# Patient Record
Sex: Female | Born: 1975
Health system: Southern US, Community
[De-identification: ages and names within clinical notes are randomized; demographics above are authoritative.]

## PROBLEM LIST (undated history)

## (undated) DIAGNOSIS — H269 Unspecified cataract: Secondary | ICD-10-CM

## (undated) DIAGNOSIS — K589 Irritable bowel syndrome without diarrhea: Secondary | ICD-10-CM

## (undated) DIAGNOSIS — L309 Dermatitis, unspecified: Secondary | ICD-10-CM

## (undated) DIAGNOSIS — Z923 Personal history of irradiation: Secondary | ICD-10-CM

## (undated) DIAGNOSIS — E739 Lactose intolerance, unspecified: Secondary | ICD-10-CM

## (undated) DIAGNOSIS — H5010 Unspecified exotropia: Secondary | ICD-10-CM

## (undated) HISTORY — PX: WISDOM TOOTH EXTRACTION: SHX21

## (undated) HISTORY — DX: Unspecified cataract: H26.9

## (undated) HISTORY — DX: Lactose intolerance, unspecified: E73.9

---
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
## 1999-02-09 ENCOUNTER — Encounter: Payer: Self-pay | Admitting: Emergency Medicine

## 1999-02-09 ENCOUNTER — Emergency Department (HOSPITAL_COMMUNITY): Admission: EM | Admit: 1999-02-09 | Discharge: 1999-02-09 | Payer: Self-pay

## 1999-09-21 ENCOUNTER — Other Ambulatory Visit: Admission: RE | Admit: 1999-09-21 | Discharge: 1999-09-21 | Payer: Self-pay | Admitting: Obstetrics and Gynecology

## 2000-05-10 ENCOUNTER — Encounter: Payer: Self-pay | Admitting: Emergency Medicine

## 2000-05-10 ENCOUNTER — Emergency Department (HOSPITAL_COMMUNITY): Admission: EM | Admit: 2000-05-10 | Discharge: 2000-05-10 | Payer: Self-pay | Admitting: Emergency Medicine

## 2000-10-12 ENCOUNTER — Emergency Department (HOSPITAL_COMMUNITY): Admission: EM | Admit: 2000-10-12 | Discharge: 2000-10-12 | Payer: Self-pay | Admitting: Emergency Medicine

## 2002-01-09 ENCOUNTER — Emergency Department (HOSPITAL_COMMUNITY): Admission: EM | Admit: 2002-01-09 | Discharge: 2002-01-09 | Payer: Self-pay | Admitting: Emergency Medicine

## 2002-01-09 ENCOUNTER — Encounter: Payer: Self-pay | Admitting: Emergency Medicine

## 2002-05-12 ENCOUNTER — Encounter: Payer: Self-pay | Admitting: Emergency Medicine

## 2002-05-12 ENCOUNTER — Emergency Department (HOSPITAL_COMMUNITY): Admission: EM | Admit: 2002-05-12 | Discharge: 2002-05-12 | Payer: Self-pay | Admitting: Emergency Medicine

## 2002-08-29 ENCOUNTER — Ambulatory Visit (HOSPITAL_COMMUNITY): Admission: RE | Admit: 2002-08-29 | Discharge: 2002-08-29 | Payer: Self-pay | Admitting: Obstetrics & Gynecology

## 2002-08-29 ENCOUNTER — Encounter: Payer: Self-pay | Admitting: Obstetrics & Gynecology

## 2002-10-03 ENCOUNTER — Inpatient Hospital Stay (HOSPITAL_COMMUNITY): Admission: AD | Admit: 2002-10-03 | Discharge: 2002-10-04 | Payer: Self-pay | Admitting: *Deleted

## 2002-10-03 ENCOUNTER — Encounter: Payer: Self-pay | Admitting: Obstetrics

## 2002-10-11 ENCOUNTER — Inpatient Hospital Stay (HOSPITAL_COMMUNITY): Admission: AD | Admit: 2002-10-11 | Discharge: 2002-10-11 | Payer: Self-pay | Admitting: Obstetrics

## 2002-10-12 ENCOUNTER — Inpatient Hospital Stay (HOSPITAL_COMMUNITY): Admission: AD | Admit: 2002-10-12 | Discharge: 2002-10-12 | Payer: Self-pay | Admitting: Obstetrics

## 2002-11-29 ENCOUNTER — Encounter: Payer: Self-pay | Admitting: Obstetrics & Gynecology

## 2002-11-29 ENCOUNTER — Inpatient Hospital Stay (HOSPITAL_COMMUNITY): Admission: RE | Admit: 2002-11-29 | Discharge: 2002-12-03 | Payer: Self-pay | Admitting: Obstetrics & Gynecology

## 2002-12-01 ENCOUNTER — Encounter (INDEPENDENT_AMBULATORY_CARE_PROVIDER_SITE_OTHER): Payer: Self-pay | Admitting: Specialist

## 2002-12-19 ENCOUNTER — Emergency Department (HOSPITAL_COMMUNITY): Admission: EM | Admit: 2002-12-19 | Discharge: 2002-12-20 | Payer: Self-pay | Admitting: *Deleted

## 2005-01-17 ENCOUNTER — Inpatient Hospital Stay (HOSPITAL_COMMUNITY): Admission: AD | Admit: 2005-01-17 | Discharge: 2005-01-19 | Payer: Self-pay | Admitting: Obstetrics & Gynecology

## 2005-01-17 ENCOUNTER — Encounter (INDEPENDENT_AMBULATORY_CARE_PROVIDER_SITE_OTHER): Payer: Self-pay | Admitting: Specialist

## 2005-01-17 ENCOUNTER — Ambulatory Visit: Payer: Self-pay | Admitting: Obstetrics & Gynecology

## 2006-05-20 ENCOUNTER — Encounter (INDEPENDENT_AMBULATORY_CARE_PROVIDER_SITE_OTHER): Payer: Self-pay | Admitting: *Deleted

## 2006-05-20 ENCOUNTER — Ambulatory Visit (HOSPITAL_COMMUNITY): Admission: RE | Admit: 2006-05-20 | Discharge: 2006-05-20 | Payer: Self-pay | Admitting: Obstetrics

## 2006-05-20 HISTORY — PX: LEEP: SHX91

## 2009-01-22 ENCOUNTER — Encounter: Payer: Self-pay | Admitting: Gastroenterology

## 2009-01-29 ENCOUNTER — Encounter: Payer: Self-pay | Admitting: Gastroenterology

## 2009-01-29 ENCOUNTER — Ambulatory Visit (HOSPITAL_COMMUNITY): Admission: RE | Admit: 2009-01-29 | Discharge: 2009-01-29 | Payer: Self-pay | Admitting: Obstetrics

## 2009-02-10 DIAGNOSIS — N83209 Unspecified ovarian cyst, unspecified side: Secondary | ICD-10-CM | POA: Insufficient documentation

## 2009-02-10 DIAGNOSIS — K5900 Constipation, unspecified: Secondary | ICD-10-CM | POA: Insufficient documentation

## 2009-02-11 ENCOUNTER — Ambulatory Visit: Payer: Self-pay | Admitting: Gastroenterology

## 2009-02-11 DIAGNOSIS — E739 Lactose intolerance, unspecified: Secondary | ICD-10-CM | POA: Insufficient documentation

## 2009-02-11 DIAGNOSIS — R1084 Generalized abdominal pain: Secondary | ICD-10-CM | POA: Insufficient documentation

## 2009-02-11 DIAGNOSIS — K589 Irritable bowel syndrome without diarrhea: Secondary | ICD-10-CM | POA: Insufficient documentation

## 2009-02-11 LAB — CONVERTED CEMR LAB
ALT: 23 units/L (ref 0–35)
AST: 20 units/L (ref 0–37)
Basophils Absolute: 0.1 10*3/uL (ref 0.0–0.1)
Basophils Relative: 0.9 % (ref 0.0–3.0)
Bilirubin, Direct: 0 mg/dL (ref 0.0–0.3)
CO2: 28 meq/L (ref 19–32)
Chloride: 109 meq/L (ref 96–112)
Eosinophils Absolute: 0.5 10*3/uL (ref 0.0–0.7)
Hemoglobin: 13.1 g/dL (ref 12.0–15.0)
Iron: 59 ug/dL (ref 42–145)
Lymphocytes Relative: 20.6 % (ref 12.0–46.0)
MCHC: 34.3 g/dL (ref 30.0–36.0)
Monocytes Relative: 7.3 % (ref 3.0–12.0)
Neutro Abs: 4.8 10*3/uL (ref 1.4–7.7)
Neutrophils Relative %: 65 % (ref 43.0–77.0)
Potassium: 4 meq/L (ref 3.5–5.1)
RBC: 4.06 M/uL (ref 3.87–5.11)
Saturation Ratios: 17.7 % — ABNORMAL LOW (ref 20.0–50.0)
Sed Rate: 12 mm/hr (ref 0–22)
Total Bilirubin: 0.8 mg/dL (ref 0.3–1.2)
Total Protein: 7.4 g/dL (ref 6.0–8.3)
Transferrin: 237.7 mg/dL (ref 212.0–360.0)

## 2009-02-19 ENCOUNTER — Ambulatory Visit: Payer: Self-pay | Admitting: Gastroenterology

## 2010-07-02 ENCOUNTER — Inpatient Hospital Stay (HOSPITAL_COMMUNITY)
Admission: AD | Admit: 2010-07-02 | Discharge: 2010-07-02 | Payer: Self-pay | Source: Home / Self Care | Attending: Obstetrics | Admitting: Obstetrics

## 2010-07-06 ENCOUNTER — Encounter: Payer: Self-pay | Admitting: Obstetrics

## 2010-07-06 LAB — CBC
HCT: 36.7 % (ref 36.0–46.0)
MCH: 30.3 pg (ref 26.0–34.0)
MCHC: 34.1 g/dL (ref 30.0–36.0)
MCV: 88.9 fL (ref 78.0–100.0)
Platelets: 258 10*3/uL (ref 150–400)
RDW: 13.2 % (ref 11.5–15.5)

## 2010-07-06 LAB — URINE MICROSCOPIC-ADD ON

## 2010-07-06 LAB — RAPID URINE DRUG SCREEN, HOSP PERFORMED
Amphetamines: NOT DETECTED
Opiates: NOT DETECTED
Tetrahydrocannabinol: POSITIVE — AB

## 2010-07-06 LAB — URINALYSIS, ROUTINE W REFLEX MICROSCOPIC
Ketones, ur: NEGATIVE mg/dL
Leukocytes, UA: NEGATIVE
Nitrite: NEGATIVE
Specific Gravity, Urine: 1.02 (ref 1.005–1.030)
Urine Glucose, Fasting: NEGATIVE mg/dL
pH: 7 (ref 5.0–8.0)

## 2010-07-06 LAB — HCG, QUANTITATIVE, PREGNANCY: hCG, Beta Chain, Quant, S: 48384 m[IU]/mL — ABNORMAL HIGH (ref <5)

## 2010-10-30 NOTE — Op Note (Signed)
NAMESHAYLEY, MEDLIN               ACCOUNT NO.:  [id number]   MEDICAL RECORD NO.:  [id number]          PATIENT TYPE:  AMB   LOCATION:  SDC                           FACILITY:  [facility]   PHYSICIAN:  Charles A. Clearance Coots, M.D.DATE OF BIRTH:  [birth year]   DATE OF PROCEDURE:  05/20/2006  DATE OF DISCHARGE:                               OPERATIVE REPORT   PREOPERATIVE DIAGNOSIS:  High-grade squamous intraepithelial lesion  (HGSIL).   POSTOPERATIVE DIAGNOSIS:  High-grade squamous intraepithelial lesion  (HGSIL).   PROCEDURE:  LEEP.   SURGEON:  Coral Ceo.   ANESTHESIA:  General.   ESTIMATED BLOOD LOSS:  Minimal.   COMPLICATIONS:  None.   SPECIMEN:  Cervical cone.   OPERATION:  The patient was brought to the operating room, and after  satisfactory general endotracheal anesthesia, the vagina was prepped and  draped in the usual sterile fashion.  The urinary bladder was emptied of  approximately 25 mL of clear urine.  A sterile LEEP-type coated speculum  was inserted in the vaginal vault, and cervix was isolated.  The  sidewall retractors were also used to further isolate the cervix.  The  cervix was then injected with a mixture of 2% Xylocaine and 100,000  epinephrine, approximately 20 mL throughout the stroma of the cervix.  An appropriate loop was then chosen, and a LEEP conization was performed  with two specimens, upper and lower, that was submitted to pathology for  evaluation.  The base of the conization was then cauterized with ball-  tip electrode, and the edges of the conization were feathered with the  ball-tip electrode to prevent stenosis. There was no active bleeding at  the conclusion of the procedure.  All instruments were retired.  The  patient tolerated the procedure well and was transported to the recovery  room in satisfactory condition.      Charles A. Clearance Coots, M.D.  Electronically Signed     CAH/MEDQ  D:  05/20/2006  T:  05/20/2006  Job:  [phone] [id number]

## 2011-01-24 ENCOUNTER — Inpatient Hospital Stay (INDEPENDENT_AMBULATORY_CARE_PROVIDER_SITE_OTHER)
Admission: RE | Admit: 2011-01-24 | Discharge: 2011-01-24 | Disposition: A | Discharge status: Home / Self Care | Payer: Self-pay | Source: Ambulatory Visit | Attending: Family Medicine | Admitting: Family Medicine

## 2011-01-24 DIAGNOSIS — N390 Urinary tract infection, site not specified: Secondary | ICD-10-CM

## 2011-01-24 LAB — POCT URINALYSIS DIP (DEVICE)
Bilirubin Urine: NEGATIVE
Glucose, UA: NEGATIVE mg/dL
Nitrite: POSITIVE — AB
Urobilinogen, UA: 0.2 mg/dL (ref 0.0–1.0)

## 2011-08-30 ENCOUNTER — Encounter (HOSPITAL_COMMUNITY): Payer: Self-pay | Admitting: *Deleted

## 2011-08-30 ENCOUNTER — Emergency Department (INDEPENDENT_AMBULATORY_CARE_PROVIDER_SITE_OTHER)
Admission: EM | Admit: 2011-08-30 | Discharge: 2011-08-30 | Disposition: A | Discharge status: Home / Self Care | Payer: Medicaid Other | Source: Home / Self Care | Attending: Emergency Medicine | Admitting: Emergency Medicine

## 2011-08-30 DIAGNOSIS — J039 Acute tonsillitis, unspecified: Secondary | ICD-10-CM

## 2011-08-30 LAB — POCT RAPID STREP A: Streptococcus, Group A Screen (Direct): NEGATIVE

## 2011-08-30 MED ORDER — IBUPROFEN 800 MG PO TABS
ORAL_TABLET | ORAL | Status: AC
  Filled 2011-08-30: qty 1

## 2011-08-30 MED ORDER — PREDNISONE 5 MG PO KIT: 1.0000 | PACK | Freq: Every day | ORAL | Status: DC

## 2011-08-30 MED ORDER — TRAMADOL HCL 50 MG PO TABS: 100.0000 mg | ORAL_TABLET | Freq: Three times a day (TID) | ORAL | Status: AC | PRN

## 2011-08-30 MED ORDER — AZITHROMYCIN 500 MG PO TABS: 500.0000 mg | ORAL_TABLET | Freq: Every day | ORAL | Status: AC

## 2011-08-30 MED ORDER — IBUPROFEN 800 MG PO TABS
800.0000 mg | ORAL_TABLET | Freq: Once | ORAL | Status: AC
  Administered 2011-08-30: 800 mg via ORAL

## 2011-08-30 NOTE — Discharge Instructions (Signed)
Tonsillitis Tonsils are lumps of lymphoid tissues at the back of the throat. Each tonsil has 20 crevices (crypts). Tonsils help fight nose and throat infections and keep infection from spreading to other parts of the body for the first 18 months of life. Tonsillitis is an infection of the throat that causes the tonsils to become red, tender, and swollen. CAUSES Sudden and, if treated, temporary (acute) tonsillitis is usually caused by infection with streptococcal bacteria. Long lasting (chronic) tonsillitis occurs when the crypts of the tonsils become filled with pieces of food and bacteria, which makes it easy for the tonsils to become constantly infected. SYMPTOMS  Symptoms of tonsillitis include:  A sore throat.   White patches on the tonsils.   Fever.   Tiredness.  DIAGNOSIS Tonsillitis can be diagnosed through a physical exam. Diagnosis can be confirmed with the results of lab tests, including a throat culture. TREATMENT  The goals of tonsillitis treatment include the reduction of the severity and duration of symptoms, prevention of associated conditions, and prevention of disease transmission. Tonsillitis caused by bacteria can be treated with antibiotics. Usually, treatment with antibiotics is started before the cause of the tonsillitis is known. However, if it is determined that the cause is not bacterial, antibiotics will not treat the tonsillitis. If attacks of tonsillitis are severe and frequent, your caregiver may recommend surgery to remove the tonsils (tonsillectomy). HOME CARE INSTRUCTIONS   Rest as much as possible and get plenty of sleep.   Drink plenty of fluids. While the throat is very sore, eat soft foods or liquids, such as sherbet, soups, or instant breakfast drinks.   Eat frozen ice pops.   Older children and adults may gargle with a warm or cold liquid to help soothe the throat. Mix 1 teaspoon of salt in 1 cup of water.   Other family members who also develop a  sore throat or fever should have a medical exam or throat culture.   Only take over-the-counter or prescription medicines for pain, discomfort, or fever as directed by your caregiver.   If you are given antibiotics, take them as directed. Finish them even if you start to feel better.  SEEK MEDICAL CARE IF:   Your baby is older than 3 months with a rectal temperature of 100.5 F (38.1 C) or higher for more than 1 day.   Large, tender lumps develop in your neck.   A rash develops.   Green, yellow-brown, or bloody substance is coughed up.   You are unable to swallow liquids or food for 24 hours.   Your child is unable to swallow food or liquids for 12 hours.  SEEK IMMEDIATE MEDICAL CARE IF:   You develop any new symptoms such as vomiting, severe headache, stiff neck, chest pain, or trouble breathing or swallowing.   You have severe throat pain along with drooling or voice changes.   You have severe pain, unrelieved with recommended medications.   You are unable to fully open the mouth.   You develop redness, swelling, or severe pain anywhere in the neck.   You have a fever.   Your baby is older than 3 months with a rectal temperature of 102 F (38.9 C) or higher.   Your baby is 12 months old or younger with a rectal temperature of 100.4 F (38 C) or higher.  MAKE SURE YOU:   Understand these instructions.   Will watch your condition.   Will get help right away if you are not  watch your condition.   Will get help right away if you are not doing well or get worse.  Document Released: 03/10/2005 Document Revised: 05/20/2011 Document Reviewed: 08/06/2010  [employer] Patient Information 2012 [employer].

## 2011-08-30 NOTE — ED Notes (Signed)
Pt  Has  Symptoms  Of a  sorethroat  With  Cough/  Congested  As  Well as  Body  Aches     Symptoms  X  2  Days   Attempted  To  Go to work  Today  But  Unable  She  States

## 2011-08-30 NOTE — ED Provider Notes (Signed)
Chief Complaint  Patient presents with  . Sore Throat    History of Present Illness:   Abigail Clark is a 36 year old female who has had a three-day history of severe sore throat with radiation the pain in her right ear, chills, myalgias, aching in her joints, headache, nasal congestion, cough productive of some yellow sputum, and some loose stools. She denies any specific exposure to strep or to mono. She has had no prior history of strep or tonsillitis in the past.  Review of Systems:  Other than noted above, the patient denies any of the following symptoms. Systemic:  No fever, chills, sweats, fatigue, myalgias, headache, or anorexia. Eye:  No redness, pain or drainage. ENT:  No earache, nasal congestion, rhinorrhea, sinus pressure, or sore throat. Lungs:  No cough, sputum production, wheezing, shortness of breath. Or chest pain. GI:  No nausea, vomiting, abdominal pain or diarrhea. Skin:  No rash or itching.  PMFSH:  Past medical history, family history, social history, meds, and allergies were reviewed.  Physical Exam:   Vital signs:  BP 115/83  Pulse 78  Temp(Src) 99.9 F (37.7 C) (Oral)  Resp 16  SpO2 100%  LMP 08/12/2011 General:  Alert, in significant distress due to sore throat. Eye:  No conjunctival injection or drainage. ENT:  TMs and canals were normal, without erythema or inflammation.  Nasal mucosa was clear and uncongested, without drainage.  Mucous membranes were moist.  Tonsils were enlarged and red with some spots of whitish exudate.  There were no oral ulcerations or lesions. Neck:  Supple, no adenopathy, tenderness or mass. Lungs:  No respiratory distress.  Lungs were clear to auscultation, without wheezes, rales or rhonchi.  Breath sounds were clear and equal bilaterally. Heart:  Regular rhythm, without gallops, murmers or rubs. Skin:  Clear, warm, and dry, without rash or lesions.  Labs:   Results for orders placed during the hospital encounter of 08/30/11  POCT RAPID  STREP A ([facility] ONLY)      Component Value Range   Streptococcus, Group A Screen (Direct) NEGATIVE  NEGATIVE     Course in Urgent Care Center:   She was given ibuprofen 800 mg by mouth since she was going to be driving home. She tolerated this well without any immediate side effects.  Assessment:   Diagnoses that have been ruled out:  None  Diagnoses that are still under consideration:  None  Final diagnoses:  Tonsillitis      Plan:   1.  The following meds were prescribed:   New Prescriptions   AZITHROMYCIN (ZITHROMAX) 500 MG TABLET    Take 1 tablet (500 mg total) by mouth daily.   PREDNISONE 5 MG KIT    Take 1 kit (5 mg total) by mouth daily after breakfast. Prednisone 5 mg 6 day dosepack.  Take as directed.   TRAMADOL (ULTRAM) 50 MG TABLET    Take 2 tablets (100 mg total) by mouth every 8 (eight) hours as needed for pain.   2.  The patient was instructed in symptomatic care and handouts were given. 3.  The patient was told to return if becoming worse in any way, if no better in 3 or 4 days, and given some red flag symptoms that would indicate earlier return.   Reuben Likes, MD 08/30/11 [phone]

## 2011-09-03 ENCOUNTER — Encounter (HOSPITAL_COMMUNITY): Payer: Self-pay

## 2011-09-03 ENCOUNTER — Emergency Department (HOSPITAL_COMMUNITY)
Admission: EM | Admit: 2011-09-03 | Discharge: 2011-09-03 | Disposition: A | Discharge status: Home / Self Care | Payer: Medicaid Other | Source: Home / Self Care | Attending: Family Medicine | Admitting: Family Medicine

## 2011-09-03 DIAGNOSIS — J069 Acute upper respiratory infection, unspecified: Secondary | ICD-10-CM

## 2011-09-03 MED ORDER — FLUTICASONE PROPIONATE 50 MCG/ACT NA SUSP: 2.0000 | Freq: Every day | NASAL | Status: AC

## 2011-09-03 NOTE — Discharge Instructions (Signed)
Finish prednisone, use mucinex and nose spray as prescribed, drink plenty of fluids,return as needed.

## 2011-09-03 NOTE — ED Notes (Signed)
C/o HA, mostly frontal, and some left posterior; recheck of syx from 3-18 [facility] visit

## 2011-09-03 NOTE — ED Provider Notes (Signed)
History     CSN: [id number]  Arrival date & time 09/03/11  1033   First MD Initiated Contact with Patient 09/03/11 1104      Chief Complaint  Patient presents with  . Fever    (Consider location/radiation/quality/duration/timing/severity/associated sxs/prior treatment) Patient is a 36 y.o. female presenting with fever. The history is provided by the patient.  Fever Primary symptoms of the febrile illness include fever, headaches and cough. Primary symptoms do not include nausea, vomiting or diarrhea. The current episode started 3 to 5 days ago. This is a new problem. The problem has been gradually improving. Primary symptoms comment: seen 3/18 and given meds, pt anxious about questionable improvement.    History reviewed. No pertinent past medical history.  Past Surgical History  Procedure Date  . Eye surgery     History reviewed. No pertinent family history.  History  Substance Use Topics  . Smoking status: Current Everyday Smoker  . Smokeless tobacco: Not on file  . Alcohol Use: No    OB History    Grav Para Term Preterm Abortions TAB SAB Ect Mult Living                  Review of Systems  Constitutional: Positive for fever.  HENT: Positive for congestion and rhinorrhea. Negative for sore throat.   Respiratory: Positive for cough.   Gastrointestinal: Negative for nausea, vomiting and diarrhea.  Musculoskeletal: Negative.   Skin: Negative.   Neurological: Positive for headaches.    Allergies  Penicillins  Home Medications   Current Outpatient Rx  Name Route Sig Dispense Refill  . AZITHROMYCIN 500 MG PO TABS Oral Take 1 tablet (500 mg total) by mouth daily. 5 tablet 0  . MUCINEX DM PO Oral Take by mouth.    Marland Kitchen FLUTICASONE PROPIONATE 50 MCG/ACT NA SUSP Nasal Place 2 sprays into the nose daily. 1 g 2  . PREDNISONE 5 MG PO KIT Oral Take 1 kit (5 mg total) by mouth daily after breakfast. Prednisone 5 mg 6 day dosepack.  Take as directed. 1 kit 0  . TRAMADOL  HCL 50 MG PO TABS Oral Take 2 tablets (100 mg total) by mouth every 8 (eight) hours as needed for pain. 30 tablet 0    BP 113/68  Pulse 76  Temp(Src) 98.3 F (36.8 C) (Oral)  Resp 14  SpO2 100%  LMP 08/12/2011  Physical Exam  Nursing note and vitals reviewed. Constitutional: She is oriented to person, place, and time. She appears well-developed and well-nourished.  HENT:  Head: Normocephalic.  Right Ear: External ear normal.  Left Ear: External ear normal.  Nose: Nose normal.  Mouth/Throat: Oropharynx is clear and moist.  Eyes: Pupils are equal, round, and reactive to light.  Neck: Normal range of motion. Neck supple.  Cardiovascular: Normal rate and normal heart sounds.   Pulmonary/Chest: Effort normal and breath sounds normal.  Lymphadenopathy:    She has no cervical adenopathy.  Neurological: She is alert and oriented to person, place, and time.  Skin: Skin is warm and dry. No rash noted.    ED Course  Procedures (including critical care time)  Labs Reviewed - No data to display No results found.   1. URI (upper respiratory infection)       MDM         Linna Hoff, MD 09/03/11 1217

## 2012-04-27 ENCOUNTER — Emergency Department (INDEPENDENT_AMBULATORY_CARE_PROVIDER_SITE_OTHER)
Admission: EM | Admit: 2012-04-27 | Discharge: 2012-04-27 | Disposition: A | Discharge status: Home / Self Care | Payer: Self-pay | Source: Home / Self Care

## 2012-04-27 ENCOUNTER — Emergency Department (INDEPENDENT_AMBULATORY_CARE_PROVIDER_SITE_OTHER): Payer: Worker's Compensation

## 2012-04-27 ENCOUNTER — Encounter (HOSPITAL_COMMUNITY): Payer: Self-pay | Admitting: Emergency Medicine

## 2012-04-27 DIAGNOSIS — S82143A Displaced bicondylar fracture of unspecified tibia, initial encounter for closed fracture: Secondary | ICD-10-CM

## 2012-04-27 DIAGNOSIS — S82109A Unspecified fracture of upper end of unspecified tibia, initial encounter for closed fracture: Secondary | ICD-10-CM

## 2012-04-27 MED ORDER — KETOROLAC TROMETHAMINE 60 MG/2ML IM SOLN
60.0000 mg | Freq: Once | INTRAMUSCULAR | Status: AC
  Administered 2012-04-27: 60 mg via INTRAMUSCULAR

## 2012-04-27 MED ORDER — HYDROMORPHONE HCL PF 1 MG/ML IJ SOLN
INTRAMUSCULAR | Status: AC
  Filled 2012-04-27: qty 1

## 2012-04-27 MED ORDER — KETOROLAC TROMETHAMINE 60 MG/2ML IM SOLN
INTRAMUSCULAR | Status: AC
  Filled 2012-04-27: qty 2

## 2012-04-27 MED ORDER — OXYCODONE-ACETAMINOPHEN 10-325 MG PO TABS: 1.0000 | ORAL_TABLET | ORAL | Status: DC | PRN

## 2012-04-27 MED ORDER — ONDANSETRON HCL 4 MG/2ML IJ SOLN
4.0000 mg | Freq: Once | INTRAMUSCULAR | Status: AC
  Administered 2012-04-27: 4 mg via INTRAMUSCULAR

## 2012-04-27 MED ORDER — HYDROMORPHONE HCL PF 1 MG/ML IJ SOLN
1.0000 mg | Freq: Once | INTRAMUSCULAR | Status: AC
  Administered 2012-04-27: 1 mg via INTRAMUSCULAR

## 2012-04-27 MED ORDER — ONDANSETRON HCL 4 MG/2ML IJ SOLN
INTRAMUSCULAR | Status: AC
  Filled 2012-04-27: qty 2

## 2012-04-27 NOTE — ED Provider Notes (Signed)
Medical screening examination/treatment/procedure(s) were performed by resident physician or non-physician practitioner and as supervising physician I was immediately available for consultation/collaboration.   Barkley Bruns MD.    Linna Hoff, MD 04/27/12 2124

## 2012-04-27 NOTE — ED Provider Notes (Signed)
History     CSN: [id number]  Arrival date & time 04/27/12  1557   None     Chief Complaint  Patient presents with  . Knee Injury    injured right knee playing football around 12 p.m today    (Consider location/radiation/quality/duration/timing/severity/associated sxs/prior treatment) HPI Comments: 36 year old female was playing touch football around noon today and she stopped suddenly injuring her knee and fell to the ground. She is complaining of severe pain in the right knee all areas anterior laterally and medially as well as pain in the tibia and anterior tibialis muscle. She is unable to extend or flex her knee due to pain. She is tender in all areas of the knee and most of the lower leg. She denies other injury.   History reviewed. No pertinent past medical history.  Past Surgical History  Procedure Date  . Eye surgery     History reviewed. No pertinent family history.  History  Substance Use Topics  . Smoking status: Current Every Day Smoker  . Smokeless tobacco: Not on file  . Alcohol Use: No    OB History    Grav Para Term Preterm Abortions TAB SAB Ect Mult Living                  Review of Systems  Constitutional: Negative for fever, chills and activity change.  HENT: Negative.   Respiratory: Negative.   Cardiovascular: Negative.   Musculoskeletal:       As per HPI  Skin: Negative for color change, pallor and rash.  Neurological: Negative.     Allergies  Penicillins  Home Medications   Current Outpatient Rx  Name  Route  Sig  Dispense  Refill  . MUCINEX DM PO   Oral   Take by mouth.         Marland Kitchen FLUTICASONE PROPIONATE 50 MCG/ACT NA SUSP   Nasal   Place 2 sprays into the nose daily.   1 g   2   . OXYCODONE-ACETAMINOPHEN 10-325 MG PO TABS   Oral   Take 1 tablet by mouth every 4 (four) hours as needed for pain.   25 tablet   0   . PREDNISONE 5 MG PO KIT   Oral   Take 1 kit (5 mg total) by mouth daily after breakfast. Prednisone 5 mg  6 day dosepack.  Take as directed.   1 kit   0     BP 118/80  Pulse 85  Temp 98.2 F (36.8 C) (Oral)  Resp 20  SpO2 98%  LMP 04/14/2012  Physical Exam  Constitutional: She is oriented to person, place, and time. She appears well-developed and well-nourished. No distress.  HENT:  Head: Normocephalic and atraumatic.  Eyes: EOM are normal. Pupils are equal, round, and reactive to light.  Neck: Normal range of motion. Neck supple.  Musculoskeletal:       Exam the right knee is quite limited due to pain. She withdrawals and moves her hand out of the way when I attempt to palpate. There is mild to moderate edema in the anterior, medial and lateral aspect of the knee although it is not tense. There is also tenderness in these areas however palpation again was limited due to patient's withdrawal and exhibiting a good exam. Distal neurovascular and sensory is intact.  Lymphadenopathy:    She has no cervical adenopathy.  Neurological: She is alert and oriented to person, place, and time. No cranial nerve deficit.  Skin: Skin  is warm and dry.  Psychiatric: She has a normal mood and affect.    ED Course  Procedures (including critical care time)  Labs Reviewed - No data to display Dg Knee Complete 4 Views Right  04/27/2012  *RADIOLOGY REPORT*  Clinical Data: Right knee pain secondary to an injury playing football today.  Patient cannot bear weight.  RIGHT KNEE - COMPLETE 4+ VIEW  Comparison: None.  Findings: There is a fracture of the posterior aspect of the lateral tibial plateau with minimal depression.  There is a prominent joint effusion.  The other bones are intact.  IMPRESSION: Slightly depressed posterior lateral tibial plateau fracture.   Original Report Authenticated By: Francene Boyers, M.D.      1. Fracture of tibial plateau, closed       MDM  Spoke with the physician assistant for Dr. Wyline Mood. The PA call Dr. Wyline Mood and per Dr. Wyline Mood instructions we will place the patient  in a long knee immobilizer; she is to have no weight-bearing and use crutches. Dilaudid 1 mg IM now  Zofran 4 mg IM Percocet 10/325 one every 4-6 hours when necessary pain Sedation precautions were given to the patient and the patient's husband. Call Dr. Jonetta Osgood office tomorrow for an appointment early next week.         Hayden Rasmussen, NP 04/27/12 Windell Moment

## 2012-04-27 NOTE — ED Notes (Signed)
Pt c/o right knee pain all over. Pt states that she was playing football today around 12 p.m on artificial turf and came to complete sudden stop while body still in motion when falling to the ground

## 2012-12-26 ENCOUNTER — Ambulatory Visit (INDEPENDENT_AMBULATORY_CARE_PROVIDER_SITE_OTHER): Payer: BC Managed Care – PPO | Admitting: Obstetrics

## 2012-12-26 ENCOUNTER — Other Ambulatory Visit: Payer: Self-pay

## 2012-12-26 ENCOUNTER — Encounter: Payer: Self-pay | Admitting: Obstetrics

## 2012-12-26 VITALS — BP 97/69 | HR 65 | Ht 66.5 in | Wt 195.0 lb

## 2012-12-26 DIAGNOSIS — Z01419 Encounter for gynecological examination (general) (routine) without abnormal findings: Secondary | ICD-10-CM

## 2012-12-26 DIAGNOSIS — B373 Candidiasis of vulva and vagina: Secondary | ICD-10-CM | POA: Insufficient documentation

## 2012-12-26 DIAGNOSIS — N760 Acute vaginitis: Secondary | ICD-10-CM | POA: Insufficient documentation

## 2012-12-26 DIAGNOSIS — A499 Bacterial infection, unspecified: Secondary | ICD-10-CM

## 2012-12-26 DIAGNOSIS — B9689 Other specified bacterial agents as the cause of diseases classified elsewhere: Secondary | ICD-10-CM

## 2012-12-26 DIAGNOSIS — B3731 Acute candidiasis of vulva and vagina: Secondary | ICD-10-CM

## 2012-12-26 DIAGNOSIS — N644 Mastodynia: Secondary | ICD-10-CM | POA: Insufficient documentation

## 2012-12-26 MED ORDER — FLUCONAZOLE 150 MG PO TABS: 150.0000 mg | ORAL_TABLET | Freq: Once | ORAL | Status: DC

## 2012-12-26 MED ORDER — METRONIDAZOLE 500 MG PO TABS: 500.0000 mg | ORAL_TABLET | Freq: Two times a day (BID) | ORAL | Status: DC

## 2012-12-26 NOTE — Progress Notes (Signed)
Subjective:     Abigail Clark is a 37 y.o. female here for a routine annual exam.  Current complaints: pain and tenderness in her right breast. Pt states the right breast seems to be larger than the left breast. Pt states she noticed this about 6 months ago. Pt states the pain comes and goes.  Personal health questionnaire reviewed: yes.   Gynecologic History Patient's last menstrual period was 12/04/2012. Contraception: condoms Last Pap: 2013. Results were: normal Last mammogram: n/a. Results were: n/a  Obstetric History OB History   Grav Para Term Preterm Abortions TAB SAB Ect Mult Living                   The following portions of the patient's history were reviewed and updated as appropriate: allergies, current medications, past family history, past medical history, past social history, past surgical history and problem list.  Review of Systems Pertinent items are noted in HPI.    Objective:    Breasts: Inspection negative, No nipple retraction or dimpling, No nipple discharge or bleeding, No axillary or supraclavicular adenopathy, Normal to palpation without dominant masses, positive findings: Tenderness at 9 o'clock, right breast.    Assessment:   Healthy female exam  Breast tenderness.   Plan:    Education reviewed: self breast exams. Mammogram ordered. Follow up in: prn as needed.

## 2012-12-26 NOTE — Addendum Note (Signed)
Addended by: George Hugh on: 12/26/2012 04:05 PM   Modules accepted: Orders

## 2012-12-27 LAB — WET PREP BY MOLECULAR PROBE
Candida species: NEGATIVE
Trichomonas vaginosis: NEGATIVE

## 2012-12-27 LAB — GC/CHLAMYDIA PROBE AMP
CT Probe RNA: NEGATIVE
GC Probe RNA: NEGATIVE

## 2013-01-05 ENCOUNTER — Ambulatory Visit
Admission: RE | Admit: 2013-01-05 | Discharge: 2013-01-05 | Disposition: A | Discharge status: Home / Self Care | Payer: Worker's Compensation | Source: Ambulatory Visit | Attending: Obstetrics | Admitting: Obstetrics

## 2013-01-05 DIAGNOSIS — N644 Mastodynia: Secondary | ICD-10-CM

## 2013-01-09 ENCOUNTER — Ambulatory Visit: Payer: Self-pay | Admitting: Obstetrics

## 2014-01-10 ENCOUNTER — Ambulatory Visit: Payer: BC Managed Care – PPO | Admitting: Obstetrics

## 2014-01-24 ENCOUNTER — Other Ambulatory Visit: Payer: Self-pay | Admitting: Obstetrics

## 2014-01-24 ENCOUNTER — Ambulatory Visit (INDEPENDENT_AMBULATORY_CARE_PROVIDER_SITE_OTHER): Payer: BC Managed Care – PPO | Admitting: Obstetrics

## 2014-01-24 ENCOUNTER — Encounter: Payer: Self-pay | Admitting: Obstetrics

## 2014-01-24 VITALS — BP 122/76 | HR 76 | Temp 97.6°F | Ht 66.5 in | Wt 191.0 lb

## 2014-01-24 DIAGNOSIS — N390 Urinary tract infection, site not specified: Secondary | ICD-10-CM

## 2014-01-24 DIAGNOSIS — N930 Postcoital and contact bleeding: Secondary | ICD-10-CM

## 2014-01-24 DIAGNOSIS — A499 Bacterial infection, unspecified: Secondary | ICD-10-CM

## 2014-01-24 DIAGNOSIS — B3731 Acute candidiasis of vulva and vagina: Secondary | ICD-10-CM

## 2014-01-24 DIAGNOSIS — N760 Acute vaginitis: Secondary | ICD-10-CM

## 2014-01-24 DIAGNOSIS — B9689 Other specified bacterial agents as the cause of diseases classified elsewhere: Secondary | ICD-10-CM

## 2014-01-24 DIAGNOSIS — B373 Candidiasis of vulva and vagina: Secondary | ICD-10-CM

## 2014-01-24 DIAGNOSIS — Z01419 Encounter for gynecological examination (general) (routine) without abnormal findings: Secondary | ICD-10-CM

## 2014-01-24 MED ORDER — METRONIDAZOLE 500 MG PO TABS: 500.0000 mg | ORAL_TABLET | Freq: Two times a day (BID) | ORAL | Status: DC

## 2014-01-24 NOTE — Progress Notes (Signed)
Subjective:     Abigail Clark is a 38 y.o. female here for a routine exam.  Current complaints: Spotting after intercourse.    Personal health questionnaire:  Is patient Ashkenazi Jewish, have a family history of breast and/or ovarian cancer: no Is there a family history of uterine cancer diagnosed at age < 37, gastrointestinal cancer, urinary tract cancer, family member who is a Field seismologist syndrome-associated carrier: no Is the patient overweight and hypertensive, family history of diabetes, personal history of gestational diabetes or PCOS: no Is patient over 9, have PCOS,  family history of premature CHD under age 57, diabetes, smoke, have hypertension or peripheral artery disease:  no At any time, has a partner hit, kicked or otherwise hurt or frightened you?: no Over the past 2 weeks, have you felt down, depressed or hopeless?: no Over the past 2 weeks, have you felt little interest or pleasure in doing things?:no   Gynecologic History Patient's last menstrual period was 01/17/2014. Contraception: condoms Last Pap: 2014. Results were: normal Last mammogram: n/a. Results were: n/a  Obstetric History OB History  Gravida Para Term Preterm AB SAB TAB Ectopic Multiple Living  4 3 1 2 1     3     # Outcome Date GA Lbr Len/2nd Weight Sex Delivery Anes PTL Lv  4 ABT           3 PRE     F    Y  2 PRE     M    Y  1 TRM     M    Y      History reviewed. No pertinent past medical history.  Past Surgical History  Procedure Laterality Date  . Eye surgery      Current outpatient prescriptions:Multiple Vitamin (MULTIVITAMIN) tablet, Take 1 tablet by mouth daily., Disp: , Rfl: ;  metroNIDAZOLE (FLAGYL) 500 MG tablet, Take 1 tablet (500 mg total) by mouth 2 (two) times daily., Disp: 14 tablet, Rfl: 2 Allergies  Allergen Reactions  . Penicillins     History  Substance Use Topics  . Smoking status: Current Every Day Smoker -- 0.50 packs/day for 10 years  . Smokeless tobacco: Not on file   . Alcohol Use: No    Family History  Problem Relation Age of Onset  . Diabetes Father   . Cancer Maternal Grandmother   . Heart attack Maternal Grandfather   . Cancer Paternal Grandfather       Review of Systems  Constitutional: negative for fatigue and weight loss Respiratory: negative for cough and wheezing Cardiovascular: negative for chest pain, fatigue and palpitations Gastrointestinal: negative for abdominal pain and change in bowel habits Musculoskeletal:negative for myalgias Neurological: negative for gait problems and tremors Behavioral/Psych: negative for abusive relationship, depression Endocrine: negative for temperature intolerance   Genitourinary:negative for abnormal menstrual periods, genital lesions, hot flashes, sexual problems and vaginal discharge Integument/breast: negative for breast lump, breast tenderness, nipple discharge and skin lesion(s)    Objective:       BP 122/76  Pulse 76  Temp(Src) 97.6 F (36.4 C)  Ht 5' 6.5" (1.689 m)  Wt 191 lb (86.637 kg)  BMI 30.37 kg/m2  LMP 01/17/2014 General:   alert  Skin:   no rash or abnormalities  Lungs:   clear to auscultation bilaterally  Heart:   regular rate and rhythm, S1, S2 normal, no murmur, click, rub or gallop  Breasts:   normal without suspicious masses, skin or nipple changes or axillary nodes  Abdomen:  normal findings: no organomegaly, soft, non-tender and no hernia  Pelvis:  External genitalia: normal general appearance Urinary system: urethral meatus normal and bladder without fullness, nontender Vaginal: normal without tenderness, induration or masses Cervix: normal appearance Adnexa: normal bimanual exam Uterus: anteverted and non-tender, normal size   Lab Review Urine pregnancy test Labs reviewed yes Radiologic studies reviewed no    Assessment:    Healthy female exam.   BV  Spotting after intercourse.      Plan:   Will monitor spotting.   Education reviewed: calcium  supplements, low fat, low cholesterol diet, safe sex/STD prevention, self breast exams, smoking cessation and weight bearing exercise. Contraception: condoms. Follow up in: 1 year. Flagyl Rx   Meds ordered this encounter  Medications  . Multiple Vitamin (MULTIVITAMIN) tablet    Sig: Take 1 tablet by mouth daily.  . metroNIDAZOLE (FLAGYL) 500 MG tablet    Sig: Take 1 tablet (500 mg total) by mouth 2 (two) times daily.    Dispense:  14 tablet    Refill:  2   Orders Placed This Encounter  Procedures  . POCT urinalysis dipstick

## 2014-01-25 LAB — WET PREP BY MOLECULAR PROBE
CANDIDA SPECIES: NEGATIVE
Gardnerella vaginalis: POSITIVE — AB
Trichomonas vaginosis: NEGATIVE

## 2014-01-25 LAB — PAP IG AND HPV HIGH-RISK: HPV DNA High Risk: NOT DETECTED

## 2014-01-25 LAB — GC/CHLAMYDIA PROBE AMP
CT PROBE, AMP APTIMA: NEGATIVE
GC Probe RNA: NEGATIVE

## 2014-01-26 ENCOUNTER — Other Ambulatory Visit: Payer: Self-pay | Admitting: Obstetrics

## 2014-01-26 DIAGNOSIS — A5901 Trichomonal vulvovaginitis: Secondary | ICD-10-CM

## 2014-01-26 MED ORDER — METRONIDAZOLE 500 MG PO TABS: 2000.0000 mg | ORAL_TABLET | Freq: Three times a day (TID) | ORAL | Status: DC

## 2014-01-27 LAB — URINE CULTURE

## 2014-01-28 LAB — POCT URINALYSIS DIPSTICK
Bilirubin, UA: NEGATIVE
Blood, UA: NEGATIVE
Glucose, UA: NEGATIVE
KETONES UA: NEGATIVE
LEUKOCYTES UA: NEGATIVE
Nitrite, UA: NEGATIVE
PH UA: 5
PROTEIN UA: NEGATIVE
Spec Grav, UA: 1.015
UROBILINOGEN UA: NEGATIVE

## 2014-01-28 MED ORDER — NITROFURANTOIN MONOHYD MACRO 100 MG PO CAPS: 100.0000 mg | ORAL_CAPSULE | Freq: Two times a day (BID) | ORAL | Status: DC

## 2014-01-28 NOTE — Addendum Note (Signed)
Addended by: Baltazar Najjar A on: 01/28/2014 10:19 AM   Modules accepted: Orders

## 2014-02-06 ENCOUNTER — Other Ambulatory Visit: Payer: Self-pay | Admitting: *Deleted

## 2014-02-06 DIAGNOSIS — R399 Unspecified symptoms and signs involving the genitourinary system: Secondary | ICD-10-CM

## 2014-02-06 MED ORDER — SULFAMETHOXAZOLE-TMP DS 800-160 MG PO TABS: 1.0000 | ORAL_TABLET | Freq: Two times a day (BID) | ORAL | Status: DC

## 2014-02-06 NOTE — Progress Notes (Signed)
Pt was contacted today for follow up on Metronidazole Rx and lab results.  Pt states that she has taken her Rx for BV and was unable to get Macrobid due to cost.  Pt states that she is currently with no insurance and is paying cash for Rx.  Pt made aware that another Rx could be sent in for her that will be more affordable.  Pt made aware that Bactrim DS per protocol will be sent to [facility] of her choice as it is on their $4 medication list.  Pt states understanding.  Pt advised to contact office with any further concerns.

## 2014-04-15 ENCOUNTER — Encounter: Payer: Self-pay | Admitting: Obstetrics

## 2014-06-12 ENCOUNTER — Telehealth: Payer: Self-pay | Admitting: *Deleted

## 2014-06-12 NOTE — Telephone Encounter (Signed)
Pt called to office regarding a referral and lab work. Return call to pt.  No answer, mailbox full.

## 2014-06-19 ENCOUNTER — Telehealth: Payer: Self-pay | Admitting: *Deleted

## 2014-06-19 DIAGNOSIS — Z862 Personal history of diseases of the blood and blood-forming organs and certain disorders involving the immune mechanism: Secondary | ICD-10-CM

## 2014-06-19 NOTE — Telephone Encounter (Signed)
Patient called- 06/19/2014

## 2014-06-19 NOTE — Telephone Encounter (Signed)
Patient states she is planning a transplant procedure and she wants to get a CBC to check her iron level before she see the surgeon ( [place], [place]).

## 2014-06-20 ENCOUNTER — Other Ambulatory Visit: Payer: Self-pay

## 2014-06-20 DIAGNOSIS — Z862 Personal history of diseases of the blood and blood-forming organs and certain disorders involving the immune mechanism: Secondary | ICD-10-CM

## 2014-06-21 LAB — CBC
HEMATOCRIT: 40.2 % (ref 36.0–46.0)
Hemoglobin: 13.7 g/dL (ref 12.0–15.0)
MCH: 31.1 pg (ref 26.0–34.0)
MCHC: 34.1 g/dL (ref 30.0–36.0)
MCV: 91.2 fL (ref 78.0–100.0)
MPV: 10.1 fL (ref 8.6–12.4)
PLATELETS: 297 10*3/uL (ref 150–400)
RBC: 4.41 MIL/uL (ref 3.87–5.11)
RDW: 13.4 % (ref 11.5–15.5)
WBC: 13 10*3/uL — AB (ref 4.0–10.5)

## 2014-08-15 ENCOUNTER — Telehealth: Payer: Self-pay | Admitting: *Deleted

## 2014-08-15 ENCOUNTER — Telehealth: Payer: Self-pay

## 2014-08-15 DIAGNOSIS — L309 Dermatitis, unspecified: Secondary | ICD-10-CM

## 2014-08-15 NOTE — Telephone Encounter (Signed)
Patient's [facility] insurance requires her to have a PCP - there is one listed in response history - they will have to to referral to [facility] per her insurance and their office

## 2014-08-15 NOTE — Telephone Encounter (Signed)
Patient state she is an established patient with Dr Allyson Sabal- but needs a new referral to keep seeing him. Can we re- refer her for her eczema treatment. Ok to refer per Dr Jodi Mourning

## 2014-09-09 ENCOUNTER — Ambulatory Visit
Admission: RE | Admit: 2014-09-09 | Discharge: 2014-09-09 | Disposition: A | Discharge status: Home / Self Care | Payer: 59 | Source: Ambulatory Visit | Attending: Family Medicine | Admitting: Family Medicine

## 2014-09-09 ENCOUNTER — Other Ambulatory Visit: Payer: Self-pay | Admitting: Family Medicine

## 2014-09-09 DIAGNOSIS — M545 Low back pain: Secondary | ICD-10-CM

## 2014-10-13 HISTORY — PX: LIPOSUCTION: SHX10

## 2014-11-01 ENCOUNTER — Encounter (HOSPITAL_BASED_OUTPATIENT_CLINIC_OR_DEPARTMENT_OTHER): Payer: Self-pay | Admitting: *Deleted

## 2014-11-13 DIAGNOSIS — H5010 Unspecified exotropia: Secondary | ICD-10-CM

## 2014-11-13 HISTORY — DX: Unspecified exotropia: H50.10

## 2014-11-21 ENCOUNTER — Ambulatory Visit: Payer: Self-pay | Admitting: Ophthalmology

## 2014-11-21 NOTE — H&P (Signed)
  Date of examination:  10-30-14  Indication for surgery: to straighten the eyes and allow some binocularity  Pertinent past medical history:  Past Medical History  Diagnosis Date  . Exotropia of right eye 10/2014  . Eczema   . Irritable bowel syndrome (IBS)     no current med.    Pertinent ocular history:  RXT since cataract surgery at age 39.  Occasional diplopia  Pertinent family history:  Family History  Problem Relation Age of Onset  . Diabetes Father   . Cancer Maternal Grandmother   . Heart attack Maternal Grandfather   . Cancer Paternal Grandfather     General:  Healthy appearing patient in no distress.    Eyes:    Acuity  cc OD 20/60  OS 20/20  External: Within normal limits     Anterior segment: Within normal limits   X aphakia OD  Motility:   RXT=30, RHoT=6.  RXT'=35.  2+ SO OA OU, + "A" pattern  Fundus: deferred     Refraction:  Manifest  OD +4  OS -2.50  Heart: Regular rate and rhythm without murmur     Lungs: Clear to auscultation     Abdomen: Soft, nontender, normal bowel sounds     Impression:RXT, RHoT, likely sensory  Plan: RLR recess with upshift, RMR resect with upshift.  No SO weakening:  Pt declines surgery on OS  Abigail Clark O

## 2014-11-26 ENCOUNTER — Encounter (HOSPITAL_BASED_OUTPATIENT_CLINIC_OR_DEPARTMENT_OTHER): Payer: Self-pay | Admitting: *Deleted

## 2014-11-29 ENCOUNTER — Encounter (HOSPITAL_BASED_OUTPATIENT_CLINIC_OR_DEPARTMENT_OTHER)
Admission: RE | Disposition: A | Discharge status: Home / Self Care | Payer: Self-pay | Source: Ambulatory Visit | Attending: Ophthalmology

## 2014-11-29 ENCOUNTER — Ambulatory Visit (HOSPITAL_BASED_OUTPATIENT_CLINIC_OR_DEPARTMENT_OTHER): Payer: 59 | Admitting: Anesthesiology

## 2014-11-29 ENCOUNTER — Ambulatory Visit (HOSPITAL_BASED_OUTPATIENT_CLINIC_OR_DEPARTMENT_OTHER)
Admission: RE | Admit: 2014-11-29 | Discharge: 2014-11-29 | Disposition: A | Discharge status: Home / Self Care | Payer: 59 | Source: Ambulatory Visit | Attending: Ophthalmology | Admitting: Ophthalmology

## 2014-11-29 ENCOUNTER — Encounter (HOSPITAL_BASED_OUTPATIENT_CLINIC_OR_DEPARTMENT_OTHER): Payer: Self-pay | Admitting: *Deleted

## 2014-11-29 DIAGNOSIS — H5021 Vertical strabismus, right eye: Secondary | ICD-10-CM | POA: Insufficient documentation

## 2014-11-29 DIAGNOSIS — K589 Irritable bowel syndrome without diarrhea: Secondary | ICD-10-CM | POA: Insufficient documentation

## 2014-11-29 DIAGNOSIS — F17200 Nicotine dependence, unspecified, uncomplicated: Secondary | ICD-10-CM | POA: Insufficient documentation

## 2014-11-29 DIAGNOSIS — H5010 Unspecified exotropia: Secondary | ICD-10-CM | POA: Insufficient documentation

## 2014-11-29 HISTORY — DX: Unspecified exotropia: H50.10

## 2014-11-29 HISTORY — DX: Irritable bowel syndrome without diarrhea: K58.9

## 2014-11-29 HISTORY — PX: STRABISMUS SURGERY: SHX218

## 2014-11-29 HISTORY — DX: Dermatitis, unspecified: L30.9

## 2014-11-29 LAB — POCT HEMOGLOBIN-HEMACUE: HEMOGLOBIN: 12.5 g/dL (ref 12.0–15.0)

## 2014-11-29 SURGERY — REPAIR STRABISMUS
Anesthesia: General | Site: Eye | Laterality: Right

## 2014-11-29 MED ORDER — MEPERIDINE HCL 25 MG/ML IJ SOLN: 6.2500 mg | INTRAMUSCULAR | Status: DC | PRN

## 2014-11-29 MED ORDER — TOBRAMYCIN-DEXAMETHASONE 0.3-0.1 % OP OINT
TOPICAL_OINTMENT | OPHTHALMIC | Status: AC
  Filled 2014-11-29: qty 3.5

## 2014-11-29 MED ORDER — MIDAZOLAM HCL 5 MG/5ML IJ SOLN
INTRAMUSCULAR | Status: DC | PRN
  Administered 2014-11-29: 2 mg via INTRAVENOUS

## 2014-11-29 MED ORDER — PROMETHAZINE HCL 25 MG/ML IJ SOLN
6.2500 mg | Freq: Once | INTRAMUSCULAR | Status: AC
  Administered 2014-11-29: 6.25 mg via INTRAVENOUS

## 2014-11-29 MED ORDER — BSS IO SOLN
INTRAOCULAR | Status: DC | PRN
  Administered 2014-11-29: 15 mL via INTRAOCULAR

## 2014-11-29 MED ORDER — BSS IO SOLN
INTRAOCULAR | Status: AC
  Filled 2014-11-29: qty 15

## 2014-11-29 MED ORDER — TOBRAMYCIN-DEXAMETHASONE 0.3-0.1 % OP SUSP
OPHTHALMIC | Status: AC
  Filled 2014-11-29: qty 2.5

## 2014-11-29 MED ORDER — FENTANYL CITRATE (PF) 100 MCG/2ML IJ SOLN
INTRAMUSCULAR | Status: AC
  Filled 2014-11-29: qty 4

## 2014-11-29 MED ORDER — OXYCODONE HCL 5 MG PO TABS: 5.0000 mg | ORAL_TABLET | Freq: Once | ORAL | Status: DC | PRN

## 2014-11-29 MED ORDER — DEXAMETHASONE SODIUM PHOSPHATE 4 MG/ML IJ SOLN
INTRAMUSCULAR | Status: DC | PRN
  Administered 2014-11-29: 10 mg via INTRAVENOUS

## 2014-11-29 MED ORDER — HYDROMORPHONE HCL 1 MG/ML IJ SOLN
INTRAMUSCULAR | Status: AC
  Filled 2014-11-29: qty 1

## 2014-11-29 MED ORDER — FENTANYL CITRATE (PF) 100 MCG/2ML IJ SOLN
INTRAMUSCULAR | Status: DC | PRN
  Administered 2014-11-29: 100 ug via INTRAVENOUS
  Administered 2014-11-29: 25 ug via INTRAVENOUS

## 2014-11-29 MED ORDER — ATROPINE SULFATE 0.4 MG/ML IJ SOLN
INTRAMUSCULAR | Status: DC | PRN
  Administered 2014-11-29: .4 mg via INTRAVENOUS

## 2014-11-29 MED ORDER — LACTATED RINGERS IV SOLN
INTRAVENOUS | Status: DC
  Administered 2014-11-29: 09:00:00 via INTRAVENOUS

## 2014-11-29 MED ORDER — KETOROLAC TROMETHAMINE 30 MG/ML IJ SOLN
INTRAMUSCULAR | Status: DC | PRN
  Administered 2014-11-29: 30 mg via INTRAVENOUS

## 2014-11-29 MED ORDER — PROMETHAZINE HCL 25 MG/ML IJ SOLN
INTRAMUSCULAR | Status: AC
  Filled 2014-11-29: qty 1

## 2014-11-29 MED ORDER — ONDANSETRON HCL 4 MG/2ML IJ SOLN
INTRAMUSCULAR | Status: DC | PRN
  Administered 2014-11-29: 4 mg via INTRAVENOUS

## 2014-11-29 MED ORDER — MIDAZOLAM HCL 2 MG/2ML IJ SOLN
INTRAMUSCULAR | Status: AC
  Filled 2014-11-29: qty 2

## 2014-11-29 MED ORDER — OXYCODONE HCL 5 MG/5ML PO SOLN: 5.0000 mg | Freq: Once | ORAL | Status: DC | PRN

## 2014-11-29 MED ORDER — TOBRAMYCIN-DEXAMETHASONE 0.3-0.1 % OP OINT
TOPICAL_OINTMENT | OPHTHALMIC | Status: DC | PRN
  Administered 2014-11-29: 1 via OPHTHALMIC

## 2014-11-29 MED ORDER — HYDROMORPHONE HCL 1 MG/ML IJ SOLN
0.2500 mg | INTRAMUSCULAR | Status: DC | PRN
  Administered 2014-11-29 (×2): 0.5 mg via INTRAVENOUS

## 2014-11-29 MED ORDER — TOBRAMYCIN-DEXAMETHASONE 0.3-0.1 % OP OINT: 1.0000 "application " | TOPICAL_OINTMENT | Freq: Two times a day (BID) | OPHTHALMIC | Status: DC

## 2014-11-29 MED ORDER — LIDOCAINE HCL (CARDIAC) 20 MG/ML IV SOLN
INTRAVENOUS | Status: DC | PRN
  Administered 2014-11-29: 60 mg via INTRAVENOUS

## 2014-11-29 MED ORDER — PROPOFOL 10 MG/ML IV BOLUS
INTRAVENOUS | Status: DC | PRN
  Administered 2014-11-29: 200 mg via INTRAVENOUS

## 2014-11-29 SURGICAL SUPPLY — 30 items
APL SRG 3 HI ABS STRL LF PLS (MISCELLANEOUS) ×1
APPLICATOR COTTON TIP 6IN STRL (MISCELLANEOUS) ×8 IMPLANT
APPLICATOR DR MATTHEWS STRL (MISCELLANEOUS) ×2 IMPLANT
BANDAGE EYE OVAL (MISCELLANEOUS) IMPLANT
CAUTERY EYE LOW TEMP 1300F FIN (OPHTHALMIC RELATED) IMPLANT
COVER BACK TABLE 60X90IN (DRAPES) ×2 IMPLANT
COVER MAYO STAND STRL (DRAPES) ×2 IMPLANT
DRAPE SURG 17X23 STRL (DRAPES) ×4 IMPLANT
DRAPE U-SHAPE 76X120 STRL (DRAPES) ×2 IMPLANT
GLOVE BIO SURGEON STRL SZ 6.5 (GLOVE) ×2 IMPLANT
GLOVE BIOGEL M STRL SZ7.5 (GLOVE) ×4 IMPLANT
GOWN STRL REUS W/ TWL LRG LVL3 (GOWN DISPOSABLE) ×1 IMPLANT
GOWN STRL REUS W/TWL LRG LVL3 (GOWN DISPOSABLE) ×2
GOWN STRL REUS W/TWL XL LVL3 (GOWN DISPOSABLE) ×2 IMPLANT
NS IRRIG 1000ML POUR BTL (IV SOLUTION) ×2 IMPLANT
PACK BASIN DAY SURGERY FS (CUSTOM PROCEDURE TRAY) ×2 IMPLANT
SHEET MEDIUM DRAPE 40X70 STRL (DRAPES) IMPLANT
SLEEVE SCD COMPRESS KNEE MED (MISCELLANEOUS) ×2 IMPLANT
SPEAR EYE SURG WECK-CEL (MISCELLANEOUS) ×4 IMPLANT
STRIP CLOSURE SKIN 1/4X4 (GAUZE/BANDAGES/DRESSINGS) IMPLANT
SUT 6 0 SILK T G140 8DA (SUTURE) IMPLANT
SUT MERSILENE 6 0 S14 DA (SUTURE) IMPLANT
SUT PLAIN 6 0 TG1408 (SUTURE) ×2 IMPLANT
SUT SILK 4 0 C 3 735G (SUTURE) IMPLANT
SUT VICRYL 6 0 S 28 (SUTURE) ×2 IMPLANT
SUT VICRYL ABS 6-0 S29 18IN (SUTURE) ×2 IMPLANT
SYR TB 1ML LL NO SAFETY (SYRINGE) ×2 IMPLANT
SYRINGE 10CC LL (SYRINGE) ×2 IMPLANT
TOWEL OR 17X24 6PK STRL BLUE (TOWEL DISPOSABLE) ×2 IMPLANT
TRAY DSU PREP LF (CUSTOM PROCEDURE TRAY) ×2 IMPLANT

## 2014-11-29 NOTE — Anesthesia Preprocedure Evaluation (Signed)
Anesthesia Evaluation  Patient identified by MRN, date of birth, ID band Patient awake    Reviewed: Allergy & Precautions, NPO status , Patient's Chart, lab work & pertinent test results  Airway Mallampati: I  TM Distance: >3 FB Neck ROM: Full    Dental  (+) Teeth Intact, Dental Advisory Given   Pulmonary Current Smoker,  breath sounds clear to auscultation        Cardiovascular Rhythm:Regular Rate:Normal     Neuro/Psych    GI/Hepatic   Endo/Other    Renal/GU      Musculoskeletal   Abdominal   Peds  Hematology   Anesthesia Other Findings   Reproductive/Obstetrics                             Anesthesia Physical Anesthesia Plan  ASA: I  Anesthesia Plan: General   Post-op Pain Management:    Induction: Intravenous  Airway Management Planned: LMA  Additional Equipment:   Intra-op Plan:   Post-operative Plan: Extubation in OR  Informed Consent: I have reviewed the patients History and Physical, chart, labs and discussed the procedure including the risks, benefits and alternatives for the proposed anesthesia with the patient or authorized representative who has indicated his/her understanding and acceptance.   Dental advisory given  Plan Discussed with: CRNA, Anesthesiologist and Surgeon  Anesthesia Plan Comments:         Anesthesia Quick Evaluation

## 2014-11-29 NOTE — Transfer of Care (Signed)
Immediate Anesthesia Transfer of Care Note  Patient: Abigail Clark  Procedure(s) Performed: Procedure(s): REPAIR STRABISMUS RIGHT EYE  (Right)  Patient Location: PACU  Anesthesia Type:General  Level of Consciousness: awake and sedated  Airway & Oxygen Therapy: Patient Spontanous Breathing  Post-op Assessment: Report given to RN and Post -op Vital signs reviewed and stable  Post vital signs: Reviewed and stable  Last Vitals:  Filed Vitals:   11/29/14 0842  BP: 118/87  Pulse: 86  Temp: 37.1 C  Resp: 18    Complications: No apparent anesthesia complications

## 2014-11-29 NOTE — H&P (View-Only) (Signed)
  Date of examination:  10-30-14  Indication for surgery: to straighten the eyes and allow some binocularity  Pertinent past medical history:  Past Medical History  Diagnosis Date  . Exotropia of right eye 10/2014  . Eczema   . Irritable bowel syndrome (IBS)     no current med.    Pertinent ocular history:  RXT since cataract surgery at age 39.  Occasional diplopia  Pertinent family history:  Family History  Problem Relation Age of Onset  . Diabetes Father   . Cancer Maternal Grandmother   . Heart attack Maternal Grandfather   . Cancer Paternal Grandfather     General:  Healthy appearing patient in no distress.    Eyes:    Acuity  cc OD 20/60  OS 20/20  External: Within normal limits     Anterior segment: Within normal limits   X aphakia OD  Motility:   RXT=30, RHoT=6.  RXT'=35.  2+ SO OA OU, + "A" pattern  Fundus: deferred     Refraction:  Manifest  OD +4  OS -2.50  Heart: Regular rate and rhythm without murmur     Lungs: Clear to auscultation     Abdomen: Soft, nontender, normal bowel sounds     Impression:RXT, RHoT, likely sensory  Plan: RLR recess with upshift, RMR resect with upshift.  No SO weakening:  Pt declines surgery on OS  Rosaleah Person O

## 2014-11-29 NOTE — Discharge Instructions (Signed)
Diet: Clear liquids, advance to soft foods then regular diet as tolerated.  Pain control:   1)  Ibuprofen 600 mg by mouth every 6-8 hours as needed for pain  2)  Percocet 7.5/325 one or two by mouth as needed every 4-6 hours as needed  for pain that is not resolved by ibuprofen  Eye medications:  Tobradex eye ointment 1/2 inch in right eye twice a day for one week  Activity: No swimming for 1 week.  It is OK to let water run over the face and eyes while showering or taking a bath, even during the first week.  No other restriction on exercise or activity.  Call Dr. Janee Morn office [phone] with any problems or concerns.    Post Anesthesia Home Care Instructions  Activity: Get plenty of rest for the remainder of the day. A responsible adult should stay with you for 24 hours following the procedure.  For the next 24 hours, DO NOT: -Drive a car -Paediatric nurse -Drink alcoholic beverages -Take any medication unless instructed by your physician -Make any legal decisions or sign important papers.  Meals: Start with liquid foods such as gelatin or soup. Progress to regular foods as tolerated. Avoid greasy, spicy, heavy foods. If nausea and/or vomiting occur, drink only clear liquids until the nausea and/or vomiting subsides. Call your physician if vomiting continues.  Special Instructions/Symptoms: Your throat may feel dry or sore from the anesthesia or the breathing tube placed in your throat during surgery. If this causes discomfort, gargle with warm salt water. The discomfort should disappear within 24 hours.  If you had a scopolamine patch placed behind your ear for the management of post- operative nausea and/or vomiting:  1. The medication in the patch is effective for 72 hours, after which it should be removed.  Wrap patch in a tissue and discard in the trash. Wash hands thoroughly with soap and water. 2. You may remove the patch earlier than 72 hours if you experience  unpleasant side effects which may include dry mouth, dizziness or visual disturbances. 3. Avoid touching the patch. Wash your hands with soap and water after contact with the patch.

## 2014-11-29 NOTE — Interval H&P Note (Signed)
History and Physical Interval Note:  11/29/2014 9:26 AM  Abigail Clark  has presented today for surgery, with the diagnosis of EXOTROPIA RIGHT EYE   The various methods of treatment have been discussed with the patient and family. After consideration of risks, benefits and other options for treatment, the patient has consented to  Procedure(s): REPAIR STRABISMUS RIGHT EYE  (Right) as a surgical intervention .  The patient's history has been reviewed, patient examined, no change in status, stable for surgery.  I have reviewed the patient's chart and labs.  Questions were answered to the patient's satisfaction.     Derry Skill

## 2014-11-29 NOTE — Op Note (Signed)
11/29/2014  10:39 AM  PATIENT:  Abigail Clark  39 y.o. female  PRE-OPERATIVE DIAGNOSIS:  1. Exotropia       2.  Right hypertropia  POST-OPERATIVE DIAGNOSIS:  same  PROCEDURE:  1. Lateral rectus muscle recession 7.0 mm right eye with 1/4 tendon width upshift   2.  Medial rectus muscle resection 6.5 mm with 1/4 tendon width upshift  SURGEON:  Lorne Skeens.Annamaria Boots, M.D.   ANESTHESIA:   general  COMPLICATIONS:None  DESCRIPTION OF PROCEDURE: The patient was taken to the operating room where She was identified by me. General anesthesia was induced without difficulty after placement of appropriate monitors. The patient was prepped and draped in standard sterile fashion. A lid speculum was placed in the right eye.  Through an inferotemporal fornix incision through conjunctiva and Tenon's fascia, the right lateral rectus muscle was engaged on a series of muscle hooks and cleared of its fascial attachments. The tendon was secured with a double-armed 6-0 Vicryl suture with a double locking bite at each border of the muscle, 1 mm from the insertion. The muscle was disinserted, and was reattached to sclera at a measured distance of 7 millimeters posterior to the original insertion, using direct scleral passes in crossed swords fashion.  The suture ends were tied securely after the position of the muscle had been checked and found to be accurate. Conjunctiva was closed with a single 6-0 plain gut suture.   Through an inferonasal fornix incision through conjunctiva and Tenon's fascia, the right medial rectus muscle was engaged on a series of muscle hooks and carefully cleared of its fascial attachments. The muscle was spread between 2 self-retaining hooks. A 2 mm bite was taken of the center of the muscle belly at a measured distance of 6.5 mm posterior to the insertion, and a knot was tied securely at this location. The needle at each end of the double-armed suture was passed from the center of the muscle  belly to the periphery, parallel to and 6.5 mm posterior to the insertion. A locking bite was placed at each border of the muscle. A resection clamp was placed on the muscle just anterior to the sutures. The muscle was disinserted. The superior pole suture was passed posteriorly to anteriorly through sclera, one fourth tendon width superior to the superior end of the medial rectus muscle insertion, then anteriorly to posteriorly one half tendon width inferior to the first pass, then posteriorly to anteriorly through the center of the muscle belly, just posterior to the previously placed knot. The inferior pole suture was similarly passed in horizontal mattress fashion through the muscle stump, transposed one fourths tendon width superiorly, then through the center of the muscle belly just posterior to the previously placed knot. The muscle was drawn up to the level of the original insertion. All slack was removed. The resection clamp was removed. The suture ends were tied securely. The portion of the muscle anterior to the sutures was carefully excised. Conjunctiva was closed with a single 6-0 plain gut suture. The speculum was removed. Tobradrex ointment was placed in the right eye. The patient was awakened without difficulty and taken to the recovery room in stable condition, having suffered no intraoperative or immediate postoperative competitions.  Lorne Skeens. Sheneka Schrom M.D.    PATIENT DISPOSITION:  PACU - hemodynamically stable.

## 2014-11-29 NOTE — Anesthesia Postprocedure Evaluation (Signed)
  Anesthesia Post-op Note  Patient: Abigail Clark  Procedure(s) Performed: Procedure(s): REPAIR STRABISMUS RIGHT EYE  (Right)  Patient Location: PACU  Anesthesia Type: General   Level of Consciousness: awake, alert  and oriented  Airway and Oxygen Therapy: Patient Spontanous Breathing  Post-op Pain: mild  Post-op Assessment: Post-op Vital signs reviewed  Post-op Vital Signs: Reviewed  Last Vitals:  Filed Vitals:   11/29/14 1225  BP: 126/65  Pulse: 76  Temp: 36.9 C  Resp: 18    Complications: No apparent anesthesia complications

## 2014-11-29 NOTE — Anesthesia Procedure Notes (Signed)
Procedure Name: LMA Insertion Performed by: Ayyub Krall W Pre-anesthesia Checklist: Patient identified, Timeout performed, Emergency Drugs available, Suction available and Patient being monitored Patient Re-evaluated:Patient Re-evaluated prior to inductionOxygen Delivery Method: Circle system utilized Preoxygenation: Pre-oxygenation with 100% oxygen Intubation Type: IV induction Ventilation: Mask ventilation without difficulty LMA: LMA flexible inserted LMA Size: 4.0 Tube type: Oral Number of attempts: 1 Placement Confirmation: positive ETCO2 Tube secured with: Tape Dental Injury: Teeth and Oropharynx as per pre-operative assessment      

## 2014-12-02 ENCOUNTER — Encounter (HOSPITAL_BASED_OUTPATIENT_CLINIC_OR_DEPARTMENT_OTHER): Payer: Self-pay | Admitting: Ophthalmology

## 2014-12-11 ENCOUNTER — Encounter: Payer: Self-pay | Admitting: Gastroenterology

## 2015-01-05 ENCOUNTER — Encounter (HOSPITAL_COMMUNITY): Payer: Self-pay | Admitting: *Deleted

## 2015-01-05 ENCOUNTER — Emergency Department (HOSPITAL_COMMUNITY)
Admission: EM | Admit: 2015-01-05 | Discharge: 2015-01-05 | Disposition: A | Discharge status: Other Acute Inpatient Hospital | Payer: 59 | Source: Home / Self Care | Attending: Family Medicine | Admitting: Family Medicine

## 2015-01-05 DIAGNOSIS — L0231 Cutaneous abscess of buttock: Secondary | ICD-10-CM

## 2015-01-05 MED ORDER — SULFAMETHOXAZOLE-TRIMETHOPRIM 800-160 MG PO TABS: 1.0000 | ORAL_TABLET | Freq: Two times a day (BID) | ORAL | Status: AC

## 2015-01-05 NOTE — ED Provider Notes (Signed)
CSN: [id number]     Arrival date & time 01/05/15  1340 History   First MD Initiated Contact with Patient 01/05/15 1449     Chief Complaint  Patient presents with  . Recurrent Skin Infections   (Consider location/radiation/quality/duration/timing/severity/associated sxs/prior Treatment) Patient is a 39 y.o. female presenting with abscess. The history is provided by the patient.  Abscess Location:  Ano-genital Ano-genital abscess location:  R buttock Abscess quality: draining, painful, redness and warmth   Red streaking: no   Duration:  1 day Progression:  Worsening Chronicity:  New Relieved by:  None tried Worsened by:  Nothing tried Ineffective treatments:  None tried Associated symptoms: no fever   Risk factors: no prior abscess   Risk factors comment:  H/o liposuction   Past Medical History  Diagnosis Date  . Exotropia of right eye 11/2014  . Eczema   . Irritable bowel syndrome (IBS)     no current med.   Past Surgical History  Procedure Laterality Date  . Cataract pediatric Right 1986  . Leep  05/20/2006  . Liposuction  10/2014    abd. - local anes.  . Strabismus surgery Right 11/29/2014    Procedure: REPAIR STRABISMUS RIGHT EYE ;  Surgeon: Everitt Amber, MD;  Location: [place];  Service: Ophthalmology;  Laterality: Right;   Family History  Problem Relation Age of Onset  . Diabetes Father   . Cancer Maternal Grandmother   . Heart attack Maternal Grandfather   . Cancer Paternal Grandfather    History  Substance Use Topics  . Smoking status: Current Every Day Smoker -- 0.00 packs/day for 7 years    Types: Cigarettes  . Smokeless tobacco: Never Used     Comment: 7 cig./day  . Alcohol Use: No   OB History    Gravida Para Term Preterm AB TAB SAB Ectopic Multiple Living   '4 3 1 2 1     3     '$ Review of Systems  Constitutional: Negative.  Negative for fever.  Skin: Positive for wound.    Allergies  Penicillins  Home Medications   Prior  to Admission medications   Medication Sig Start Date End Date Taking? Authorizing Provider  Multiple Vitamin (MULTIVITAMIN) tablet Take 1 tablet by mouth daily.    Historical Provider, MD  oxyCODONE-acetaminophen (PERCOCET/ROXICET) 5-325 MG per tablet Take by mouth every 4 (four) hours as needed for severe pain.    Historical Provider, MD  sulfamethoxazole-trimethoprim (BACTRIM DS,SEPTRA DS) 800-160 MG per tablet Take 1 tablet by mouth 2 (two) times daily. 01/05/15 01/12/15  Billy Fischer, MD  tobramycin-dexamethasone [facility]) ophthalmic ointment Place 1 application into the right eye 2 (two) times daily. 11/29/14   Everitt Amber, MD   BP 90/62 mmHg  Pulse 86  Temp(Src) 98.2 F (36.8 C) (Oral)  Resp 16  SpO2 98%  LMP 12/22/2014 Physical Exam  Constitutional: She is oriented to person, place, and time. She appears well-developed and well-nourished. She appears distressed.  Neurological: She is alert and oriented to person, place, and time.  Skin: Skin is warm and dry. There is erythema.  Tender draining abscess to right mid buttock.  Nursing note and vitals reviewed.   ED Course  Procedures (including critical care time) Labs Review Labs Reviewed  CULTURE, ROUTINE-ABSCESS    Imaging Review No results found.   MDM   1. Abscess, gluteal, right        Billy Fischer, MD 01/05/15 1531

## 2015-01-05 NOTE — ED Notes (Signed)
Pt      Reports      Pain  From  A  Bursted  abcess  r  Buttock  yest   She  Reports it is   Draining  And  u-is  painfull  As   Well        Pt  Also has  Some  Nausea

## 2015-01-05 NOTE — Discharge Instructions (Signed)
Warm compress twice a day when you take the antibiotic, take all of medicine, return as needed. °

## 2015-01-09 LAB — CULTURE, ROUTINE-ABSCESS
CULTURE: NO GROWTH
Special Requests: NORMAL

## 2015-01-09 NOTE — ED Notes (Signed)
Final report of abscess culture of buttocks negative

## 2015-02-10 ENCOUNTER — Other Ambulatory Visit: Payer: Self-pay

## 2015-02-10 ENCOUNTER — Other Ambulatory Visit (HOSPITAL_COMMUNITY)
Admission: RE | Admit: 2015-02-10 | Discharge: 2015-02-10 | Disposition: A | Discharge status: Home / Self Care | Payer: 59 | Source: Ambulatory Visit | Attending: Family Medicine | Admitting: Family Medicine

## 2015-02-10 DIAGNOSIS — Z124 Encounter for screening for malignant neoplasm of cervix: Secondary | ICD-10-CM | POA: Diagnosis not present

## 2015-02-12 LAB — CYTOLOGY - PAP

## 2015-05-16 ENCOUNTER — Other Ambulatory Visit: Payer: Self-pay

## 2015-05-16 DIAGNOSIS — Z1231 Encounter for screening mammogram for malignant neoplasm of breast: Secondary | ICD-10-CM

## 2015-06-04 ENCOUNTER — Ambulatory Visit: Payer: 59

## 2015-06-20 ENCOUNTER — Ambulatory Visit
Admission: RE | Admit: 2015-06-20 | Discharge: 2015-06-20 | Disposition: A | Discharge status: Home / Self Care | Payer: BLUE CROSS/BLUE SHIELD | Source: Ambulatory Visit

## 2015-06-20 DIAGNOSIS — Z1231 Encounter for screening mammogram for malignant neoplasm of breast: Secondary | ICD-10-CM

## 2015-08-18 ENCOUNTER — Other Ambulatory Visit: Payer: Self-pay | Admitting: Family

## 2015-08-18 DIAGNOSIS — L03317 Cellulitis of buttock: Secondary | ICD-10-CM

## 2015-08-19 ENCOUNTER — Ambulatory Visit
Admission: RE | Admit: 2015-08-19 | Discharge: 2015-08-19 | Disposition: A | Discharge status: Home / Self Care | Payer: BLUE CROSS/BLUE SHIELD | Source: Ambulatory Visit | Attending: Family | Admitting: Family

## 2015-08-19 DIAGNOSIS — L03317 Cellulitis of buttock: Secondary | ICD-10-CM

## 2016-01-14 ENCOUNTER — Other Ambulatory Visit: Payer: Self-pay

## 2016-06-23 ENCOUNTER — Other Ambulatory Visit: Payer: Self-pay

## 2016-06-23 ENCOUNTER — Other Ambulatory Visit (HOSPITAL_COMMUNITY)
Admission: RE | Admit: 2016-06-23 | Discharge: 2016-06-23 | Disposition: A | Discharge status: Home / Self Care | Payer: BLUE CROSS/BLUE SHIELD | Source: Ambulatory Visit | Attending: Family | Admitting: Family

## 2016-06-23 DIAGNOSIS — Z0000 Encounter for general adult medical examination without abnormal findings: Secondary | ICD-10-CM | POA: Insufficient documentation

## 2016-06-23 DIAGNOSIS — Z113 Encounter for screening for infections with a predominantly sexual mode of transmission: Secondary | ICD-10-CM | POA: Diagnosis present

## 2016-06-25 LAB — CYTOLOGY - PAP
ADEQUACY: ABSENT
CHLAMYDIA, DNA PROBE: NEGATIVE
DIAGNOSIS: NEGATIVE
NEISSERIA GONORRHEA: NEGATIVE
Trichomonas: NEGATIVE

## 2016-11-09 ENCOUNTER — Other Ambulatory Visit: Payer: Self-pay | Admitting: Family Medicine

## 2016-11-09 DIAGNOSIS — R109 Unspecified abdominal pain: Secondary | ICD-10-CM

## 2016-11-09 DIAGNOSIS — R1012 Left upper quadrant pain: Secondary | ICD-10-CM

## 2016-11-16 ENCOUNTER — Other Ambulatory Visit: Payer: Self-pay | Admitting: Family Medicine

## 2016-11-16 DIAGNOSIS — Z1231 Encounter for screening mammogram for malignant neoplasm of breast: Secondary | ICD-10-CM

## 2016-11-22 ENCOUNTER — Ambulatory Visit
Admission: RE | Admit: 2016-11-22 | Discharge: 2016-11-22 | Disposition: A | Discharge status: Home / Self Care | Payer: 59 | Source: Ambulatory Visit | Attending: Family Medicine | Admitting: Family Medicine

## 2016-11-22 ENCOUNTER — Ambulatory Visit
Admission: RE | Admit: 2016-11-22 | Discharge: 2016-11-22 | Disposition: A | Discharge status: Home / Self Care | Payer: BLUE CROSS/BLUE SHIELD | Source: Ambulatory Visit | Attending: Family Medicine | Admitting: Family Medicine

## 2016-11-22 DIAGNOSIS — R109 Unspecified abdominal pain: Secondary | ICD-10-CM

## 2016-11-22 DIAGNOSIS — R1012 Left upper quadrant pain: Secondary | ICD-10-CM

## 2016-11-29 ENCOUNTER — Ambulatory Visit
Admission: RE | Admit: 2016-11-29 | Discharge: 2016-11-29 | Disposition: A | Discharge status: Home / Self Care | Payer: 59 | Source: Ambulatory Visit | Attending: Family Medicine | Admitting: Family Medicine

## 2016-11-29 DIAGNOSIS — Z1231 Encounter for screening mammogram for malignant neoplasm of breast: Secondary | ICD-10-CM

## 2017-09-22 ENCOUNTER — Other Ambulatory Visit: Payer: Self-pay | Admitting: Family Medicine

## 2017-09-22 ENCOUNTER — Other Ambulatory Visit (HOSPITAL_COMMUNITY)
Admission: RE | Admit: 2017-09-22 | Discharge: 2017-09-22 | Disposition: A | Discharge status: Home / Self Care | Payer: BLUE CROSS/BLUE SHIELD | Source: Ambulatory Visit | Attending: Family Medicine | Admitting: Family Medicine

## 2017-09-22 DIAGNOSIS — Z01411 Encounter for gynecological examination (general) (routine) with abnormal findings: Secondary | ICD-10-CM | POA: Diagnosis present

## 2017-09-26 LAB — CYTOLOGY - PAP
CHLAMYDIA, DNA PROBE: NEGATIVE
Diagnosis: NEGATIVE
HPV: NOT DETECTED
Neisseria Gonorrhea: NEGATIVE

## 2018-07-20 ENCOUNTER — Other Ambulatory Visit: Payer: Self-pay | Admitting: Family Medicine

## 2018-07-20 DIAGNOSIS — N644 Mastodynia: Secondary | ICD-10-CM

## 2018-07-26 ENCOUNTER — Ambulatory Visit
Admission: RE | Admit: 2018-07-26 | Discharge: 2018-07-26 | Disposition: A | Discharge status: Home / Self Care | Payer: BLUE CROSS/BLUE SHIELD | Source: Ambulatory Visit | Attending: Family Medicine | Admitting: Family Medicine

## 2018-07-26 ENCOUNTER — Ambulatory Visit: Payer: Self-pay

## 2018-07-26 DIAGNOSIS — N644 Mastodynia: Secondary | ICD-10-CM

## 2018-07-28 ENCOUNTER — Other Ambulatory Visit: Payer: BLUE CROSS/BLUE SHIELD

## 2019-01-11 ENCOUNTER — Other Ambulatory Visit: Payer: Self-pay

## 2019-01-11 DIAGNOSIS — Z20822 Contact with and (suspected) exposure to covid-19: Secondary | ICD-10-CM

## 2019-01-13 LAB — NOVEL CORONAVIRUS, NAA: SARS-CoV-2, NAA: NOT DETECTED

## 2019-02-03 ENCOUNTER — Encounter: Payer: Self-pay | Admitting: Gastroenterology

## 2019-08-14 ENCOUNTER — Other Ambulatory Visit: Payer: Self-pay | Admitting: Family Medicine

## 2019-08-14 DIAGNOSIS — Z1231 Encounter for screening mammogram for malignant neoplasm of breast: Secondary | ICD-10-CM

## 2019-08-15 ENCOUNTER — Other Ambulatory Visit: Payer: Self-pay | Admitting: Internal Medicine

## 2019-08-15 ENCOUNTER — Ambulatory Visit
Admission: RE | Admit: 2019-08-15 | Discharge: 2019-08-15 | Disposition: A | Discharge status: Home / Self Care | Payer: 59 | Source: Ambulatory Visit

## 2019-08-15 ENCOUNTER — Other Ambulatory Visit: Payer: Self-pay

## 2019-08-15 DIAGNOSIS — Z1231 Encounter for screening mammogram for malignant neoplasm of breast: Secondary | ICD-10-CM

## 2020-03-27 ENCOUNTER — Encounter: Payer: Self-pay | Admitting: Gastroenterology

## 2020-05-16 ENCOUNTER — Other Ambulatory Visit: Payer: Self-pay

## 2020-05-16 ENCOUNTER — Ambulatory Visit (AMBULATORY_SURGERY_CENTER): Payer: Self-pay

## 2020-05-16 VITALS — Ht 68.0 in | Wt 195.6 lb

## 2020-05-16 DIAGNOSIS — Z01818 Encounter for other preprocedural examination: Secondary | ICD-10-CM

## 2020-05-16 DIAGNOSIS — Z1211 Encounter for screening for malignant neoplasm of colon: Secondary | ICD-10-CM

## 2020-05-16 MED ORDER — NA SULFATE-K SULFATE-MG SULF 17.5-3.13-1.6 GM/177ML PO SOLN: 1.0000 | Freq: Once | ORAL | 0 refills | Status: AC

## 2020-05-16 NOTE — Progress Notes (Signed)
No egg or soy allergy known to patient  No issues with past sedation with any surgeries or procedures No intubation problems in the past  No FH of Malignant Hyperthermia No diet pills per patient No home 02 use per patient  No blood thinners per patient  Pt denies issues with constipation  No A fib or A flutter  EMMI video to pt or via [place] [employer] guidelines implemented in [place] today with Pt and RN  Coupon given to pt in PV today , Code to Pharmacy  COVID screening scheduled on 05/28/2020 at 10:00 am; patient is aware of appt date/time; Due to the COVID-19 pandemic we are asking patients to follow these guidelines. Please only bring one care partner. Please be aware that your care partner may wait in the car in the parking lot or if they feel like they will be too hot to wait in the car, they may wait in the lobby on the 4th floor. All care partners are required to wear a mask the entire time (we do not have any that we can provide them), they need to practice social distancing, and we will do a Covid check for all patient's and care partners when you arrive. Also we will check their temperature and your temperature. If the care partner waits in their car they need to stay in the parking lot the entire time and we will call them on their cell phone when the patient is ready for discharge so they can bring the car to the front of the building. Also all patient's will need to wear a mask into building.'

## 2020-05-28 ENCOUNTER — Other Ambulatory Visit: Payer: Self-pay | Admitting: Gastroenterology

## 2020-05-28 LAB — SARS CORONAVIRUS 2 (TAT 6-24 HRS): SARS Coronavirus 2: NEGATIVE

## 2020-05-30 ENCOUNTER — Other Ambulatory Visit: Payer: Self-pay

## 2020-05-30 ENCOUNTER — Encounter: Payer: Self-pay | Admitting: Gastroenterology

## 2020-05-30 ENCOUNTER — Ambulatory Visit (AMBULATORY_SURGERY_CENTER): Payer: 59 | Admitting: Gastroenterology

## 2020-05-30 VITALS — BP 99/64 | HR 59 | Temp 97.3°F | Resp 18 | Ht 68.0 in | Wt 195.0 lb

## 2020-05-30 DIAGNOSIS — K635 Polyp of colon: Secondary | ICD-10-CM | POA: Diagnosis not present

## 2020-05-30 DIAGNOSIS — Z1211 Encounter for screening for malignant neoplasm of colon: Secondary | ICD-10-CM | POA: Diagnosis present

## 2020-05-30 DIAGNOSIS — D125 Benign neoplasm of sigmoid colon: Secondary | ICD-10-CM

## 2020-05-30 DIAGNOSIS — D123 Benign neoplasm of transverse colon: Secondary | ICD-10-CM

## 2020-05-30 MED ORDER — SODIUM CHLORIDE 0.9 % IV SOLN: 500.0000 mL | Freq: Once | INTRAVENOUS | Status: DC

## 2020-05-30 NOTE — Progress Notes (Signed)
No problems noted in the recovery room. maw 

## 2020-05-30 NOTE — Op Note (Signed)
[facility] Patient Name: Abigail Clark Procedure Date: 05/30/2020 8:55 AM MRN: [id number] Endoscopist: Remo Lipps P. Havery Moros , MD Age: 44 Referring MD:  Date of Birth: November 17, 1975 Gender: Female Account #: [id number] Procedure:                Colonoscopy Indications:              Screening for colorectal malignant neoplasm Medicines:                Monitored Anesthesia Care Procedure:                Pre-Anesthesia Assessment:                           - Prior to the procedure, a History and Physical                            was performed, and patient medications and                            allergies were reviewed. The patient's tolerance of                            previous anesthesia was also reviewed. The risks                            and benefits of the procedure and the sedation                            options and risks were discussed with the patient.                            All questions were answered, and informed consent                            was obtained. Prior Anticoagulants: The patient has                            taken no previous anticoagulant or antiplatelet                            agents. ASA Grade Assessment: II - A patient with                            mild systemic disease. After reviewing the risks                            and benefits, the patient was deemed in                            satisfactory condition to undergo the procedure.                           After obtaining informed consent, the colonoscope  was passed under direct vision. Throughout the                            procedure, the patient's blood pressure, pulse, and                            oxygen saturations were monitored continuously. The                            Olympus CF-HQ190L [id number]) Colonoscope was                            introduced through the anus and advanced to the the                            cecum,  identified by appendiceal orifice and                            ileocecal valve. The colonoscopy was performed                            without difficulty. The patient tolerated the                            procedure well. The quality of the bowel                            preparation was good. The ileocecal valve,                            appendiceal orifice, and rectum were photographed. Scope In: 8:59:04 AM Scope Out: 9:16:57 AM Scope Withdrawal Time: 0 hours 11 minutes 51 seconds  Total Procedure Duration: 0 hours 17 minutes 53 seconds  Findings:                 The perianal and digital rectal examinations were                            normal.                           Two sessile polyps were found in the hepatic                            flexure. The polyps were 3 to 4 mm in size. These                            polyps were removed with a cold snare. Resection                            and retrieval were complete.                           A 3 mm polyp was found in the splenic flexure. The  polyp was sessile. The polyp was removed with a                            cold snare. Resection and retrieval were complete.                           A 3 mm polyp was found in the sigmoid colon. The                            polyp was sessile. The polyp was removed with a                            cold snare. Resection and retrieval were complete.                           Internal hemorrhoids were found during                            retroflexion. Retroflexed views were limited due to                            angulation.                           The exam was otherwise without abnormality. There                            was benign superficial erythema in the distal                            rectum most consistent with bowel prep artifact. Complications:            No immediate complications. Estimated blood loss:                             Minimal. Estimated Blood Loss:     Estimated blood loss was minimal. Impression:               - Two 3 to 4 mm polyps at the hepatic flexure,                            removed with a cold snare. Resected and retrieved.                           - One 3 mm polyp at the splenic flexure, removed                            with a cold snare. Resected and retrieved.                           - One 3 mm polyp in the sigmoid colon, removed with                            a cold  snare. Resected and retrieved.                           - Internal hemorrhoids.                           - The examination was otherwise normal. Recommendation:           - Patient has a contact number available for                            emergencies. The signs and symptoms of potential                            delayed complications were discussed with the                            patient. Return to normal activities tomorrow.                            Written discharge instructions were provided to the                            patient.                           - Resume previous diet.                           - Continue present medications.                           - Await pathology results. Remo Lipps P. Declyn Offield, MD 05/30/2020 9:20:27 AM This report has been signed electronically.

## 2020-05-30 NOTE — Patient Instructions (Signed)
Handouts were given to you on polyps and hemorrhoids. You may resume your current medications today. Await biopsy results.  Pathology results may take 2-3 weeks to receive. Please call if any questions or concerns.     YOU HAD AN ENDOSCOPIC PROCEDURE TODAY AT [facility]:   Refer to the procedure report that was given to you for any specific questions about what was found during the examination.  If the procedure report does not answer your questions, please call your gastroenterologist to clarify.  If you requested that your care partner not be given the details of your procedure findings, then the procedure report has been included in a sealed envelope for you to review at your convenience later.  YOU SHOULD EXPECT: Some feelings of bloating in the abdomen. Passage of more gas than usual.  Walking can help get rid of the air that was put into your GI tract during the procedure and reduce the bloating. If you had a lower endoscopy (such as a colonoscopy or flexible sigmoidoscopy) you may notice spotting of blood in your stool or on the toilet paper. If you underwent a bowel prep for your procedure, you may not have a normal bowel movement for a few days.  Please Note:  You might notice some irritation and congestion in your nose or some drainage.  This is from the oxygen used during your procedure.  There is no need for concern and it should clear up in a day or so.  SYMPTOMS TO REPORT IMMEDIATELY:   Following lower endoscopy (colonoscopy or flexible sigmoidoscopy):  Excessive amounts of blood in the stool  Significant tenderness or worsening of abdominal pains  Swelling of the abdomen that is new, acute  Fever of 100F or higher   For urgent or emergent issues, a gastroenterologist can be reached at any hour by calling [phone]. Do not use MyChart messaging for urgent concerns.    DIET:  We do recommend a small meal at first, but then you may proceed to your  regular diet.  Drink plenty of fluids but you should avoid alcoholic beverages for 24 hours.  ACTIVITY:  You should plan to take it easy for the rest of today and you should NOT DRIVE or use heavy machinery until tomorrow (because of the sedation medicines used during the test).    FOLLOW UP: Our staff will call the number listed on your records 48-72 hours following your procedure to check on you and address any questions or concerns that you may have regarding the information given to you following your procedure. If we do not reach you, we will leave a message.  We will attempt to reach you two times.  During this call, we will ask if you have developed any symptoms of COVID 19. If you develop any symptoms (ie: fever, flu-like symptoms, shortness of breath, cough etc.) before then, please call [phone].  If you test positive for Covid 19 in the 2 weeks post procedure, please call and report this information to [place].    If any biopsies were taken you will be contacted by phone or by letter within the next 1-3 weeks.  Please call us at [phone] if you have not heard about the biopsies in 3 weeks.    SIGNATURES/CONFIDENTIALITY: You and/or your care partner have signed paperwork which will be entered into your electronic medical record.  These signatures attest to the fact that that the information above on your After Visit Summary has  been reviewed and is understood.  Full responsibility of the confidentiality of this discharge information lies with you and/or your care-partner.

## 2020-05-30 NOTE — Progress Notes (Signed)
Called to room to assist during endoscopic procedure.  Patient ID and intended procedure confirmed with present staff. Received instructions for my participation in the procedure from the performing physician.  

## 2020-05-30 NOTE — Progress Notes (Signed)
Report to PACU, RN, vss, BBS= Clear.  

## 2020-05-30 NOTE — Progress Notes (Signed)
VS by SP.  Pt states no health hx changes since previsit with BG.

## 2020-06-03 ENCOUNTER — Telehealth: Payer: Self-pay

## 2020-06-03 NOTE — Telephone Encounter (Signed)
  Follow up Call-  Call back number 05/30/2020  Post procedure Call Back phone  # [phone]  Permission to leave phone message Yes  Some recent data might be hidden     Patient questions:  Do you have a fever, pain , or abdominal swelling? No. Pain Score  0 *  Have you tolerated food without any problems? Yes.    Have you been able to return to your normal activities? Yes.    Do you have any questions about your discharge instructions: Diet   No. Medications  No. Follow up visit  No.  Do you have questions or concerns about your Care? No.  Actions: * If pain score is 4 or above: No action needed, pain <4.  1. Have you developed a fever since your procedure? no  2.   Have you had an respiratory symptoms (SOB or cough) since your procedure? no  3.   Have you tested positive for COVID 19 since your procedure no  4.   Have you had any family members/close contacts diagnosed with the COVID 19 since your procedure?  no   If yes to any of these questions please route to Joylene John, RN and Joella Prince, RN

## 2020-06-03 NOTE — Telephone Encounter (Signed)
  Follow up Call-  Call back number 05/30/2020  Post procedure Call Back phone  # [phone]  Permission to leave phone message Yes  Some recent data might be hidden     1st follow up call made.  NALM

## 2020-09-01 ENCOUNTER — Other Ambulatory Visit: Payer: Self-pay | Admitting: Internal Medicine

## 2020-09-01 DIAGNOSIS — Z1231 Encounter for screening mammogram for malignant neoplasm of breast: Secondary | ICD-10-CM

## 2020-10-22 ENCOUNTER — Ambulatory Visit
Admission: RE | Admit: 2020-10-22 | Discharge: 2020-10-22 | Disposition: A | Discharge status: Home / Self Care | Payer: 59 | Source: Ambulatory Visit | Attending: Internal Medicine | Admitting: Internal Medicine

## 2020-10-22 ENCOUNTER — Other Ambulatory Visit: Payer: Self-pay

## 2020-10-22 DIAGNOSIS — Z1231 Encounter for screening mammogram for malignant neoplasm of breast: Secondary | ICD-10-CM

## 2020-10-29 ENCOUNTER — Other Ambulatory Visit: Payer: Self-pay | Admitting: Family Medicine

## 2020-10-29 DIAGNOSIS — Z1231 Encounter for screening mammogram for malignant neoplasm of breast: Secondary | ICD-10-CM

## 2020-10-31 ENCOUNTER — Ambulatory Visit
Admission: RE | Admit: 2020-10-31 | Discharge: 2020-10-31 | Disposition: A | Discharge status: Home / Self Care | Payer: 59 | Source: Ambulatory Visit | Attending: Family Medicine | Admitting: Family Medicine

## 2020-10-31 ENCOUNTER — Other Ambulatory Visit: Payer: Self-pay

## 2020-10-31 DIAGNOSIS — Z1231 Encounter for screening mammogram for malignant neoplasm of breast: Secondary | ICD-10-CM

## 2021-01-11 ENCOUNTER — Emergency Department (HOSPITAL_COMMUNITY): Payer: 59

## 2021-01-11 ENCOUNTER — Other Ambulatory Visit: Payer: Self-pay

## 2021-01-11 ENCOUNTER — Observation Stay (HOSPITAL_COMMUNITY)
Admission: EM | Admit: 2021-01-11 | Discharge: 2021-01-12 | Disposition: A | Discharge status: Home / Self Care | Payer: 59 | Attending: Emergency Medicine | Admitting: Emergency Medicine

## 2021-01-11 DIAGNOSIS — F17210 Nicotine dependence, cigarettes, uncomplicated: Secondary | ICD-10-CM | POA: Insufficient documentation

## 2021-01-11 DIAGNOSIS — K8020 Calculus of gallbladder without cholecystitis without obstruction: Secondary | ICD-10-CM | POA: Diagnosis not present

## 2021-01-11 DIAGNOSIS — Z20822 Contact with and (suspected) exposure to covid-19: Secondary | ICD-10-CM | POA: Insufficient documentation

## 2021-01-11 DIAGNOSIS — R918 Other nonspecific abnormal finding of lung field: Secondary | ICD-10-CM | POA: Diagnosis present

## 2021-01-11 DIAGNOSIS — R079 Chest pain, unspecified: Secondary | ICD-10-CM | POA: Diagnosis present

## 2021-01-11 DIAGNOSIS — R590 Localized enlarged lymph nodes: Secondary | ICD-10-CM

## 2021-01-11 DIAGNOSIS — D1431 Benign neoplasm of right bronchus and lung: Secondary | ICD-10-CM | POA: Diagnosis not present

## 2021-01-11 LAB — COMPREHENSIVE METABOLIC PANEL
ALT: 13 U/L (ref 0–44)
AST: 17 U/L (ref 15–41)
Albumin: 3.5 g/dL (ref 3.5–5.0)
Alkaline Phosphatase: 76 U/L (ref 38–126)
Anion gap: 9 (ref 5–15)
BUN: 8 mg/dL (ref 6–20)
CO2: 22 mmol/L (ref 22–32)
Calcium: 9.2 mg/dL (ref 8.9–10.3)
Chloride: 105 mmol/L (ref 98–111)
Creatinine, Ser: 0.87 mg/dL (ref 0.44–1.00)
GFR, Estimated: >60 mL/min (ref >60)
Glucose, Bld: 113 mg/dL — ABNORMAL HIGH (ref 70–99)
Potassium: 3.4 mmol/L — ABNORMAL LOW (ref 3.5–5.1)
Sodium: 136 mmol/L (ref 135–145)
Total Bilirubin: 0.5 mg/dL (ref 0.3–1.2)
Total Protein: 7.4 g/dL (ref 6.5–8.1)

## 2021-01-11 LAB — CBC WITH DIFFERENTIAL/PLATELET
Abs Immature Granulocytes: 0.08 10*3/uL — ABNORMAL HIGH (ref 0.00–0.07)
Basophils Absolute: 0.1 10*3/uL (ref 0.0–0.1)
Basophils Relative: 0 %
Eosinophils Absolute: 0.3 10*3/uL (ref 0.0–0.5)
Eosinophils Relative: 2 %
HCT: 37.8 % (ref 36.0–46.0)
Hemoglobin: 12.6 g/dL (ref 12.0–15.0)
Immature Granulocytes: 0 %
Lymphocytes Relative: 11 %
Lymphs Abs: 2.1 10*3/uL (ref 0.7–4.0)
MCH: 29 pg (ref 26.0–34.0)
MCHC: 33.3 g/dL (ref 30.0–36.0)
MCV: 86.9 fL (ref 80.0–100.0)
Monocytes Absolute: 1.1 10*3/uL — ABNORMAL HIGH (ref 0.1–1.0)
Monocytes Relative: 6 %
Neutro Abs: 14.7 10*3/uL — ABNORMAL HIGH (ref 1.7–7.7)
Neutrophils Relative %: 81 %
Platelets: 341 10*3/uL (ref 150–400)
RBC: 4.35 MIL/uL (ref 3.87–5.11)
RDW: 13.7 % (ref 11.5–15.5)
WBC: 18.2 10*3/uL — ABNORMAL HIGH (ref 4.0–10.5)
nRBC: 0 % (ref 0.0–0.2)

## 2021-01-11 LAB — CBG MONITORING, ED: Glucose-Capillary: 101 mg/dL — ABNORMAL HIGH (ref 70–99)

## 2021-01-11 LAB — TROPONIN I (HIGH SENSITIVITY): Troponin I (High Sensitivity): 3 ng/L (ref <18)

## 2021-01-11 LAB — I-STAT BETA HCG BLOOD, ED (MC, WL, AP ONLY): I-stat hCG, quantitative: <5 m[IU]/mL (ref <5)

## 2021-01-11 LAB — LIPASE, BLOOD: Lipase: 31 U/L (ref 11–51)

## 2021-01-11 NOTE — ED Triage Notes (Signed)
Pt endorses feeling extremely tired, mid sternal CP, body aches, shoulder pain, neck pain, frequent urination.  All symptoms began last night.

## 2021-01-11 NOTE — ED Provider Notes (Signed)
Emergency Medicine Provider Triage Evaluation Note  Abigail Clark , a 45 y.o. female  was evaluated in triage.  Pt complains of chest pain, shoulder pain, neck pain, urinary frequency and feeling very fatigued.  Symptoms began yesterday.  Review of Systems  Positive: Cp, ur frequency, body aches Negative: fever  Physical Exam  BP 109/77 (BP Location: Left Arm)   Pulse 95   Temp 99.1 F (37.3 C) (Oral)   Resp 20   Ht 5\' 7"  (1.702 m)   Wt 86.6 kg   SpO2 100%   BMI 29.91 kg/m  Gen:   Awake, no distress   Resp:  Normal effort  MSK:   Moves extremities without difficulty  Other:  Mild ttp of lower abd  Medical Decision Making  Medically screening exam initiated at 8:05 PM.  Appropriate orders placed.  Abigail Clark was informed that the remainder of the evaluation will be completed by another provider, this initial triage assessment does not replace that evaluation, and the importance of remaining in the ED until their evaluation is complete.  Labs, ua, cxr, ekg   Franchot Heidelberg, PA-C 01/11/21 2006    Orpah Greek, MD 01/12/21 [phone]

## 2021-01-12 ENCOUNTER — Observation Stay (HOSPITAL_COMMUNITY): Payer: 59 | Admitting: Certified Registered Nurse Anesthetist

## 2021-01-12 ENCOUNTER — Encounter: Payer: Self-pay | Admitting: Internal Medicine

## 2021-01-12 ENCOUNTER — Encounter (HOSPITAL_COMMUNITY)
Admission: EM | Disposition: A | Discharge status: Home / Self Care | Payer: Self-pay | Source: Home / Self Care | Attending: Emergency Medicine

## 2021-01-12 ENCOUNTER — Encounter (HOSPITAL_COMMUNITY): Payer: Self-pay | Admitting: Internal Medicine

## 2021-01-12 ENCOUNTER — Emergency Department (HOSPITAL_COMMUNITY): Payer: 59

## 2021-01-12 ENCOUNTER — Telehealth: Payer: Self-pay | Admitting: Internal Medicine

## 2021-01-12 DIAGNOSIS — R918 Other nonspecific abnormal finding of lung field: Secondary | ICD-10-CM | POA: Diagnosis not present

## 2021-01-12 DIAGNOSIS — F17200 Nicotine dependence, unspecified, uncomplicated: Secondary | ICD-10-CM | POA: Diagnosis not present

## 2021-01-12 DIAGNOSIS — D1431 Benign neoplasm of right bronchus and lung: Secondary | ICD-10-CM | POA: Diagnosis not present

## 2021-01-12 DIAGNOSIS — F17210 Nicotine dependence, cigarettes, uncomplicated: Secondary | ICD-10-CM | POA: Diagnosis not present

## 2021-01-12 DIAGNOSIS — J432 Centrilobular emphysema: Secondary | ICD-10-CM

## 2021-01-12 DIAGNOSIS — R590 Localized enlarged lymph nodes: Secondary | ICD-10-CM

## 2021-01-12 DIAGNOSIS — K8020 Calculus of gallbladder without cholecystitis without obstruction: Secondary | ICD-10-CM | POA: Diagnosis not present

## 2021-01-12 DIAGNOSIS — Z20822 Contact with and (suspected) exposure to covid-19: Secondary | ICD-10-CM | POA: Diagnosis not present

## 2021-01-12 HISTORY — PX: BRONCHIAL NEEDLE ASPIRATION BIOPSY: SHX5106

## 2021-01-12 HISTORY — PX: VIDEO BRONCHOSCOPY WITH ENDOBRONCHIAL ULTRASOUND: SHX6177

## 2021-01-12 HISTORY — PX: BRONCHIAL BIOPSY: SHX5109

## 2021-01-12 LAB — MAGNESIUM: Magnesium: 1.9 mg/dL (ref 1.7–2.4)

## 2021-01-12 LAB — RESP PANEL BY RT-PCR (FLU A&B, COVID) ARPGX2
Influenza A by PCR: NEGATIVE
Influenza B by PCR: NEGATIVE
SARS Coronavirus 2 by RT PCR: NEGATIVE

## 2021-01-12 LAB — TROPONIN I (HIGH SENSITIVITY): Troponin I (High Sensitivity): 3 ng/L (ref <18)

## 2021-01-12 LAB — TSH: TSH: 0.62 u[IU]/mL (ref 0.350–4.500)

## 2021-01-12 LAB — HIV ANTIBODY (ROUTINE TESTING W REFLEX): HIV Screen 4th Generation wRfx: NONREACTIVE

## 2021-01-12 SURGERY — BRONCHOSCOPY, WITH EBUS
Anesthesia: General

## 2021-01-12 MED ORDER — ONDANSETRON HCL 4 MG/2ML IJ SOLN
INTRAMUSCULAR | Status: DC | PRN
  Administered 2021-01-12: 4 mg via INTRAVENOUS

## 2021-01-12 MED ORDER — ONDANSETRON HCL 4 MG PO TABS: 4.0000 mg | ORAL_TABLET | Freq: Four times a day (QID) | ORAL | Status: DC | PRN

## 2021-01-12 MED ORDER — DEXAMETHASONE SODIUM PHOSPHATE 10 MG/ML IJ SOLN
INTRAMUSCULAR | Status: DC | PRN
  Administered 2021-01-12: 5 mg via INTRAVENOUS

## 2021-01-12 MED ORDER — ADULT MULTIVITAMIN W/MINERALS CH
1.0000 | ORAL_TABLET | Freq: Every day | ORAL | Status: DC
  Administered 2021-01-12: 1 via ORAL
  Filled 2021-01-12: qty 1

## 2021-01-12 MED ORDER — IOHEXOL 350 MG/ML SOLN
100.0000 mL | Freq: Once | INTRAVENOUS | Status: AC | PRN
  Administered 2021-01-12: 100 mL via INTRAVENOUS

## 2021-01-12 MED ORDER — SUGAMMADEX SODIUM 200 MG/2ML IV SOLN
INTRAVENOUS | Status: DC | PRN
  Administered 2021-01-12: 200 mg via INTRAVENOUS

## 2021-01-12 MED ORDER — VITAMIN D 25 MCG (1000 UNIT) PO TABS
1000.0000 [IU] | ORAL_TABLET | Freq: Every day | ORAL | Status: DC
  Administered 2021-01-12: 1000 [IU] via ORAL
  Filled 2021-01-12: qty 1

## 2021-01-12 MED ORDER — ROCURONIUM BROMIDE 10 MG/ML (PF) SYRINGE
PREFILLED_SYRINGE | INTRAVENOUS | Status: DC | PRN
  Administered 2021-01-12: 50 mg via INTRAVENOUS

## 2021-01-12 MED ORDER — POTASSIUM CHLORIDE CRYS ER 20 MEQ PO TBCR
40.0000 meq | EXTENDED_RELEASE_TABLET | Freq: Once | ORAL | Status: AC
  Administered 2021-01-12: 40 meq via ORAL
  Filled 2021-01-12: qty 2

## 2021-01-12 MED ORDER — ACETAMINOPHEN 325 MG PO TABS: 650.0000 mg | ORAL_TABLET | Freq: Four times a day (QID) | ORAL | Status: DC | PRN

## 2021-01-12 MED ORDER — MIDAZOLAM HCL 2 MG/2ML IJ SOLN
INTRAMUSCULAR | Status: AC
  Filled 2021-01-12: qty 2

## 2021-01-12 MED ORDER — PHENYLEPHRINE HCL-NACL 10-0.9 MG/250ML-% IV SOLN
INTRAVENOUS | Status: DC | PRN
  Administered 2021-01-12: 25 ug/min via INTRAVENOUS

## 2021-01-12 MED ORDER — LIDOCAINE 2% (20 MG/ML) 5 ML SYRINGE
INTRAMUSCULAR | Status: DC | PRN
  Administered 2021-01-12: 50 mg via INTRAVENOUS

## 2021-01-12 MED ORDER — PROPOFOL 10 MG/ML IV BOLUS
INTRAVENOUS | Status: DC | PRN
  Administered 2021-01-12: 180 mg via INTRAVENOUS

## 2021-01-12 MED ORDER — FENTANYL CITRATE (PF) 100 MCG/2ML IJ SOLN
INTRAMUSCULAR | Status: AC
  Filled 2021-01-12: qty 2

## 2021-01-12 MED ORDER — MIDAZOLAM HCL 2 MG/2ML IJ SOLN
INTRAMUSCULAR | Status: DC | PRN
  Administered 2021-01-12: 2 mg via INTRAVENOUS

## 2021-01-12 MED ORDER — ASCORBIC ACID 500 MG PO TABS
500.0000 mg | ORAL_TABLET | Freq: Every day | ORAL | Status: DC
  Administered 2021-01-12: 500 mg via ORAL
  Filled 2021-01-12: qty 1

## 2021-01-12 MED ORDER — ONDANSETRON HCL 4 MG/2ML IJ SOLN: 4.0000 mg | Freq: Four times a day (QID) | INTRAMUSCULAR | Status: DC | PRN

## 2021-01-12 MED ORDER — PHENYLEPHRINE 40 MCG/ML (10ML) SYRINGE FOR IV PUSH (FOR BLOOD PRESSURE SUPPORT)
PREFILLED_SYRINGE | INTRAVENOUS | Status: DC | PRN
  Administered 2021-01-12: 160 ug via INTRAVENOUS
  Administered 2021-01-12 (×2): 120 ug via INTRAVENOUS

## 2021-01-12 MED ORDER — FENTANYL CITRATE (PF) 250 MCG/5ML IJ SOLN
INTRAMUSCULAR | Status: DC | PRN
  Administered 2021-01-12 (×2): 50 ug via INTRAVENOUS

## 2021-01-12 MED ORDER — LACTATED RINGERS IV SOLN: INTRAVENOUS | Status: DC | PRN

## 2021-01-12 MED ORDER — ACETAMINOPHEN 650 MG RE SUPP: 650.0000 mg | Freq: Four times a day (QID) | RECTAL | Status: DC | PRN

## 2021-01-12 MED ORDER — SODIUM CHLORIDE 0.9 % IV SOLN: INTRAVENOUS | Status: DC

## 2021-01-12 NOTE — Interval H&P Note (Signed)
History and Physical Interval Note:  01/12/2021 9:36 AM  Abigail Clark  has presented today for surgery, with the diagnosis of lung mass.  The various methods of treatment have been discussed with the patient and family. After consideration of risks, benefits and other options for treatment, the patient has consented to  Procedure(s): [place] (N/A) as a surgical intervention.  The patient's history has been reviewed, patient examined, no change in status, stable for surgery.  I have reviewed the patient's chart and labs.  Questions were answered to the patient's satisfaction.     Spero Geralds

## 2021-01-12 NOTE — ED Notes (Signed)
Pt transported to Endo 

## 2021-01-12 NOTE — Consult Note (Signed)
NAME:  DHAMAR GREGORY, MRN:  [id number], DOB:  04/03/1976, LOS: 0 ADMISSION DATE:  01/11/2021, CONSULTATION DATE:  01/12/2021 REFERRING MD:  Darliss Cheney, MD, CHIEF COMPLAINT:  lung mass   History of Present Illness:  Abigail Clark is a 45 y.o. woman with history of tobacco use disorder who presents with about a months worth of chest pain. She has occasional bronchitis and smokes 6-7 cigarettes a day since she was 47. (Approx 12 pack year smoking history) However never had pneumonia or been hospitalized for breathing issues. She has dyspnea on exertion and has occasionally taken an albuterol inhaler for bronchitis. She has night sweats she attributes to menopause. No unintentional weight loss or fevers. She coughed up specks of blood 2 weeks ago, nothing further since. Presented to ED for chest pain and had CT study which shows large RUL lung mass with hilar involvement. PCCM consulted for management of mass.   Pertinent  Medical History  Bronchtis Tobacco use disorder  Significant Hospital Events: Including procedures, antibiotic start and stop dates in addition to other pertinent events   7/31 presented to ED for chest pain  Interim History / Subjective:  See above  Objective   Blood pressure 96/66, pulse 78, temperature 99.1 F (37.3 C), temperature source Oral, resp. rate 15, height 5\' 7"  (1.702 m), weight 86.6 kg, SpO2 99 %.       No intake or output data in the 24 hours ending 01/12/21 0902 Filed Weights   01/11/21 2002  Weight: 86.6 kg    Examination: General: awake, alert, no distress HENT: mmm Lungs: ctab no wheezes or crackles Cardiovascular: RRR no mrg Abdomen: soft, nontender, nondistended Extremities: no edema Neuro: normal speech no focal asymmetry,  GU: voids spontaneously MSK: no rashes  Personally reviewed CT Chest which shows a large 10cm mass  Unable to appreciate adnopathy due to non contrasted study The RUL subsegments terminate inside this mass  suggesting endobronchial component  Assessment & Plan:   RUL Lung mass - presumed primary lung malignancy Tobacco use disorder 12 pack years Centrilobular emphysema  Abigail Clark is a 45 y.o. woman with tobacco use disorder  here with RUL lung mass concerning for primary lung malignancy.  We discussed the risks and benefits of bronchoscopy for biopsy and tissue sampling. She has agreed to proceed with the procedure.  Keep her NPO, we will do this today. Will set her up in pulmonary clinic for follow up. In the event this is malignancy I will make outpatient referrals for oncology.   Continue prn albuterol at home. Her emphysema is disproportionate to her reported smoking history. Will need clinic follow up.   Lenice Llamas, MD Pulmonary and [place]   Labs   CBC: Recent Labs  Lab 01/11/21 2006  WBC 18.2*  NEUTROABS 14.7*  HGB 12.6  HCT 37.8  MCV 86.9  PLT 397    Basic Metabolic Panel: Recent Labs  Lab 01/11/21 2006  NA 136  K 3.4*  CL 105  CO2 22  GLUCOSE 113*  BUN 8  CREATININE 0.87  CALCIUM 9.2   GFR: Estimated Creatinine Clearance: 91.3 mL/min (by C-G formula based on SCr of 0.87 mg/dL). Recent Labs  Lab 01/11/21 2006  WBC 18.2*    Liver Function Tests: Recent Labs  Lab 01/11/21 2006  AST 17  ALT 13  ALKPHOS 76  BILITOT 0.5  PROT 7.4  ALBUMIN 3.5   Recent Labs  Lab 01/11/21 2006  LIPASE 31   No results for input(s): AMMONIA in the last 168 hours.  ABG No results found for: PHART, PCO2ART, PO2ART, HCO3, TCO2, ACIDBASEDEF, O2SAT   Coagulation Profile: No results for input(s): INR, PROTIME in the last 168 hours.  Cardiac Enzymes: No results for input(s): CKTOTAL, CKMB, CKMBINDEX, TROPONINI in the last 168 hours.  HbA1C: No results found for: HGBA1C  CBG: Recent Labs  Lab 01/11/21 2051  GLUCAP 101*    Review of Systems:   +chest pain +dyspnea on exertion +hemoptysis +night sweats Otherwise 10  point review of systems covered and unremarkable.   Past Medical History:  She,  has a past medical history of Cataract, Eczema, Exotropia of right eye (11/2014), Irritable bowel syndrome (IBS), and Lactose intolerance.   Surgical History:   Past Surgical History:  Procedure Laterality Date   CATARACT PEDIATRIC Right 1986   LEEP  05/20/2006   LIPOSUCTION  10/2014   abd. - local anes.   STRABISMUS SURGERY Right 11/29/2014   Procedure: REPAIR STRABISMUS RIGHT EYE ;  Surgeon: Everitt Amber, MD;  Location: [facility];  Service: Ophthalmology;  Laterality: Right;   WISDOM TOOTH EXTRACTION       Social History:   reports that she has been smoking cigarettes. She has a 1.75 pack-year smoking history. She has never used smokeless tobacco. She reports that she does not drink alcohol and does not use drugs.   Family History:  Her family history includes Breast cancer in her maternal grandmother; Cancer in her maternal grandmother and paternal grandfather; Colon polyps in her mother; Diabetes in her father; Heart attack in her maternal grandfather. There is no history of Colon cancer, Esophageal cancer, Stomach cancer, or Rectal cancer.   Allergies Allergies  Allergen Reactions   Penicillins Swelling     Home Medications  Prior to Admission medications   Medication Sig Start Date End Date Taking? Authorizing Provider  cholecalciferol (VITAMIN D3) 25 MCG (1000 UNIT) tablet Take 1,000 Units by mouth daily.    [provider]  Multiple Vitamin (MULTIVITAMIN) tablet Take 1 tablet by mouth daily.    [provider]  UNABLE TO FIND Sea moss, burdock root, macca,    [provider]  vitamin C (ASCORBIC ACID) 250 MG tablet Take 250 mg by mouth daily.    [provider]  vitamin E 1000 UNIT capsule Take by mouth daily.    [provider]

## 2021-01-12 NOTE — Anesthesia Preprocedure Evaluation (Signed)
Anesthesia Evaluation  Patient identified by MRN, date of birth, ID band Patient awake    Reviewed: Allergy & Precautions, H&P , NPO status , Patient's Chart, lab work & pertinent test results  Airway Mallampati: II  TM Distance: >3 FB Neck ROM: Full    Dental no notable dental hx. (+) Teeth Intact, Dental Advisory Given   Pulmonary Current Smoker and Patient abstained from smoking.,    Pulmonary exam normal breath sounds clear to auscultation       Cardiovascular negative cardio ROS   Rhythm:Regular Rate:Normal     Neuro/Psych negative neurological ROS  negative psych ROS   GI/Hepatic negative GI ROS, Neg liver ROS,   Endo/Other  negative endocrine ROS  Renal/GU negative Renal ROS  negative genitourinary   Musculoskeletal   Abdominal   Peds  Hematology negative hematology ROS (+)   Anesthesia Other Findings   Reproductive/Obstetrics negative OB ROS                             Anesthesia Physical Anesthesia Plan  ASA: 2  Anesthesia Plan: General   Post-op Pain Management:    Induction: Intravenous  PONV Risk Score and Plan: 3 and Ondansetron, Dexamethasone and Midazolam  Airway Management Planned: Oral ETT  Additional Equipment:   Intra-op Plan:   Post-operative Plan: Extubation in OR  Informed Consent: I have reviewed the patients History and Physical, chart, labs and discussed the procedure including the risks, benefits and alternatives for the proposed anesthesia with the patient or authorized representative who has indicated his/her understanding and acceptance.     Dental advisory given  Plan Discussed with: CRNA  Anesthesia Plan Comments:         Anesthesia Quick Evaluation

## 2021-01-12 NOTE — Op Note (Signed)
[facility] Cardiopulmonary Patient Name: Abigail Clark Date: 01/12/2021 MRN: [id number] Attending MD: Senaida Ores. Shearon Stalls MD, MD Date of Birth: 07-06-1975 CSN: Finalized Age: 45 Admit Type: Inpatient Gender: Female Procedure:             Bronchoscopy Indications:           Right upper lobe mass Providers:             Senaida Ores. Shearon Stalls MD, MD, Elmer Ramp. Hinson, RN, Clae                         Houle, Rejeana Brock, CRNA Referring MD:           Medicines:             General Anesthesia Complications:         No immediate complications Estimated Blood Loss:  Estimated blood loss was minimal. Procedure:             Pre-Anesthesia Assessment:                        - The risks and benefits of the procedure and the                         sedation options and risks were discussed with the                         patient. All questions were answered and informed                         consent was obtained.                        After obtaining informed consent, the bronchoscope was                         passed under direct vision. Throughout the procedure,                         the patient's blood pressure, pulse, and oxygen                         saturations were monitored continuously. the [id number]                         ([id number]) Olympus bronchoscope was introduced through                         the mouth, via the endotracheal tube (the patient was                         intubated for the procedure) and advanced to the                         tracheobronchial tree. the [id number] ([id number]) Olympus                         EBUS scope was introduced through the mouth, via the  endotracheal tube (the patient was intubated for the                         procedure) and advanced to the tracheobronchial tree                         of both lungs. The patient tolerated the procedure                         well. Scope In: Scope Out: Findings:       The endotracheal tube is in good position. The visualized portion of the       trachea is of normal caliber. The carina is sharp. The tracheobronchial       tree was examined to at least the first subsegmental level. The left       tracheobronchial tree was normal. there was a large mass invading into       the right upper lobe. Only the anterior subsegment was patent but I was       unable to intubate it. the posterior and apical subsegments were       occluded from mass invasion. The right middle lobe was trifurcated. the       rest of bronchial anatomy was normal. I performed an airway exam first.       then the EBUS scope was insert. TBNA was taken from the subcarinal lymph       node and sent for cytology and pathology. Then performed EBUS TBNA of       the RUL mass for pathology. Endobronchial biospies were taken of the RUL       lung mass. There was some mild bleeding. Impression:            - Right upper lobe mass                        - The airway examination was normal.                        - Subcarinal lymph node TBNA sent for cytopathology                        - Lung mass TBNA sent for pathology                        - Endobronchial bio Moderate Sedation:      see anesthesia report Recommendation:        - Await test results. Procedure Code(s):     --- Professional ---                        [phone], Bronchoscopy, rigid or flexible, including                         fluoroscopic guidance, when performed; diagnostic,                         with cell washing, when performed (separate procedure) Diagnosis Code(s):     --- Professional ---                        R91.8, Other nonspecific abnormal finding of  lung field CPT copyright 2019 [employer]. All rights reserved. The codes documented in this report are preliminary and upon coder review may  be revised to meet current compliance requirements. Kaelene Elliston S. Shearon Stalls MD, MD 01/12/2021 11:34:41 AM This report  has been signed electronically. Number of Addenda: 0

## 2021-01-12 NOTE — ED Notes (Signed)
Pt called nurse to bedside stating intent to leave. Pt visibly anxious, continuing to state "I have to get out of here or I'm going to scream".

## 2021-01-12 NOTE — ED Provider Notes (Signed)
[name] EMERGENCY DEPARTMENT Provider Note   CSN: [id number] Arrival date & time: 01/11/21  1934     History Chief Complaint  Patient presents with   Chest Pain    Abigail Clark is a 45 y.o. female.  Patient presents to the emergency department with complaints of chest pain as well as generalized aching pain across her shoulders, neck and back.  She has been feeling fatigued.  She does report that she has had some cough and chest congestion.  Cough is productive of sputum as well as bloody sputum at times.      Past Medical History:  Diagnosis Date   Cataract    RIGHT EYE   Eczema    Exotropia of right eye 11/2014   Irritable bowel syndrome (IBS)    no current med.   Lactose intolerance     Patient Active Problem List   Diagnosis Date Noted   Lung mass 01/12/2021   Postcoital bleeding 01/24/2014   Mastodynia 12/26/2012   BV (bacterial vaginosis) 12/26/2012   Candidiasis of vulva and vagina 12/26/2012   LACTOSE INTOLERANCE 02/11/2009   IRRITABLE BOWEL SYNDROME 02/11/2009   ABDOMINAL PAIN, GENERALIZED 02/11/2009   CONSTIPATION 02/10/2009   OVARIAN CYST, LEFT 02/10/2009    Past Surgical History:  Procedure Laterality Date   CATARACT PEDIATRIC Right 1986   LEEP  05/20/2006   LIPOSUCTION  10/2014   abd. - local anes.   STRABISMUS SURGERY Right 11/29/2014   Procedure: REPAIR STRABISMUS RIGHT EYE ;  Surgeon: Everitt Amber, MD;  Location: [place];  Service: Ophthalmology;  Laterality: Right;   WISDOM TOOTH EXTRACTION       OB History     Gravida  4   Para  3   Term  1   Preterm  2   AB  1   Living  3      SAB      IAB      Ectopic      Multiple      Live Births  3           Family History  Problem Relation Age of Onset   Colon polyps Mother    Diabetes Father    Cancer Maternal Grandmother    Breast cancer Maternal Grandmother    Heart attack Maternal Grandfather    Cancer Paternal Grandfather     Colon cancer Neg Hx    Esophageal cancer Neg Hx    Stomach cancer Neg Hx    Rectal cancer Neg Hx     Social History   Tobacco Use   Smoking status: Every Day    Packs/day: 0.25    Years: 7.00    Pack years: 1.75    Types: Cigarettes   Smokeless tobacco: Never   Tobacco comments:    7 cig./day  Vaping Use   Vaping Use: Never used  Substance Use Topics   Alcohol use: No   Drug use: No    Home Medications Prior to Admission medications   Medication Sig Start Date End Date Taking? Authorizing Provider  cholecalciferol (VITAMIN D3) 25 MCG (1000 UNIT) tablet Take 1,000 Units by mouth daily.    [provider]  Multiple Vitamin (MULTIVITAMIN) tablet Take 1 tablet by mouth daily.    [provider]  UNABLE TO FIND Sea moss, burdock root, macca,    [provider]  vitamin C (ASCORBIC ACID) 250 MG tablet Take 250 mg by mouth daily.  [provider]  vitamin E 1000 UNIT capsule Take by mouth daily.    [provider]    Allergies    Penicillins  Review of Systems   Review of Systems  Constitutional:  Positive for fatigue.  Respiratory:  Positive for cough and shortness of breath.   Cardiovascular:  Positive for chest pain.  Musculoskeletal:  Positive for back pain, myalgias and neck pain.  All other systems reviewed and are negative.  Physical Exam Updated Vital Signs BP 116/75   Pulse 80   Temp 99.1 F (37.3 C) (Oral)   Resp (!) 21   Ht 5\' 7"  (1.702 m)   Wt 86.6 kg   SpO2 95%   BMI 29.91 kg/m   Physical Exam Vitals and nursing note reviewed.  Constitutional:      General: She is not in acute distress.    Appearance: Normal appearance. She is well-developed.  HENT:     Head: Normocephalic and atraumatic.     Right Ear: Hearing normal.     Left Ear: Hearing normal.     Nose: Nose normal.  Eyes:     Conjunctiva/sclera: Conjunctivae normal.     Pupils: Pupils are equal, round, and reactive to light.   Cardiovascular:     Rate and Rhythm: Regular rhythm.     Heart sounds: S1 normal and S2 normal. No murmur heard.   No friction rub. No gallop.  Pulmonary:     Effort: Pulmonary effort is normal. No respiratory distress.     Breath sounds: Normal breath sounds.  Chest:     Chest wall: No tenderness.  Abdominal:     General: Bowel sounds are normal.     Palpations: Abdomen is soft.     Tenderness: There is no abdominal tenderness. There is no guarding or rebound. Negative signs include Murphy's sign and McBurney's sign.     Hernia: No hernia is present.  Musculoskeletal:        General: Normal range of motion.     Cervical back: Normal range of motion and neck supple.  Skin:    General: Skin is warm and dry.     Findings: No rash.  Neurological:     Mental Status: She is alert and oriented to person, place, and time.     GCS: GCS eye subscore is 4. GCS verbal subscore is 5. GCS motor subscore is 6.     Cranial Nerves: No cranial nerve deficit.     Sensory: No sensory deficit.     Coordination: Coordination normal.  Psychiatric:        Speech: Speech normal.        Behavior: Behavior normal.        Thought Content: Thought content normal.    ED Results / Procedures / Treatments   Labs (all labs ordered are listed, but only abnormal results are displayed) Labs Reviewed  CBC WITH DIFFERENTIAL/PLATELET - Abnormal; Notable for the following components:      Result Value   WBC 18.2 (*)    Neutro Abs 14.7 (*)    Monocytes Absolute 1.1 (*)    Abs Immature Granulocytes 0.08 (*)    All other components within normal limits  COMPREHENSIVE METABOLIC PANEL - Abnormal; Notable for the following components:   Potassium 3.4 (*)    Glucose, Bld 113 (*)    All other components within normal limits  CBG MONITORING, ED - Abnormal; Notable for the following components:   Glucose-Capillary 101 (*)  All other components within normal limits  RESP PANEL BY RT-PCR (FLU A&B, COVID) [id number]   LIPASE, BLOOD  URINALYSIS, ROUTINE W REFLEX MICROSCOPIC  I-STAT BETA HCG BLOOD, ED ([facility], [facility], [facility] ONLY)  TROPONIN I (HIGH SENSITIVITY)  TROPONIN I (HIGH SENSITIVITY)    EKG None  Radiology DG Chest 2 View  Result Date: 01/11/2021 CLINICAL DATA:  Chest pain. Midsternal chest pain, fatigue and body aches. EXAM: CHEST - 2 VIEW COMPARISON:  None. FINDINGS: There is a large 9.6 x 9.1 x 10.8 cm mass in the region of the perihilar right upper lobe. Mild background bronchial thickening. No other focal airspace disease. The heart is normal in size. No pneumothorax or pleural effusion. No acute osseous abnormalities are seen. IMPRESSION: Large 9.6 x 9.1 x 10.8 cm mass in the region of the perihilar right upper lobe. Recommend chest CT for characterization (with IV contrast in the absence of contraindication). Mild background bronchial thickening. Electronically Signed   By: Keith Rake M.D.   On: 01/11/2021 20:42   CT Chest Wo Contrast  Result Date: 01/11/2021 CLINICAL DATA:  Midsternal chest pain, fatigue, abnormal chest x-ray EXAM: CT CHEST WITHOUT CONTRAST TECHNIQUE: Multidetector CT imaging of the chest was performed following the standard protocol without IV contrast. COMPARISON:  None. FINDINGS: Cardiovascular: No significant coronary artery calcification. Global cardiac size within normal limits. No pericardial effusion. The central pulmonary arteries are of normal caliber. Aberrant right subclavian artery noted. The thoracic aorta is otherwise unremarkable. Mediastinum/Nodes: There is pathologic right paratracheal adenopathy and color fluid right hilar adenopathy with the central mass within the right upper lobe. Exact measurement is difficult given noncontrast technique. The esophagus is unremarkable. The thyroid is unremarkable. Lungs/Pleura: There is apically predominant cystic lung disease which likely reflects changes of moderate centrilobular emphysema. There is a right upper lobe mass which  is confluent with the right hilum and encases the right upper lobe pulmonary bronchus measuring 9.7 x 10.0 x 8.3 cm in greatest dimension on axial image # 49/4 and coronal image # 81/6 suspicious for a primary bronchogenic neoplasm. No pneumothorax or pleural effusion. The posterior segmental branch of the right upper lobe appears obliterated by the mass. The airways are otherwise widely patent. Upper Abdomen: Several scattered hypodensities are noted within the visualized right hepatic lobe including a 2.1 cm nodule within the a posterior right hepatic lobe at axial image # 140/3. This is not well characterized on this examination, however, hepatic metastasis is not excluded. No acute abnormality identified. Musculoskeletal: No acute bone abnormality. No lytic or blastic bone lesion identified. IMPRESSION: 10.0 cm right upper lobe pulmonary mass obliterating the posterior segmental bronchus of the right upper lobe and encasing the right upper lobe pulmonary bronchus centrally suspicious for a primary bronchogenic malignancy. Associated confluent right hilar and right paratracheal adenopathy. Indeterminate lesion within the right hepatic lobe noted and hepatic metastasis is not excluded. CT examination of the chest, abdomen, and pelvis with contrast would be helpful for initial staging purposes. PET CT examination may also be helpful both for staging purposes as well as determination of a tissue sampling strategy. Cystic lung disease most in keeping with moderate centrilobular emphysema. Emphysema (ICD10-J43.9). Electronically Signed   By: Fidela Salisbury MD   On: 01/11/2021 22:27   CT CHEST ABDOMEN PELVIS W CONTRAST  Result Date: 01/12/2021 CLINICAL DATA:  Mediastinal mass. EXAM: CT CHEST, ABDOMEN, AND PELVIS WITH CONTRAST TECHNIQUE: Multidetector CT imaging of the chest, abdomen and pelvis was performed following the standard  protocol during bolus administration of intravenous contrast. CONTRAST:  144mL  OMNIPAQUE IOHEXOL 350 MG/ML SOLN COMPARISON:  None. FINDINGS: CT CHEST FINDINGS Cardiovascular: Normal heart size. No significant pericardial effusion. The thoracic aorta is normal in caliber. No atherosclerotic plaque of the thoracic aorta. No coronary artery calcifications. Mediastinum/Nodes: Slightly more hypodense soft tissue density along the right hilum could represent necrotic lymph nodes (3:31). Similar subcarinal finding (3:35). No enlarged left hilar or axillary lymph nodes. There is a 1.8 cm hypodense nodule within the right thyroid gland. Subcentimeter hypodense nodules within the left thyroid gland. The trachea and esophagus demonstrate no significant findings. Lungs/Pleura: At least moderate centrilobular emphysematous changes. Redemonstration of a right upper lobe, 10 x 9 x 8.5 cm, heterogeneous vascular peribronchovascular lesion. Associated marked narrowing/almost occlusion of right pulmonary artery superior trunk and narrowing of the interlobular right pulmonary artery. Associated encasement and marked narrowing of the right upper lobe bronchus due to external mass effect. Distal to the mass there are right upper lobe centrilobular punctate pulmonary nodule. There is also a right upper lobe 1.1 cm nodule (4:46). Also noted is interlobular septal thickening at the right apex. No pleural effusion.  No pneumothorax. Musculoskeletal: No chest wall abnormality. No suspicious lytic or blastic osseous lesions. No acute displaced fracture. CT ABDOMEN PELVIS FINDINGS Hepatobiliary: Redemonstration of several subcentimeter hypodensities within the hepatic parenchyma (3: 57, 58, 64, 74, 77). There is a 1.5 cm fluid density lesion within the right hepatic lobe that may represent a simple renal cyst. Hyperdensity within the gallbladder lumen likely represents a gallstone (3:77). No gallbladder wall thickening or pericholecystic fluid. No biliary dilatation. Pancreas: No focal lesion. Normal pancreatic contour.  No surrounding inflammatory changes. No main pancreatic ductal dilatation. Spleen: Normal in size without focal abnormality. Adrenals/Urinary Tract: No adrenal nodule bilaterally. Bilateral kidneys enhance symmetrically. No hydronephrosis. No hydroureter. The urinary bladder is unremarkable. On delayed imaging, there is no urothelial wall thickening and there are no filling defects in the opacified portions of the bilateral collecting systems or ureters. Stomach/Bowel: Stomach is within normal limits. No evidence of bowel wall thickening or dilatation. Appendix appears normal. Vascular/Lymphatic: No abdominal aorta or iliac aneurysm. No abdominal, pelvic, or inguinal lymphadenopathy. Reproductive: Uterus and bilateral adnexa are unremarkable. Other: No intraperitoneal free fluid. No intraperitoneal free gas. No organized fluid collection. Musculoskeletal: No abdominal wall hernia or abnormality. Slight fatty atrophy of the left gluteal musculature compared to the right. No suspicious lytic or blastic osseous lesions. No acute displaced fracture. IMPRESSION: 1. Right upper lobe, 10 x 9 x 8.5 cm, heterogeneous vascular peribronchovascular lesion. Associated marked narrowing/almost occlusion of right pulmonary artery superior trunk and narrowing of the interlobular right pulmonary artery. Encasement and narrowing of the right upper lobe bronchus. Additional imaging evaluation or consultation with Pulmonology or Thoracic Surgery recommended. 2. Likely necrotic right hilar and possibly subcarinal lymph nodes. 3. Distal to the mass there are right upper lobe scattered pulmonary nodules with postobstructive infection/inflammation not excluded. 4. Indeterminate left upper lobe pulmonary micronodule. 5. A 1.8 cm hypodense nodule within the right thyroid gland. Subcentimeter hypodense nodules within the left thyroid gland. Recommend thyroid US (ref: J Am Coll Radiol. 2015 Feb;12(2): 143-50). 6. Emphysema (ICD10-J43.9). 7.  Multiple hypodensities within the hepatic parenchyma may represent hepatic cysts versus metastases. 8. Cholelithiasis with no CT evidence of acute cholecystitis or choledocholithiasis. Electronically Signed   By: Iven Finn M.D.   On: 01/12/2021 03:28    Procedures Procedures   Medications Ordered in ED Medications  iohexol (OMNIPAQUE) 350 MG/ML injection 100 mL (100 mLs Intravenous Contrast Given 01/12/21 0253)    ED Course  I have reviewed the triage vital signs and the nursing notes.  Pertinent labs & imaging results that were available during my care of the patient were reviewed by me and considered in my medical decision making (see chart for details).    MDM Rules/Calculators/A&P                           Presents to the emergency department for evaluation of of chest pain, generalized body aches, malaise.  Patient does endorse cough, chest congestion and hemoptysis.  Chest x-ray shows large mass in the right midlung field.  CT scan shows significant mass-effect on lung, pulmonary arteries and bronchus.  Discussed with pulmonology on-call.  Recommends medicine admission, pulmonology will consult on patient this morning for possible bronchoscopy.  Final Clinical Impression(s) / ED Diagnoses Final diagnoses:  Chest pain  Lung mass    Rx / DC Orders ED Discharge Orders     None        Orpah Greek, MD 01/12/21 [phone]

## 2021-01-12 NOTE — Anesthesia Procedure Notes (Signed)
Procedure Name: Intubation Date/Time: 01/12/2021 10:03 AM Performed by: Inda Coke, CRNA Pre-anesthesia Checklist: Patient identified, Emergency Drugs available, Suction available and Patient being monitored Patient Re-evaluated:Patient Re-evaluated prior to induction Oxygen Delivery Method: Circle System Utilized Preoxygenation: Pre-oxygenation with 100% oxygen Induction Type: IV induction Ventilation: Mask ventilation without difficulty Laryngoscope Size: Mac and 3 Grade View: Grade I Tube type: Oral Tube size: 8.5 mm Number of attempts: 1 Airway Equipment and Method: Stylet and Oral airway Placement Confirmation: ETT inserted through vocal cords under direct vision, positive ETCO2 and breath sounds checked- equal and bilateral Secured at: 21 cm Tube secured with: Tape Dental Injury: Teeth and Oropharynx as per pre-operative assessment

## 2021-01-12 NOTE — Transfer of Care (Signed)
Immediate Anesthesia Transfer of Care Note  Patient: Abigail Clark  Procedure(s) Performed: VIDEO BRONCHOSCOPY WITH ENDOBRONCHIAL ULTRASOUND  Patient Location: Endoscopy Unit  Anesthesia Type:General  Level of Consciousness: awake and alert   Airway & Oxygen Therapy: Patient Spontanous Breathing and Patient connected to face mask oxygen  Post-op Assessment: Report given to RN and Post -op Vital signs reviewed and stable  Post vital signs: Reviewed and stable  Last Vitals:  Vitals Value Taken Time  BP 143/54 01/12/21 1132  Temp    Pulse 72 01/12/21 1133  Resp 24 01/12/21 1133  SpO2 95 % 01/12/21 1133  Vitals shown include unvalidated device data.  Last Pain:  Vitals:   01/12/21 0923  TempSrc: Oral  PainSc: 0-No pain         Complications: No notable events documented.

## 2021-01-12 NOTE — Discharge Summary (Signed)
Physician Discharge Summary  Abigail Clark HUT:[id number] DOB: [birth year] DOA: 01/11/2021  PCP: Lujean Amel, MD  Admit date: 01/11/2021 Discharge date: 01/12/2021    Admitted From: Home Disposition: Left AGAINST MEDICAL ADVICE  Recommendations for Outpatient Follow-up:  Follow up with PCP in 1-2 weeks Please obtain BMP/CBC in one week Please follow up with your PCP on the following pending results: Unresulted Labs (From admission, onward)     Start     Ordered   01/13/21 0500  Comprehensive metabolic panel  Tomorrow morning,   R        01/12/21 0745   01/13/21 0500  CBC  Tomorrow morning,   R        01/12/21 0745   01/11/21 2006  Urinalysis, Routine w reflex microscopic  Once,   STAT        01/11/21 2005             HPI: Abigail Clark is a 45 y.o. female with no significant past medical history presented to ED with a complaint of right-sided chest pain.  According to patient, she initially noted right-sided chest pain which was transient back in [birth year] on her birthday and then suddenly started having the same kind of chest pain 2 days ago.  Pain has been constant, radiating to the back and the right shoulder, aggravated with upper body movement and no relieving factor.  She describes it as pressure.  Currently 3 out of 10.  She has associated shortness of breath and some dry cough but sometimes productive cough with clear sputum.  Patient has been smoking for several years and has tried to quit several times.  She tells me that she typically smokes about 7 to 8 cigarettes/day.  Has family history of lung cancer in her grandfather who was non-smoker.  No recent weight loss history.  No other sick contact or any recent travel.   ED Course: Upon arrival to ED, she was hemodynamically stable.  Mild leukocytosis.  Mild hypokalemia.  COVID-negative.  PCCM was called overnight and they requested admission under hospital service and they will see as consultant for possible bronchoscopic  biopsy.  Brief/Interim Summary: Patient admitted for lung mass.  She underwent bronchoscopic biopsy by PCCM.  Interestingly, patient left AGAINST MEDICAL ADVICE from endoscopy suite before even coming to the ED or back to the floor.  Unfortunately, I was not informed about patient's intention of signing [place] and neither I was informed of her actually signing the [place] papers, I learned when patient disappeared from the list.  Discharge Diagnoses:  Principal Problem:   Lung mass Active Problems:   Hilar adenopathy    Discharge Instructions   Allergies as of 01/12/2021       Reactions   Penicillins Swelling        Medication List     TAKE these medications    cholecalciferol 25 MCG (1000 UNIT) tablet Commonly known as: VITAMIN D3 Take 1,000 Units by mouth daily.   multivitamin tablet Take 1 tablet by mouth daily.   UNABLE TO FIND Sea moss, burdock root, macca,   vitamin C 250 MG tablet Commonly known as: ASCORBIC ACID Take 250 mg by mouth daily.   vitamin E 1000 UNIT capsule Take by mouth daily.        Allergies  Allergen Reactions   Penicillins Swelling    Consultations: PCCM   Procedures/Studies: DG Chest 2 View  Result Date: 01/11/2021 CLINICAL DATA:  Chest pain.  Midsternal chest pain, fatigue and body aches. EXAM: CHEST - 2 VIEW COMPARISON:  None. FINDINGS: There is a large 9.6 x 9.1 x 10.8 cm mass in the region of the perihilar right upper lobe. Mild background bronchial thickening. No other focal airspace disease. The heart is normal in size. No pneumothorax or pleural effusion. No acute osseous abnormalities are seen. IMPRESSION: Large 9.6 x 9.1 x 10.8 cm mass in the region of the perihilar right upper lobe. Recommend chest CT for characterization (with IV contrast in the absence of contraindication). Mild background bronchial thickening. Electronically Signed   By: Keith Rake M.D.   On: 01/11/2021 20:42   CT  Chest Wo Contrast  Result Date: 01/11/2021 CLINICAL DATA:  Midsternal chest pain, fatigue, abnormal chest x-ray EXAM: CT CHEST WITHOUT CONTRAST TECHNIQUE: Multidetector CT imaging of the chest was performed following the standard protocol without IV contrast. COMPARISON:  None. FINDINGS: Cardiovascular: No significant coronary artery calcification. Global cardiac size within normal limits. No pericardial effusion. The central pulmonary arteries are of normal caliber. Aberrant right subclavian artery noted. The thoracic aorta is otherwise unremarkable. Mediastinum/Nodes: There is pathologic right paratracheal adenopathy and color fluid right hilar adenopathy with the central mass within the right upper lobe. Exact measurement is difficult given noncontrast technique. The esophagus is unremarkable. The thyroid is unremarkable. Lungs/Pleura: There is apically predominant cystic lung disease which likely reflects changes of moderate centrilobular emphysema. There is a right upper lobe mass which is confluent with the right hilum and encases the right upper lobe pulmonary bronchus measuring 9.7 x 10.0 x 8.3 cm in greatest dimension on axial image # 49/4 and coronal image # 81/6 suspicious for a primary bronchogenic neoplasm. No pneumothorax or pleural effusion. The posterior segmental branch of the right upper lobe appears obliterated by the mass. The airways are otherwise widely patent. Upper Abdomen: Several scattered hypodensities are noted within the visualized right hepatic lobe including a 2.1 cm nodule within the a posterior right hepatic lobe at axial image # 140/3. This is not well characterized on this examination, however, hepatic metastasis is not excluded. No acute abnormality identified. Musculoskeletal: No acute bone abnormality. No lytic or blastic bone lesion identified. IMPRESSION: 10.0 cm right upper lobe pulmonary mass obliterating the posterior segmental bronchus of the right upper lobe and  encasing the right upper lobe pulmonary bronchus centrally suspicious for a primary bronchogenic malignancy. Associated confluent right hilar and right paratracheal adenopathy. Indeterminate lesion within the right hepatic lobe noted and hepatic metastasis is not excluded. CT examination of the chest, abdomen, and pelvis with contrast would be helpful for initial staging purposes. PET CT examination may also be helpful both for staging purposes as well as determination of a tissue sampling strategy. Cystic lung disease most in keeping with moderate centrilobular emphysema. Emphysema (ICD10-J43.9). Electronically Signed   By: Fidela Salisbury MD   On: 01/11/2021 22:27   CT CHEST ABDOMEN PELVIS W CONTRAST  Result Date: 01/12/2021 CLINICAL DATA:  Mediastinal mass. EXAM: CT CHEST, ABDOMEN, AND PELVIS WITH CONTRAST TECHNIQUE: Multidetector CT imaging of the chest, abdomen and pelvis was performed following the standard protocol during bolus administration of intravenous contrast. CONTRAST:  175mL OMNIPAQUE IOHEXOL 350 MG/ML SOLN COMPARISON:  None. FINDINGS: CT CHEST FINDINGS Cardiovascular: Normal heart size. No significant pericardial effusion. The thoracic aorta is normal in caliber. No atherosclerotic plaque of the thoracic aorta. No coronary artery calcifications. Mediastinum/Nodes: Slightly more hypodense soft tissue density along the right hilum could represent necrotic lymph nodes (  3:31). Similar subcarinal finding (3:35). No enlarged left hilar or axillary lymph nodes. There is a 1.8 cm hypodense nodule within the right thyroid gland. Subcentimeter hypodense nodules within the left thyroid gland. The trachea and esophagus demonstrate no significant findings. Lungs/Pleura: At least moderate centrilobular emphysematous changes. Redemonstration of a right upper lobe, 10 x 9 x 8.5 cm, heterogeneous vascular peribronchovascular lesion. Associated marked narrowing/almost occlusion of right pulmonary artery superior  trunk and narrowing of the interlobular right pulmonary artery. Associated encasement and marked narrowing of the right upper lobe bronchus due to external mass effect. Distal to the mass there are right upper lobe centrilobular punctate pulmonary nodule. There is also a right upper lobe 1.1 cm nodule (4:46). Also noted is interlobular septal thickening at the right apex. No pleural effusion.  No pneumothorax. Musculoskeletal: No chest wall abnormality. No suspicious lytic or blastic osseous lesions. No acute displaced fracture. CT ABDOMEN PELVIS FINDINGS Hepatobiliary: Redemonstration of several subcentimeter hypodensities within the hepatic parenchyma (3: 57, 58, 64, 74, 77). There is a 1.5 cm fluid density lesion within the right hepatic lobe that may represent a simple renal cyst. Hyperdensity within the gallbladder lumen likely represents a gallstone (3:77). No gallbladder wall thickening or pericholecystic fluid. No biliary dilatation. Pancreas: No focal lesion. Normal pancreatic contour. No surrounding inflammatory changes. No main pancreatic ductal dilatation. Spleen: Normal in size without focal abnormality. Adrenals/Urinary Tract: No adrenal nodule bilaterally. Bilateral kidneys enhance symmetrically. No hydronephrosis. No hydroureter. The urinary bladder is unremarkable. On delayed imaging, there is no urothelial wall thickening and there are no filling defects in the opacified portions of the bilateral collecting systems or ureters. Stomach/Bowel: Stomach is within normal limits. No evidence of bowel wall thickening or dilatation. Appendix appears normal. Vascular/Lymphatic: No abdominal aorta or iliac aneurysm. No abdominal, pelvic, or inguinal lymphadenopathy. Reproductive: Uterus and bilateral adnexa are unremarkable. Other: No intraperitoneal free fluid. No intraperitoneal free gas. No organized fluid collection. Musculoskeletal: No abdominal wall hernia or abnormality. Slight fatty atrophy of the  left gluteal musculature compared to the right. No suspicious lytic or blastic osseous lesions. No acute displaced fracture. IMPRESSION: 1. Right upper lobe, 10 x 9 x 8.5 cm, heterogeneous vascular peribronchovascular lesion. Associated marked narrowing/almost occlusion of right pulmonary artery superior trunk and narrowing of the interlobular right pulmonary artery. Encasement and narrowing of the right upper lobe bronchus. Additional imaging evaluation or consultation with Pulmonology or Thoracic Surgery recommended. 2. Likely necrotic right hilar and possibly subcarinal lymph nodes. 3. Distal to the mass there are right upper lobe scattered pulmonary nodules with postobstructive infection/inflammation not excluded. 4. Indeterminate left upper lobe pulmonary micronodule. 5. A 1.8 cm hypodense nodule within the right thyroid gland. Subcentimeter hypodense nodules within the left thyroid gland. Recommend thyroid US (ref: J Am Coll Radiol. 2015 Feb;12(2): 143-50). 6. Emphysema (ICD10-J43.9). 7. Multiple hypodensities within the hepatic parenchyma may represent hepatic cysts versus metastases. 8. Cholelithiasis with no CT evidence of acute cholecystitis or choledocholithiasis. Electronically Signed   By: Iven Finn M.D.   On: 01/12/2021 03:28     Discharge Exam: Vitals:   01/12/21 1150 01/12/21 1200  BP: (!) 108/56 (!) 117/58  Pulse: 72 71  Resp: (!) 24 19  Temp:    SpO2: 95% 93%   Vitals:   01/12/21 1130 01/12/21 1140 01/12/21 1150 01/12/21 1200  BP: (!) 143/54 (!) 142/49 (!) 108/56 (!) 117/58  Pulse: 72 72 72 71  Resp: 19 (!) 21 (!) 24 19  Temp: 98.4 F (  36.9 C)     TempSrc: Oral     SpO2: 95% 95% 95% 93%  Weight:      Height:        Physical examination: Please refer to my H&P for physical examination.  The results of significant diagnostics from this hospitalization (including imaging, microbiology, ancillary and laboratory) are listed below for reference.      Microbiology: Recent Results (from the past 240 hour(s))  Resp Panel by RT-PCR (Flu A&B, Covid) Nasopharyngeal Swab     Status: None   Collection Time: 01/12/21  5:58 AM   Specimen: Nasopharyngeal Swab; Nasopharyngeal(NP) swabs in vial transport medium  Result Value Ref Range Status   SARS Coronavirus 2 by RT PCR NEGATIVE NEGATIVE Final    Comment: (NOTE) SARS-CoV-2 target nucleic acids are NOT DETECTED.  The SARS-CoV-2 RNA is generally detectable in upper respiratory specimens during the acute phase of infection. The lowest concentration of SARS-CoV-2 viral copies this assay can detect is 138 copies/mL. A negative result does not preclude SARS-Cov-2 infection and should not be used as the sole basis for treatment or other patient management decisions. A negative result may occur with  improper specimen collection/handling, submission of specimen other than nasopharyngeal swab, presence of viral mutation(s) within the areas targeted by this assay, and inadequate number of viral copies(<138 copies/mL). A negative result must be combined with clinical observations, patient history, and epidemiological information. The expected result is Negative.  Fact Sheet for Patients:  EntrepreneurPulse.com.au  Fact Sheet for Healthcare Providers:  IncredibleEmployment.be  This test is no t yet approved or cleared by the [place] FDA and  has been authorized for detection and/or diagnosis of SARS-CoV-2 by FDA under an Emergency Use Authorization (EUA). This EUA will remain  in effect (meaning this test can be used) for the duration of the COVID-19 declaration under Section 564(b)(1) of the Act, 21 U.S.C.section 360bbb-3(b)(1), unless the authorization is terminated  or revoked sooner.       Influenza A by PCR NEGATIVE NEGATIVE Final   Influenza B by PCR NEGATIVE NEGATIVE Final    Comment: (NOTE) The Xpert Xpress SARS-CoV-2/FLU/RSV plus assay is  intended as an aid in the diagnosis of influenza from Nasopharyngeal swab specimens and should not be used as a sole basis for treatment. Nasal washings and aspirates are unacceptable for Xpert Xpress SARS-CoV-2/FLU/RSV testing.  Fact Sheet for Patients: EntrepreneurPulse.com.au  Fact Sheet for Healthcare Providers: IncredibleEmployment.be  This test is not yet approved or cleared by the [place] FDA and has been authorized for detection and/or diagnosis of SARS-CoV-2 by FDA under an Emergency Use Authorization (EUA). This EUA will remain in effect (meaning this test can be used) for the duration of the COVID-19 declaration under Section 564(b)(1) of the Act, 21 U.S.C. section 360bbb-3(b)(1), unless the authorization is terminated or revoked.  Performed at [facility], [place] [address] [place], [place] [address]      Labs: BNP (last 3 results) No results for input(s): BNP in the last 8760 hours. Basic Metabolic Panel: Recent Labs  Lab 01/11/21 2006 01/12/21 0911  NA 136  --   K 3.4*  --   CL 105  --   CO2 22  --   GLUCOSE 113*  --   BUN 8  --   CREATININE 0.87  --   CALCIUM 9.2  --   MG  --  1.9   Liver Function Tests: Recent Labs  Lab 01/11/21 2006  AST 17  ALT 13  ALKPHOS 76  BILITOT 0.5  PROT 7.4  ALBUMIN 3.5   Recent Labs  Lab 01/11/21 2006  LIPASE 31   No results for input(s): AMMONIA in the last 168 hours. CBC: Recent Labs  Lab 01/11/21 2006  WBC 18.2*  NEUTROABS 14.7*  HGB 12.6  HCT 37.8  MCV 86.9  PLT 341   Cardiac Enzymes: No results for input(s): CKTOTAL, CKMB, CKMBINDEX, TROPONINI in the last 168 hours. BNP: Invalid input(s): POCBNP CBG: Recent Labs  Lab 01/11/21 2051  GLUCAP 101*   D-Dimer No results for input(s): DDIMER in the last 72 hours. Hgb A1c No results for input(s): HGBA1C in the last 72 hours. Lipid Profile No results for input(s): CHOL, HDL, LDLCALC, TRIG,  CHOLHDL, LDLDIRECT in the last 72 hours. Thyroid function studies Recent Labs    01/12/21 0912  TSH 0.620   Anemia work up No results for input(s): VITAMINB12, FOLATE, FERRITIN, TIBC, IRON, RETICCTPCT in the last 72 hours. Urinalysis    Component Value Date/Time   COLORURINE YELLOW 07/02/2010 1008   APPEARANCEUR CLEAR 07/02/2010 1008   LABSPEC >=1.030 01/24/2011 1548   PHURINE 6.0 01/24/2011 1548   GLUCOSEU NEGATIVE 01/24/2011 1548   HGBUR LARGE (A) 01/24/2011 1548   BILIRUBINUR negative 01/28/2014 1058   KETONESUR NEGATIVE 01/24/2011 1548   PROTEINUR Negative 01/28/2014 1058   PROTEINUR NEGATIVE 01/24/2011 1548   UROBILINOGEN negative 01/28/2014 1058   UROBILINOGEN 0.2 01/24/2011 1548   NITRITE Negative 01/28/2014 1058   NITRITE POSITIVE (A) 01/24/2011 1548   LEUKOCYTESUR Negative 01/28/2014 1058   Sepsis Labs Invalid input(s): PROCALCITONIN,  WBC,  LACTICIDVEN Microbiology Recent Results (from the past 240 hour(s))  Resp Panel by RT-PCR (Flu A&B, Covid) Nasopharyngeal Swab     Status: None   Collection Time: 01/12/21  5:58 AM   Specimen: Nasopharyngeal Swab; Nasopharyngeal(NP) swabs in vial transport medium  Result Value Ref Range Status   SARS Coronavirus 2 by RT PCR NEGATIVE NEGATIVE Final    Comment: (NOTE) SARS-CoV-2 target nucleic acids are NOT DETECTED.  The SARS-CoV-2 RNA is generally detectable in upper respiratory specimens during the acute phase of infection. The lowest concentration of SARS-CoV-2 viral copies this assay can detect is 138 copies/mL. A negative result does not preclude SARS-Cov-2 infection and should not be used as the sole basis for treatment or other patient management decisions. A negative result may occur with  improper specimen collection/handling, submission of specimen other than nasopharyngeal swab, presence of viral mutation(s) within the areas targeted by this assay, and inadequate number of viral copies(<138 copies/mL). A  negative result must be combined with clinical observations, patient history, and epidemiological information. The expected result is Negative.  Fact Sheet for Patients:  EntrepreneurPulse.com.au  Fact Sheet for Healthcare Providers:  IncredibleEmployment.be  This test is no t yet approved or cleared by the [place] FDA and  has been authorized for detection and/or diagnosis of SARS-CoV-2 by FDA under an Emergency Use Authorization (EUA). This EUA will remain  in effect (meaning this test can be used) for the duration of the COVID-19 declaration under Section 564(b)(1) of the Act, 21 U.S.C.section 360bbb-3(b)(1), unless the authorization is terminated  or revoked sooner.       Influenza A by PCR NEGATIVE NEGATIVE Final   Influenza B by PCR NEGATIVE NEGATIVE Final    Comment: (NOTE) The Xpert Xpress SARS-CoV-2/FLU/RSV plus assay is intended as an aid in the diagnosis of influenza from Nasopharyngeal swab specimens and should not be used  as a sole basis for treatment. Nasal washings and aspirates are unacceptable for Xpert Xpress SARS-CoV-2/FLU/RSV testing.  Fact Sheet for Patients: EntrepreneurPulse.com.au  Fact Sheet for Healthcare Providers: IncredibleEmployment.be  This test is not yet approved or cleared by the [place] FDA and has been authorized for detection and/or diagnosis of SARS-CoV-2 by FDA under an Emergency Use Authorization (EUA). This EUA will remain in effect (meaning this test can be used) for the duration of the COVID-19 declaration under Section 564(b)(1) of the Act, 21 U.S.C. section 360bbb-3(b)(1), unless the authorization is terminated or revoked.  Performed at [facility], [place] [address] [place], [place] [address]      Time coordinating discharge: Over 30 minutes  SIGNED:   Darliss Cheney, MD  [facility] 01/12/2021, 3:58 PM  If 7PM-7AM, please  contact night-coverage [email or web address]

## 2021-01-12 NOTE — Telephone Encounter (Signed)
Needs follow post procedure for lung mass, and also needs to be set up with oncology. Hinton Dyer can you arrange for follow up with Dr. Earlie Server?

## 2021-01-12 NOTE — H&P (Signed)
History and Physical    Abigail Clark ENI:[id number] DOB: 08/18/1975 DOA: 01/11/2021  PCP: Lujean Amel, MD  Patient coming from:   I have personally briefly reviewed patient's old medical records in [place]  Chief Complaint: Chest pain  HPI: Abigail Clark is a 45 y.o. female with no significant past medical history presented to ED with a complaint of right-sided chest pain.  According to patient, she initially noted right-sided chest pain which was transient back in [birth year] on her birthday and then suddenly started having the same kind of chest pain 2 days ago.  Pain has been constant, radiating to the back and the right shoulder, aggravated with upper body movement and no relieving factor.  She describes it as pressure.  Currently 3 out of 10.  She has associated shortness of breath and some dry cough but sometimes productive cough with clear sputum.  Patient has been smoking for several years and has tried to quit several times.  She tells me that she typically smokes about 7 to 8 cigarettes/day.  Has family history of lung cancer in her grandfather who was non-smoker.  No recent weight loss history.  No other sick contact or any recent travel.  ED Course: Upon arrival to ED, she was hemodynamically stable.  Mild leukocytosis.  Mild hypokalemia.  COVID-negative.  PCCM was called overnight and they requested admission under hospital service and they will see as consultant for possible bronchoscopic biopsy.  Review of Systems: As per HPI otherwise negative.    Past Medical History:  Diagnosis Date   Cataract    RIGHT EYE   Eczema    Exotropia of right eye 11/2014   Irritable bowel syndrome (IBS)    no current med.   Lactose intolerance     Past Surgical History:  Procedure Laterality Date   CATARACT PEDIATRIC Right 1986   LEEP  05/20/2006   LIPOSUCTION  10/2014   abd. - local anes.   STRABISMUS SURGERY Right 11/29/2014   Procedure: REPAIR STRABISMUS RIGHT EYE ;  Surgeon:  Everitt Amber, MD;  Location: [facility];  Service: Ophthalmology;  Laterality: Right;   WISDOM TOOTH EXTRACTION       reports that she has been smoking cigarettes. She has a 1.75 pack-year smoking history. She has never used smokeless tobacco. She reports that she does not drink alcohol and does not use drugs.  Allergies  Allergen Reactions   Penicillins Swelling    Family History  Problem Relation Age of Onset   Colon polyps Mother    Diabetes Father    Cancer Maternal Grandmother    Breast cancer Maternal Grandmother    Heart attack Maternal Grandfather    Cancer Paternal Grandfather    Colon cancer Neg Hx    Esophageal cancer Neg Hx    Stomach cancer Neg Hx    Rectal cancer Neg Hx     Prior to Admission medications   Medication Sig Start Date End Date Taking? Authorizing Provider  cholecalciferol (VITAMIN D3) 25 MCG (1000 UNIT) tablet Take 1,000 Units by mouth daily.    [provider]  Multiple Vitamin (MULTIVITAMIN) tablet Take 1 tablet by mouth daily.    [provider]  UNABLE TO FIND Sea moss, burdock root, macca,    [provider]  vitamin C (ASCORBIC ACID) 250 MG tablet Take 250 mg by mouth daily.    [provider]  vitamin E 1000 UNIT capsule Take by mouth daily.  [provider]    Physical Exam: Vitals:   01/12/21 0450 01/12/21 0451 01/12/21 0500 01/12/21 0600  BP: 101/87  116/75 113/81  Pulse: 75 79 80 81  Resp:  16 (!) 21 20  Temp:      TempSrc:      SpO2: 96% 97% 95% 96%  Weight:      Height:        Constitutional: NAD, calm, comfortable Vitals:   01/12/21 0450 01/12/21 0451 01/12/21 0500 01/12/21 0600  BP: 101/87  116/75 113/81  Pulse: 75 79 80 81  Resp:  16 (!) 21 20  Temp:      TempSrc:      SpO2: 96% 97% 95% 96%  Weight:      Height:       Eyes: PERRL, lids and conjunctivae normal ENMT: Mucous membranes are moist. Posterior pharynx clear of any exudate or lesions.Normal  dentition.  Neck: normal, supple, no masses, no thyromegaly Respiratory: clear to auscultation bilaterally, no wheezing, no crackles. Normal respiratory effort. No accessory muscle use.  Cardiovascular: Regular rate and rhythm, no murmurs / rubs / gallops. No extremity edema. 2+ pedal pulses. No carotid bruits.  Abdomen: no tenderness, no masses palpated. No hepatosplenomegaly. Bowel sounds positive.  Musculoskeletal: no clubbing / cyanosis. No joint deformity upper and lower extremities. Good ROM, no contractures. Normal muscle tone.  Skin: no rashes, lesions, ulcers. No induration Neurologic: CN 2-12 grossly intact. Sensation intact, DTR normal. Strength 5/5 in all 4.  Psychiatric: Normal judgment and insight. Alert and oriented x 3. Normal mood.    Labs on Admission: I have personally reviewed following labs and imaging studies  CBC: Recent Labs  Lab 01/11/21 2006  WBC 18.2*  NEUTROABS 14.7*  HGB 12.6  HCT 37.8  MCV 86.9  PLT 409   Basic Metabolic Panel: Recent Labs  Lab 01/11/21 2006  NA 136  K 3.4*  CL 105  CO2 22  GLUCOSE 113*  BUN 8  CREATININE 0.87  CALCIUM 9.2   GFR: Estimated Creatinine Clearance: 91.3 mL/min (by C-G formula based on SCr of 0.87 mg/dL). Liver Function Tests: Recent Labs  Lab 01/11/21 2006  AST 17  ALT 13  ALKPHOS 76  BILITOT 0.5  PROT 7.4  ALBUMIN 3.5   Recent Labs  Lab 01/11/21 2006  LIPASE 31   No results for input(s): AMMONIA in the last 168 hours. Coagulation Profile: No results for input(s): INR, PROTIME in the last 168 hours. Cardiac Enzymes: No results for input(s): CKTOTAL, CKMB, CKMBINDEX, TROPONINI in the last 168 hours. BNP (last 3 results) No results for input(s): PROBNP in the last 8760 hours. HbA1C: No results for input(s): HGBA1C in the last 72 hours. CBG: Recent Labs  Lab 01/11/21 2051  GLUCAP 101*   Lipid Profile: No results for input(s): CHOL, HDL, LDLCALC, TRIG, CHOLHDL, LDLDIRECT in the last 72  hours. Thyroid Function Tests: No results for input(s): TSH, T4TOTAL, FREET4, T3FREE, THYROIDAB in the last 72 hours. Anemia Panel: No results for input(s): VITAMINB12, FOLATE, FERRITIN, TIBC, IRON, RETICCTPCT in the last 72 hours. Urine analysis:    Component Value Date/Time   COLORURINE YELLOW 07/02/2010 1008   APPEARANCEUR CLEAR 07/02/2010 1008   LABSPEC >=1.030 01/24/2011 1548   PHURINE 6.0 01/24/2011 1548   GLUCOSEU NEGATIVE 01/24/2011 1548   HGBUR LARGE (A) 01/24/2011 1548   BILIRUBINUR negative 01/28/2014 [place] 01/24/2011 1548   PROTEINUR Negative 01/28/2014 [place] 01/24/2011 1548  UROBILINOGEN negative 01/28/2014 1058   UROBILINOGEN 0.2 01/24/2011 1548   NITRITE Negative 01/28/2014 1058   NITRITE POSITIVE (A) 01/24/2011 1548   LEUKOCYTESUR Negative 01/28/2014 1058    Radiological Exams on Admission: DG Chest 2 View  Result Date: 01/11/2021 CLINICAL DATA:  Chest pain. Midsternal chest pain, fatigue and body aches. EXAM: CHEST - 2 VIEW COMPARISON:  None. FINDINGS: There is a large 9.6 x 9.1 x 10.8 cm mass in the region of the perihilar right upper lobe. Mild background bronchial thickening. No other focal airspace disease. The heart is normal in size. No pneumothorax or pleural effusion. No acute osseous abnormalities are seen. IMPRESSION: Large 9.6 x 9.1 x 10.8 cm mass in the region of the perihilar right upper lobe. Recommend chest CT for characterization (with IV contrast in the absence of contraindication). Mild background bronchial thickening. Electronically Signed   By: Keith Rake M.D.   On: 01/11/2021 20:42   CT Chest Wo Contrast  Result Date: 01/11/2021 CLINICAL DATA:  Midsternal chest pain, fatigue, abnormal chest x-ray EXAM: CT CHEST WITHOUT CONTRAST TECHNIQUE: Multidetector CT imaging of the chest was performed following the standard protocol without IV contrast. COMPARISON:  None. FINDINGS: Cardiovascular: No significant  coronary artery calcification. Global cardiac size within normal limits. No pericardial effusion. The central pulmonary arteries are of normal caliber. Aberrant right subclavian artery noted. The thoracic aorta is otherwise unremarkable. Mediastinum/Nodes: There is pathologic right paratracheal adenopathy and color fluid right hilar adenopathy with the central mass within the right upper lobe. Exact measurement is difficult given noncontrast technique. The esophagus is unremarkable. The thyroid is unremarkable. Lungs/Pleura: There is apically predominant cystic lung disease which likely reflects changes of moderate centrilobular emphysema. There is a right upper lobe mass which is confluent with the right hilum and encases the right upper lobe pulmonary bronchus measuring 9.7 x 10.0 x 8.3 cm in greatest dimension on axial image # 49/4 and coronal image # 81/6 suspicious for a primary bronchogenic neoplasm. No pneumothorax or pleural effusion. The posterior segmental branch of the right upper lobe appears obliterated by the mass. The airways are otherwise widely patent. Upper Abdomen: Several scattered hypodensities are noted within the visualized right hepatic lobe including a 2.1 cm nodule within the a posterior right hepatic lobe at axial image # 140/3. This is not well characterized on this examination, however, hepatic metastasis is not excluded. No acute abnormality identified. Musculoskeletal: No acute bone abnormality. No lytic or blastic bone lesion identified. IMPRESSION: 10.0 cm right upper lobe pulmonary mass obliterating the posterior segmental bronchus of the right upper lobe and encasing the right upper lobe pulmonary bronchus centrally suspicious for a primary bronchogenic malignancy. Associated confluent right hilar and right paratracheal adenopathy. Indeterminate lesion within the right hepatic lobe noted and hepatic metastasis is not excluded. CT examination of the chest, abdomen, and pelvis with  contrast would be helpful for initial staging purposes. PET CT examination may also be helpful both for staging purposes as well as determination of a tissue sampling strategy. Cystic lung disease most in keeping with moderate centrilobular emphysema. Emphysema (ICD10-J43.9). Electronically Signed   By: Fidela Salisbury MD   On: 01/11/2021 22:27   CT CHEST ABDOMEN PELVIS W CONTRAST  Result Date: 01/12/2021 CLINICAL DATA:  Mediastinal mass. EXAM: CT CHEST, ABDOMEN, AND PELVIS WITH CONTRAST TECHNIQUE: Multidetector CT imaging of the chest, abdomen and pelvis was performed following the standard protocol during bolus administration of intravenous contrast. CONTRAST:  153mL OMNIPAQUE IOHEXOL 350 MG/ML SOLN  COMPARISON:  None. FINDINGS: CT CHEST FINDINGS Cardiovascular: Normal heart size. No significant pericardial effusion. The thoracic aorta is normal in caliber. No atherosclerotic plaque of the thoracic aorta. No coronary artery calcifications. Mediastinum/Nodes: Slightly more hypodense soft tissue density along the right hilum could represent necrotic lymph nodes (3:31). Similar subcarinal finding (3:35). No enlarged left hilar or axillary lymph nodes. There is a 1.8 cm hypodense nodule within the right thyroid gland. Subcentimeter hypodense nodules within the left thyroid gland. The trachea and esophagus demonstrate no significant findings. Lungs/Pleura: At least moderate centrilobular emphysematous changes. Redemonstration of a right upper lobe, 10 x 9 x 8.5 cm, heterogeneous vascular peribronchovascular lesion. Associated marked narrowing/almost occlusion of right pulmonary artery superior trunk and narrowing of the interlobular right pulmonary artery. Associated encasement and marked narrowing of the right upper lobe bronchus due to external mass effect. Distal to the mass there are right upper lobe centrilobular punctate pulmonary nodule. There is also a right upper lobe 1.1 cm nodule (4:46). Also noted is  interlobular septal thickening at the right apex. No pleural effusion.  No pneumothorax. Musculoskeletal: No chest wall abnormality. No suspicious lytic or blastic osseous lesions. No acute displaced fracture. CT ABDOMEN PELVIS FINDINGS Hepatobiliary: Redemonstration of several subcentimeter hypodensities within the hepatic parenchyma (3: 57, 58, 64, 74, 77). There is a 1.5 cm fluid density lesion within the right hepatic lobe that may represent a simple renal cyst. Hyperdensity within the gallbladder lumen likely represents a gallstone (3:77). No gallbladder wall thickening or pericholecystic fluid. No biliary dilatation. Pancreas: No focal lesion. Normal pancreatic contour. No surrounding inflammatory changes. No main pancreatic ductal dilatation. Spleen: Normal in size without focal abnormality. Adrenals/Urinary Tract: No adrenal nodule bilaterally. Bilateral kidneys enhance symmetrically. No hydronephrosis. No hydroureter. The urinary bladder is unremarkable. On delayed imaging, there is no urothelial wall thickening and there are no filling defects in the opacified portions of the bilateral collecting systems or ureters. Stomach/Bowel: Stomach is within normal limits. No evidence of bowel wall thickening or dilatation. Appendix appears normal. Vascular/Lymphatic: No abdominal aorta or iliac aneurysm. No abdominal, pelvic, or inguinal lymphadenopathy. Reproductive: Uterus and bilateral adnexa are unremarkable. Other: No intraperitoneal free fluid. No intraperitoneal free gas. No organized fluid collection. Musculoskeletal: No abdominal wall hernia or abnormality. Slight fatty atrophy of the left gluteal musculature compared to the right. No suspicious lytic or blastic osseous lesions. No acute displaced fracture. IMPRESSION: 1. Right upper lobe, 10 x 9 x 8.5 cm, heterogeneous vascular peribronchovascular lesion. Associated marked narrowing/almost occlusion of right pulmonary artery superior trunk and narrowing  of the interlobular right pulmonary artery. Encasement and narrowing of the right upper lobe bronchus. Additional imaging evaluation or consultation with Pulmonology or Thoracic Surgery recommended. 2. Likely necrotic right hilar and possibly subcarinal lymph nodes. 3. Distal to the mass there are right upper lobe scattered pulmonary nodules with postobstructive infection/inflammation not excluded. 4. Indeterminate left upper lobe pulmonary micronodule. 5. A 1.8 cm hypodense nodule within the right thyroid gland. Subcentimeter hypodense nodules within the left thyroid gland. Recommend thyroid US (ref: J Am Coll Radiol. 2015 Feb;12(2): 143-50). 6. Emphysema (ICD10-J43.9). 7. Multiple hypodensities within the hepatic parenchyma may represent hepatic cysts versus metastases. 8. Cholelithiasis with no CT evidence of acute cholecystitis or choledocholithiasis. Electronically Signed   By: Iven Finn M.D.   On: 01/12/2021 03:28    EKG: Independently reviewed.  Normal sinus rhythm with no acute ST-T wave changes  Assessment/Plan Active Problems:   Lung mass   Right-sided lung  mass: Likely cancer.  Patient with minimal symptoms.  Not hypoxic.  Pulmonary consulted who will perform bronchoscopy and biopsy.  Leukocytosis: Likely secondary to cancer/reactive.  Monitor.  No signs of infection.  Hypokalemia: We will replace.  DVT prophylaxis: SCDs Start: 01/12/21 0745 Code Status: Full code Family Communication: None present at bedside.  Plan of care discussed with patient in length and he verbalized understanding and agreed with it. Disposition Plan: Likely home in next 1 to 2 days after biopsy Consults called: PCCM called by ED Admission status: Observation   Status is: Observation    Dispo: The patient is from: Home              Anticipated d/c is to: Home              Patient currently is not medically stable to d/c.   Difficult to place patient No       Darliss Cheney MD [facility]  01/12/2021, 7:45 AM  To contact the attending provider between 7A-7P or the covering provider during after hours 7P-7A, please log into the web site [email or web address]

## 2021-01-12 NOTE — H&P (View-Only) (Signed)
NAME:  Abigail Clark, MRN:  [id number], DOB:  Feb 27, 1976, LOS: 0 ADMISSION DATE:  01/11/2021, CONSULTATION DATE:  01/12/2021 REFERRING MD:  Darliss Cheney, MD, CHIEF COMPLAINT:  lung mass   History of Present Illness:  Abigail Clark is a 45 y.o. woman with history of tobacco use disorder who presents with about a months worth of chest pain. She has occasional bronchitis and smokes 6-7 cigarettes a day since she was 2. (Approx 12 pack year smoking history) However never had pneumonia or been hospitalized for breathing issues. She has dyspnea on exertion and has occasionally taken an albuterol inhaler for bronchitis. She has night sweats she attributes to menopause. No unintentional weight loss or fevers. She coughed up specks of blood 2 weeks ago, nothing further since. Presented to ED for chest pain and had CT study which shows large RUL lung mass with hilar involvement. PCCM consulted for management of mass.   Pertinent  Medical History  Bronchtis Tobacco use disorder  Significant Hospital Events: Including procedures, antibiotic start and stop dates in addition to other pertinent events   7/31 presented to ED for chest pain  Interim History / Subjective:  See above  Objective   Blood pressure 96/66, pulse 78, temperature 99.1 F (37.3 C), temperature source Oral, resp. rate 15, height 5\' 7"  (1.702 m), weight 86.6 kg, SpO2 99 %.       No intake or output data in the 24 hours ending 01/12/21 0902 Filed Weights   01/11/21 2002  Weight: 86.6 kg    Examination: General: awake, alert, no distress HENT: mmm Lungs: ctab no wheezes or crackles Cardiovascular: RRR no mrg Abdomen: soft, nontender, nondistended Extremities: no edema Neuro: normal speech no focal asymmetry,  GU: voids spontaneously MSK: no rashes  Personally reviewed CT Chest which shows a large 10cm mass  Unable to appreciate adnopathy due to non contrasted study The RUL subsegments terminate inside this mass  suggesting endobronchial component  Assessment & Plan:   RUL Lung mass - presumed primary lung malignancy Tobacco use disorder 12 pack years Centrilobular emphysema  Abigail Clark is a 45 y.o. woman with tobacco use disorder  here with RUL lung mass concerning for primary lung malignancy.  We discussed the risks and benefits of bronchoscopy for biopsy and tissue sampling. She has agreed to proceed with the procedure.  Keep her NPO, we will do this today. Will set her up in pulmonary clinic for follow up. In the event this is malignancy I will make outpatient referrals for oncology.   Continue prn albuterol at home. Her emphysema is disproportionate to her reported smoking history. Will need clinic follow up.   Lenice Llamas, MD Pulmonary and [place]   Labs   CBC: Recent Labs  Lab 01/11/21 2006  WBC 18.2*  NEUTROABS 14.7*  HGB 12.6  HCT 37.8  MCV 86.9  PLT 607    Basic Metabolic Panel: Recent Labs  Lab 01/11/21 2006  NA 136  K 3.4*  CL 105  CO2 22  GLUCOSE 113*  BUN 8  CREATININE 0.87  CALCIUM 9.2   GFR: Estimated Creatinine Clearance: 91.3 mL/min (by C-G formula based on SCr of 0.87 mg/dL). Recent Labs  Lab 01/11/21 2006  WBC 18.2*    Liver Function Tests: Recent Labs  Lab 01/11/21 2006  AST 17  ALT 13  ALKPHOS 76  BILITOT 0.5  PROT 7.4  ALBUMIN 3.5   Recent Labs  Lab 01/11/21 2006  LIPASE 31   No results for input(s): AMMONIA in the last 168 hours.  ABG No results found for: PHART, PCO2ART, PO2ART, HCO3, TCO2, ACIDBASEDEF, O2SAT   Coagulation Profile: No results for input(s): INR, PROTIME in the last 168 hours.  Cardiac Enzymes: No results for input(s): CKTOTAL, CKMB, CKMBINDEX, TROPONINI in the last 168 hours.  HbA1C: No results found for: HGBA1C  CBG: Recent Labs  Lab 01/11/21 2051  GLUCAP 101*    Review of Systems:   +chest pain +dyspnea on exertion +hemoptysis +night sweats Otherwise 10  point review of systems covered and unremarkable.   Past Medical History:  She,  has a past medical history of Cataract, Eczema, Exotropia of right eye (11/2014), Irritable bowel syndrome (IBS), and Lactose intolerance.   Surgical History:   Past Surgical History:  Procedure Laterality Date   CATARACT PEDIATRIC Right 1986   LEEP  05/20/2006   LIPOSUCTION  10/2014   abd. - local anes.   STRABISMUS SURGERY Right 11/29/2014   Procedure: REPAIR STRABISMUS RIGHT EYE ;  Surgeon: Everitt Amber, MD;  Location: [facility];  Service: Ophthalmology;  Laterality: Right;   WISDOM TOOTH EXTRACTION       Social History:   reports that she has been smoking cigarettes. She has a 1.75 pack-year smoking history. She has never used smokeless tobacco. She reports that she does not drink alcohol and does not use drugs.   Family History:  Her family history includes Breast cancer in her maternal grandmother; Cancer in her maternal grandmother and paternal grandfather; Colon polyps in her mother; Diabetes in her father; Heart attack in her maternal grandfather. There is no history of Colon cancer, Esophageal cancer, Stomach cancer, or Rectal cancer.   Allergies Allergies  Allergen Reactions   Penicillins Swelling     Home Medications  Prior to Admission medications   Medication Sig Start Date End Date Taking? Authorizing Provider  cholecalciferol (VITAMIN D3) 25 MCG (1000 UNIT) tablet Take 1,000 Units by mouth daily.    [provider]  Multiple Vitamin (MULTIVITAMIN) tablet Take 1 tablet by mouth daily.    [provider]  UNABLE TO FIND Sea moss, burdock root, macca,    [provider]  vitamin C (ASCORBIC ACID) 250 MG tablet Take 250 mg by mouth daily.    [provider]  vitamin E 1000 UNIT capsule Take by mouth daily.    [provider]

## 2021-01-12 NOTE — ED Notes (Signed)
Pt anxiety continued. Called significant other to pick her up. All lines removed from patient. AMA form signed. Discharge vitals refused by patient.

## 2021-01-12 NOTE — Telephone Encounter (Signed)
Called and spoke with patient. The next 30 min appt is not until 02/18/21. She does not feel comfortable waiting that long.   Dr. Shearon Stalls, you do have an 15 min opening on 8/17. Would you be ok with her being scheduled in a 15 min slot?

## 2021-01-13 ENCOUNTER — Encounter (HOSPITAL_COMMUNITY): Payer: Self-pay | Admitting: Internal Medicine

## 2021-01-13 NOTE — Telephone Encounter (Signed)
Yes you can use 15 minute slot

## 2021-01-13 NOTE — Telephone Encounter (Signed)
Appt scheduled for 01/28/2021 at 3;15. Patient is aware and voiced her understanding. Address provided.  Nothing further needed at this time.

## 2021-01-13 NOTE — Anesthesia Postprocedure Evaluation (Signed)
Anesthesia Post Note  Patient: Abigail Clark  Procedure(s) Performed: VIDEO BRONCHOSCOPY WITH ENDOBRONCHIAL ULTRASOUND BRONCHIAL BIOPSIES BRONCHIAL NEEDLE ASPIRATION BIOPSIES     Patient location during evaluation: PACU Anesthesia Type: General Level of consciousness: awake and alert Pain management: pain level controlled Vital Signs Assessment: post-procedure vital signs reviewed and stable Respiratory status: spontaneous breathing, nonlabored ventilation, respiratory function stable and patient connected to nasal cannula oxygen Cardiovascular status: blood pressure returned to baseline and stable Postop Assessment: no apparent nausea or vomiting Anesthetic complications: no   No notable events documented.  Last Vitals:  Vitals:   01/12/21 1150 01/12/21 1200  BP: (!) 108/56 (!) 117/58  Pulse: 72 71  Resp: (!) 24 19  Temp:    SpO2: 95% 93%    Last Pain:  Vitals:   01/12/21 1150  TempSrc:   PainSc: 0-No pain                 Daris Aristizabal S

## 2021-01-14 ENCOUNTER — Other Ambulatory Visit: Payer: Self-pay | Admitting: Internal Medicine

## 2021-01-14 ENCOUNTER — Other Ambulatory Visit: Payer: Self-pay | Admitting: *Deleted

## 2021-01-14 ENCOUNTER — Telehealth: Payer: Self-pay | Admitting: Internal Medicine

## 2021-01-14 DIAGNOSIS — R918 Other nonspecific abnormal finding of lung field: Secondary | ICD-10-CM

## 2021-01-14 LAB — CYTOLOGY - NON PAP

## 2021-01-14 LAB — SURGICAL PATHOLOGY

## 2021-01-14 NOTE — Progress Notes (Signed)
Radiation Oncology         [phone] ________________________________  Multidisciplinary Thoracic Oncology Clinic [facility]) Initial Outpatient Consultation  Name: Abigail Clark MRN: [id number]  Date: 01/15/2021  DOB: September 03, 1975  IO:[id number], [place], MD  Spero Geralds, MD   REFERRING PHYSICIAN: Spero Geralds, MD  DIAGNOSIS: The encounter diagnosis was Adenocarcinoma of right lung, stage 3 ([place]).  Right lung mass (patho. from 08/01 revealed rare malignant cells consistent with non-small cell carcinoma-adenocarcinoma)    ICD-10-CM   1. Adenocarcinoma of right lung, stage 3 ([place])  C34.91       HISTORY OF PRESENT ILLNESS::Abigail Clark is a 45 y.o. female who is being seen here today for consultation in regards to newly diagnosed right lung mass. She is accompanied here today by no one. She appears to be doing well overall.   The patient presented to the ED on 01/11/21 with complaints of right sided chest pain. According to the patient, she first noted having transient right chest pain this past July, which resurfaced 2 days prior to her ED visit. She noted the pain to be constant and radiating to her back and right shoulder and agitated with upper body movement. At the time of her ED visit, the patient rated her pain as 3/10. The patient was noted at the time to have associated SOB with dry cough and at times productive cough producing clear sputum.    Chest CT taken during ED encounter revealed a 10.0 cm right upper lobe mass suspicious for primary bronchogenic malignancy. The mass was noted to be obliterating the posterior segmental bronchus of the right upper lobe and encasing the right upper lobe pulmonary bronchus. An indeterminate lesion within the right hepatic lobe was of note as well (hepatic metastasis was not excluded).   The patient underwent bronchoscopy with bronchial biopsies on 01/12/21 under Dr. Shearon Stalls. Right upper lobe biopsy revealed rare malignant cells consistent with  non-small cell carcinoma and benign lung tissue. Fine needle aspiration of lymph node 7 revealed malignant cells consistent with non-small cell carcinoma as well.  Cytology consistent with adenocarcinoma  Chest CT taken on 01/12/21 further showed the right upper lobe mass (unchanged), as well as likely necrotic right hilar and possibly subcarinal lymph nodes. Distal to the mass, right upper lobe scattered pulmonary nodules were noted. Additionally, a 1.8 cm hypodense nodule within the right thyroid gland, and sub-centimeter hypodense nodules within the left thyroid gland were noted, as well as multiple hypodensities within the hepatic parenchyma (which may represent hepatic cysts vs metastases).   The patient was referred today for presentation in the multidisciplinary conference.  Radiology studies and pathology slides were presented there for review and discussion of treatment options.  A consensus was discussed regarding potential next steps.  PREVIOUS RADIATION THERAPY: No  PAST MEDICAL HISTORY:  has a past medical history of Cataract, Eczema, Exotropia of right eye (11/2014), Irritable bowel syndrome (IBS), and Lactose intolerance.    PAST SURGICAL HISTORY: Past Surgical History:  Procedure Laterality Date   BRONCHIAL BIOPSY  01/12/2021   Procedure: BRONCHIAL BIOPSIES;  Surgeon: Spero Geralds, MD;  Location: [facility] ENDOSCOPY;  Service: Pulmonary;;   BRONCHIAL NEEDLE ASPIRATION BIOPSY  01/12/2021   Procedure: BRONCHIAL NEEDLE ASPIRATION BIOPSIES;  Surgeon: Spero Geralds, MD;  Location: [facility] ENDOSCOPY;  Service: Pulmonary;;   CATARACT PEDIATRIC Right 1986   LEEP  05/20/2006   LIPOSUCTION  10/2014   abd. - local anes.   STRABISMUS SURGERY Right 11/29/2014  Procedure: REPAIR STRABISMUS RIGHT EYE ;  Surgeon: Everitt Amber, MD;  Location: [place];  Service: Ophthalmology;  Laterality: Right;   VIDEO BRONCHOSCOPY WITH ENDOBRONCHIAL ULTRASOUND N/A 01/12/2021   Procedure: VIDEO  BRONCHOSCOPY WITH ENDOBRONCHIAL ULTRASOUND;  Surgeon: Spero Geralds, MD;  Location: [facility] ENDOSCOPY;  Service: Pulmonary;  Laterality: N/A;   WISDOM TOOTH EXTRACTION      FAMILY HISTORY: family history includes Breast cancer in her maternal grandmother; Cancer in her maternal grandmother and paternal grandfather; Colon polyps in her mother; Diabetes in her father; Heart attack in her maternal grandfather.  SOCIAL HISTORY:  reports that she has been smoking cigarettes. She has a 1.75 pack-year smoking history. She has never used smokeless tobacco. She reports that she does not drink alcohol and does not use drugs.  ALLERGIES: Penicillins  MEDICATIONS:  Current Outpatient Medications  Medication Sig Dispense Refill   cholecalciferol (VITAMIN D3) 25 MCG (1000 UNIT) tablet Take 1,000 Units by mouth daily.     doxycycline (DORYX) 100 MG EC tablet Take 100 mg by mouth 2 (two) times daily. "For my face"     Multiple Vitamin (MULTIVITAMIN) tablet Take 1 tablet by mouth daily.     prochlorperazine (COMPAZINE) 10 MG tablet Take 1 tablet (10 mg total) by mouth every 6 (six) hours as needed for nausea or vomiting. 30 tablet 0   UNABLE TO FIND Sea moss, burdock root, macca,     vitamin C (ASCORBIC ACID) 250 MG tablet Take 250 mg by mouth daily.     vitamin E 1000 UNIT capsule Take by mouth daily.     No current facility-administered medications for this encounter.    REVIEW OF SYSTEMS:  REVIEW OF SYSTEMS: A 10+ POINT REVIEW OF SYSTEMS WAS OBTAINED including neurology, dermatology, psychiatry, cardiac, respiratory, lymph, extremities, GI, GU, musculoskeletal, constitutional, reproductive, HEENT. All pertinent positives are noted in the HPI. All others are negative.  She reports some pain in the chest area as well as some shortness of breath.  She also reports some mid back pain she denies any headaches dizziness or visual problems.   PHYSICAL EXAM:  Vitals - 1 value per visit 07/19/3662  SYSTOLIC 403   DIASTOLIC 97  Pulse 474  Temperature 97.6  Respirations 19  Weight (lb) 194  Height 5\' 7"   BMI 30.38  VISIT REPORT    General: Alert and oriented, in no acute distress HEENT: Head is normocephalic. Extraocular movements are intact.  Neck: Neck is supple, no palpable cervical or supraclavicular lymphadenopathy. Heart: Regular in rate and rhythm with no murmurs, rubs, or gallops. Chest: Clear to auscultation bilaterally, with no rhonchi, wheezes, or rales.  Mild wheezing noted in the right lung field. Abdomen: Soft, nontender, nondistended, with no rigidity or guarding. Extremities: No cyanosis or edema. Lymphatics: see Neck Exam Skin: No concerning lesions. Musculoskeletal: symmetric strength and muscle tone throughout. Neurologic: Cranial nerves II through XII are grossly intact. No obvious focalities. Speech is fluent. Coordination is intact. Psychiatric: Judgment and insight are intact. Affect is appropriate.    KPS = 90  100 - Normal; no complaints; no evidence of disease. 90   - Able to carry on normal activity; minor signs or symptoms of disease. 80   - Normal activity with effort; some signs or symptoms of disease. 48   - Cares for self; unable to carry on normal activity or to do active work. 60   - Requires occasional assistance, but is able to care for most of  his personal needs. 50   - Requires considerable assistance and frequent medical care. 64   - Disabled; requires special care and assistance. 32   - Severely disabled; hospital admission is indicated although death not imminent. 68   - Very sick; hospital admission necessary; active supportive treatment necessary. 10   - Moribund; fatal processes progressing rapidly. 0     - Dead  Karnofsky DA, Abelmann Nubieber, Craver LS and [facility] [phone]) The use of the nitrogen mustards in the palliative treatment of carcinoma: with particular reference to bronchogenic carcinoma Cancer 1 [id number]  LABORATORY DATA:  Lab  Results  Component Value Date   WBC 17.0 (H) 01/15/2021   HGB 12.6 01/15/2021   HCT 37.0 01/15/2021   MCV 85.8 01/15/2021   PLT 422 (H) 01/15/2021   Lab Results  Component Value Date   NA 139 01/15/2021   K 3.9 01/15/2021   CL 104 01/15/2021   CO2 22 01/15/2021   Lab Results  Component Value Date   ALT 13 01/15/2021   AST 15 01/15/2021   ALKPHOS 92 01/15/2021   BILITOT 0.6 01/15/2021    PULMONARY FUNCTION TEST:   Recent Review Flowsheet Data   There is no flowsheet data to display.     RADIOGRAPHY: DG Chest 2 View  Result Date: 01/11/2021 CLINICAL DATA:  Chest pain. Midsternal chest pain, fatigue and body aches. EXAM: CHEST - 2 VIEW COMPARISON:  None. FINDINGS: There is a large 9.6 x 9.1 x 10.8 cm mass in the region of the perihilar right upper lobe. Mild background bronchial thickening. No other focal airspace disease. The heart is normal in size. No pneumothorax or pleural effusion. No acute osseous abnormalities are seen. IMPRESSION: Large 9.6 x 9.1 x 10.8 cm mass in the region of the perihilar right upper lobe. Recommend chest CT for characterization (with IV contrast in the absence of contraindication). Mild background bronchial thickening. Electronically Signed   By: Keith Rake M.D.   On: 01/11/2021 20:42   CT Chest Wo Contrast  Result Date: 01/11/2021 CLINICAL DATA:  Midsternal chest pain, fatigue, abnormal chest x-ray EXAM: CT CHEST WITHOUT CONTRAST TECHNIQUE: Multidetector CT imaging of the chest was performed following the standard protocol without IV contrast. COMPARISON:  None. FINDINGS: Cardiovascular: No significant coronary artery calcification. Global cardiac size within normal limits. No pericardial effusion. The central pulmonary arteries are of normal caliber. Aberrant right subclavian artery noted. The thoracic aorta is otherwise unremarkable. Mediastinum/Nodes: There is pathologic right paratracheal adenopathy and color fluid right hilar adenopathy with  the central mass within the right upper lobe. Exact measurement is difficult given noncontrast technique. The esophagus is unremarkable. The thyroid is unremarkable. Lungs/Pleura: There is apically predominant cystic lung disease which likely reflects changes of moderate centrilobular emphysema. There is a right upper lobe mass which is confluent with the right hilum and encases the right upper lobe pulmonary bronchus measuring 9.7 x 10.0 x 8.3 cm in greatest dimension on axial image # 49/4 and coronal image # 81/6 suspicious for a primary bronchogenic neoplasm. No pneumothorax or pleural effusion. The posterior segmental branch of the right upper lobe appears obliterated by the mass. The airways are otherwise widely patent. Upper Abdomen: Several scattered hypodensities are noted within the visualized right hepatic lobe including a 2.1 cm nodule within the a posterior right hepatic lobe at axial image # 140/3. This is not well characterized on this examination, however, hepatic metastasis is not excluded. No acute abnormality identified. Musculoskeletal: No acute  bone abnormality. No lytic or blastic bone lesion identified. IMPRESSION: 10.0 cm right upper lobe pulmonary mass obliterating the posterior segmental bronchus of the right upper lobe and encasing the right upper lobe pulmonary bronchus centrally suspicious for a primary bronchogenic malignancy. Associated confluent right hilar and right paratracheal adenopathy. Indeterminate lesion within the right hepatic lobe noted and hepatic metastasis is not excluded. CT examination of the chest, abdomen, and pelvis with contrast would be helpful for initial staging purposes. PET CT examination may also be helpful both for staging purposes as well as determination of a tissue sampling strategy. Cystic lung disease most in keeping with moderate centrilobular emphysema. Emphysema (ICD10-J43.9). Electronically Signed   By: Fidela Salisbury MD   On: 01/11/2021 22:27   CT  CHEST ABDOMEN PELVIS W CONTRAST  Result Date: 01/12/2021 CLINICAL DATA:  Mediastinal mass. EXAM: CT CHEST, ABDOMEN, AND PELVIS WITH CONTRAST TECHNIQUE: Multidetector CT imaging of the chest, abdomen and pelvis was performed following the standard protocol during bolus administration of intravenous contrast. CONTRAST:  137mL OMNIPAQUE IOHEXOL 350 MG/ML SOLN COMPARISON:  None. FINDINGS: CT CHEST FINDINGS Cardiovascular: Normal heart size. No significant pericardial effusion. The thoracic aorta is normal in caliber. No atherosclerotic plaque of the thoracic aorta. No coronary artery calcifications. Mediastinum/Nodes: Slightly more hypodense soft tissue density along the right hilum could represent necrotic lymph nodes (3:31). Similar subcarinal finding (3:35). No enlarged left hilar or axillary lymph nodes. There is a 1.8 cm hypodense nodule within the right thyroid gland. Subcentimeter hypodense nodules within the left thyroid gland. The trachea and esophagus demonstrate no significant findings. Lungs/Pleura: At least moderate centrilobular emphysematous changes. Redemonstration of a right upper lobe, 10 x 9 x 8.5 cm, heterogeneous vascular peribronchovascular lesion. Associated marked narrowing/almost occlusion of right pulmonary artery superior trunk and narrowing of the interlobular right pulmonary artery. Associated encasement and marked narrowing of the right upper lobe bronchus due to external mass effect. Distal to the mass there are right upper lobe centrilobular punctate pulmonary nodule. There is also a right upper lobe 1.1 cm nodule (4:46). Also noted is interlobular septal thickening at the right apex. No pleural effusion.  No pneumothorax. Musculoskeletal: No chest wall abnormality. No suspicious lytic or blastic osseous lesions. No acute displaced fracture. CT ABDOMEN PELVIS FINDINGS Hepatobiliary: Redemonstration of several subcentimeter hypodensities within the hepatic parenchyma (3: 57, 58, 64, 74,  77). There is a 1.5 cm fluid density lesion within the right hepatic lobe that may represent a simple renal cyst. Hyperdensity within the gallbladder lumen likely represents a gallstone (3:77). No gallbladder wall thickening or pericholecystic fluid. No biliary dilatation. Pancreas: No focal lesion. Normal pancreatic contour. No surrounding inflammatory changes. No main pancreatic ductal dilatation. Spleen: Normal in size without focal abnormality. Adrenals/Urinary Tract: No adrenal nodule bilaterally. Bilateral kidneys enhance symmetrically. No hydronephrosis. No hydroureter. The urinary bladder is unremarkable. On delayed imaging, there is no urothelial wall thickening and there are no filling defects in the opacified portions of the bilateral collecting systems or ureters. Stomach/Bowel: Stomach is within normal limits. No evidence of bowel wall thickening or dilatation. Appendix appears normal. Vascular/Lymphatic: No abdominal aorta or iliac aneurysm. No abdominal, pelvic, or inguinal lymphadenopathy. Reproductive: Uterus and bilateral adnexa are unremarkable. Other: No intraperitoneal free fluid. No intraperitoneal free gas. No organized fluid collection. Musculoskeletal: No abdominal wall hernia or abnormality. Slight fatty atrophy of the left gluteal musculature compared to the right. No suspicious lytic or blastic osseous lesions. No acute displaced fracture. IMPRESSION: 1. Right upper lobe,  10 x 9 x 8.5 cm, heterogeneous vascular peribronchovascular lesion. Associated marked narrowing/almost occlusion of right pulmonary artery superior trunk and narrowing of the interlobular right pulmonary artery. Encasement and narrowing of the right upper lobe bronchus. Additional imaging evaluation or consultation with Pulmonology or Thoracic Surgery recommended. 2. Likely necrotic right hilar and possibly subcarinal lymph nodes. 3. Distal to the mass there are right upper lobe scattered pulmonary nodules with  postobstructive infection/inflammation not excluded. 4. Indeterminate left upper lobe pulmonary micronodule. 5. A 1.8 cm hypodense nodule within the right thyroid gland. Subcentimeter hypodense nodules within the left thyroid gland. Recommend thyroid US (ref: J Am Coll Radiol. 2015 Feb;12(2): 143-50). 6. Emphysema (ICD10-J43.9). 7. Multiple hypodensities within the hepatic parenchyma may represent hepatic cysts versus metastases. 8. Cholelithiasis with no CT evidence of acute cholecystitis or choledocholithiasis. Electronically Signed   By: Iven Finn M.D.   On: 01/12/2021 03:28      IMPRESSION: Stage III adenocarcinoma of the lung.  Patient will complete her staging work-up in the near future with PET scan and MRI of the brain ordered today.  If she does have distant metastasis I would still recommend a short course of radiation therapy directed at the large right upper lobe lung mass given the effects on the bronchial tree and arteries in this area and postobstructive changes.  I discussed the general course of treatment, side effects and potential long-term toxicities associated with the use of high-dose radiation therapy directed at the chest with the patient in detail.  She appears to understand and wished to proceed with planned course of treatment.  PLAN: She will return on Monday, August 8 for CT simulation with treatment to begin a week later concomitant with radiosensitizing chemotherapy.  Anticipate 6 weeks of radiation therapy directed to the chest region unless staging work-up reveals metastasis.  In this situation I would recommend approximately 3 weeks of palliative radiation therapy directed to the chest.  I spent 60 minutes minutes face to face with the patient and more than 50% of that time was spent in counseling and/or coordination of care.   ------------------------------------------------  Blair Promise, PhD, MD  This document serves as a record of services personally  performed by Gery Pray, MD. It was created on his behalf by Roney Mans, a trained medical scribe. The creation of this record is based on the scribe's personal observations and the provider's statements to them. This document has been checked and approved by the attending provider.

## 2021-01-14 NOTE — Telephone Encounter (Signed)
Spoke to patient to confirm appointment with Dr. Julien Nordmann and Dr. Sondra Come tomorrow 8/4, patient understands arrival time ad where to go, no further questions asked

## 2021-01-15 ENCOUNTER — Other Ambulatory Visit: Payer: Self-pay | Admitting: Medical Oncology

## 2021-01-15 ENCOUNTER — Other Ambulatory Visit: Payer: Self-pay

## 2021-01-15 ENCOUNTER — Encounter: Payer: Self-pay | Admitting: Internal Medicine

## 2021-01-15 ENCOUNTER — Inpatient Hospital Stay (HOSPITAL_BASED_OUTPATIENT_CLINIC_OR_DEPARTMENT_OTHER): Payer: 59 | Admitting: Internal Medicine

## 2021-01-15 ENCOUNTER — Inpatient Hospital Stay: Payer: 59

## 2021-01-15 ENCOUNTER — Ambulatory Visit
Admission: RE | Admit: 2021-01-15 | Discharge: 2021-01-15 | Disposition: A | Discharge status: Home / Self Care | Payer: 59 | Source: Ambulatory Visit | Attending: Radiation Oncology | Admitting: Radiation Oncology

## 2021-01-15 VITALS — BP 119/97 | HR 116 | Temp 97.6°F | Resp 19 | Ht 67.0 in | Wt 194.0 lb

## 2021-01-15 DIAGNOSIS — Z803 Family history of malignant neoplasm of breast: Secondary | ICD-10-CM | POA: Diagnosis not present

## 2021-01-15 DIAGNOSIS — C3490 Malignant neoplasm of unspecified part of unspecified bronchus or lung: Secondary | ICD-10-CM

## 2021-01-15 DIAGNOSIS — R918 Other nonspecific abnormal finding of lung field: Secondary | ICD-10-CM

## 2021-01-15 DIAGNOSIS — Z87891 Personal history of nicotine dependence: Secondary | ICD-10-CM

## 2021-01-15 DIAGNOSIS — K589 Irritable bowel syndrome without diarrhea: Secondary | ICD-10-CM | POA: Insufficient documentation

## 2021-01-15 DIAGNOSIS — Z79899 Other long term (current) drug therapy: Secondary | ICD-10-CM | POA: Insufficient documentation

## 2021-01-15 DIAGNOSIS — C3491 Malignant neoplasm of unspecified part of right bronchus or lung: Secondary | ICD-10-CM | POA: Diagnosis not present

## 2021-01-15 DIAGNOSIS — C3411 Malignant neoplasm of upper lobe, right bronchus or lung: Secondary | ICD-10-CM | POA: Insufficient documentation

## 2021-01-15 DIAGNOSIS — Z5111 Encounter for antineoplastic chemotherapy: Secondary | ICD-10-CM

## 2021-01-15 DIAGNOSIS — F17210 Nicotine dependence, cigarettes, uncomplicated: Secondary | ICD-10-CM | POA: Insufficient documentation

## 2021-01-15 DIAGNOSIS — Z510 Encounter for antineoplastic radiation therapy: Secondary | ICD-10-CM | POA: Diagnosis present

## 2021-01-15 LAB — CMP (CANCER CENTER ONLY)
ALT: 13 U/L (ref 0–44)
AST: 15 U/L (ref 15–41)
Albumin: 3.8 g/dL (ref 3.5–5.0)
Alkaline Phosphatase: 92 U/L (ref 38–126)
Anion gap: 13 (ref 5–15)
BUN: 7 mg/dL (ref 6–20)
CO2: 22 mmol/L (ref 22–32)
Calcium: 10.4 mg/dL — ABNORMAL HIGH (ref 8.9–10.3)
Chloride: 104 mmol/L (ref 98–111)
Creatinine: 0.81 mg/dL (ref 0.44–1.00)
GFR, Estimated: >60 mL/min (ref >60)
Glucose, Bld: 129 mg/dL — ABNORMAL HIGH (ref 70–99)
Potassium: 3.9 mmol/L (ref 3.5–5.1)
Sodium: 139 mmol/L (ref 135–145)
Total Bilirubin: 0.6 mg/dL (ref 0.3–1.2)
Total Protein: 8.8 g/dL — ABNORMAL HIGH (ref 6.5–8.1)

## 2021-01-15 LAB — CBC WITH DIFFERENTIAL (CANCER CENTER ONLY)
Abs Immature Granulocytes: 0.07 10*3/uL (ref 0.00–0.07)
Basophils Absolute: 0.1 10*3/uL (ref 0.0–0.1)
Basophils Relative: 0 %
Eosinophils Absolute: 0.2 10*3/uL (ref 0.0–0.5)
Eosinophils Relative: 1 %
HCT: 37 % (ref 36.0–46.0)
Hemoglobin: 12.6 g/dL (ref 12.0–15.0)
Immature Granulocytes: 0 %
Lymphocytes Relative: 14 %
Lymphs Abs: 2.4 10*3/uL (ref 0.7–4.0)
MCH: 29.2 pg (ref 26.0–34.0)
MCHC: 34.1 g/dL (ref 30.0–36.0)
MCV: 85.8 fL (ref 80.0–100.0)
Monocytes Absolute: 1 10*3/uL (ref 0.1–1.0)
Monocytes Relative: 6 %
Neutro Abs: 13.3 10*3/uL — ABNORMAL HIGH (ref 1.7–7.7)
Neutrophils Relative %: 79 %
Platelet Count: 422 10*3/uL — ABNORMAL HIGH (ref 150–400)
RBC: 4.31 MIL/uL (ref 3.87–5.11)
RDW: 13.5 % (ref 11.5–15.5)
WBC Count: 17 10*3/uL — ABNORMAL HIGH (ref 4.0–10.5)
nRBC: 0 % (ref 0.0–0.2)

## 2021-01-15 MED ORDER — PROCHLORPERAZINE MALEATE 10 MG PO TABS: 10.0000 mg | ORAL_TABLET | Freq: Four times a day (QID) | ORAL | 0 refills | Status: AC | PRN

## 2021-01-15 NOTE — Progress Notes (Signed)
[place] [phone]   [phone] Multidisciplinary thoracic oncology clinic CONSULT NOTE  REFERRING PHYSICIAN: Dr. Lenice Llamas  REASON FOR CONSULTATION:  45 years old African-American female recently diagnosed with lung cancer  HPI Abigail Clark is a 45 y.o. female with past medical history significant for irritable bowel syndrome, lactose intolerance, exotropia of the right eye as well as cataract surgery and eczema.  The patient also has a short history of smoking for around 12 years and quit a month ago.  She presented to the emergency department on January 11, 2021 complaining of midsternal chest pain with shortness of breath and cough as well as fatigue that has been going on for few days.  During her evaluation she had chest x-ray on January 11, 2021 and it showed a large 9.6 x 9.1 x 10.8 cm mass in the region of the perihilar right upper lobe.  This was followed by CT scan of the chest without contrast on the same day and that showed 10.0 cm right upper lobe pulmonary mass obliterating the posterior segmental bronchus of the right upper lobe and encasing the right upper lobe pulmonary bronchus centrally suspicious for primary bronchogenic malignancy with associated confluent right hilar and right paratracheal adenopathy.  On January 12, 2021 the patient underwent CT scan of the chest, abdomen pelvis with contrast and that showed the right upper lobe measuring 10.0 x 9.0 x 8.5 cm heterogeneous vascular peribronchovascular lesion with associated marked narrowing and almost occlusion of the right pulmonary artery superior trunk and narrowing of the anterior lobular right pulmonary artery with encasement and narrowing of the right upper lobe bronchus.  There was likely necrotic right hilar and possibly subcarinal lymph nodes.  Distal to the mass there are right upper lobe scattered pulmonary nodules with postobstructive infection/inflammation not excluded.  There  was also indeterminate left upper lobe pulmonary micronodule and multiple hypodensities within the hepatic parenchyma may represent hepatic cyst versus metastasis. On January 12, 2021 the patient underwent bronchoscopy under the care of Dr. Shearon Stalls and the final pathology ([id number]) showed malignant cells consistent with non-small cell carcinoma. Immunohistochemistry is positive for TTF-1. The cells are negative for  cytokeratin 5/6, p40, and NapsinA. The immunoprofile is consistent with a lung adenocarcinoma. Dr. Shearon Stalls kindly referred the patient to the multidisciplinary thoracic oncology clinic today for evaluation and recommendation regarding her condition. When seen today the patient is feeling fine with no concerning complaints except for back pain that has been going on for few months in addition to the intermittent substernal chest pain for the last 6 months.  She also has mild cough but no shortness of breath or hemoptysis.  She has no significant weight loss or night sweats.  She has no nausea, vomiting, diarrhea or constipation.  She has intermittent headache with no current visual changes. Family history significant for mother with colon cancer.  Father had diabetes mellitus and maternal grandmother had breast cancer. The patient is single and has 3 children aged between 69-18.  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  She has a history of smoking less than 1 pack/day for around 12 years and quit 1 month ago.  She has no history of alcohol or drug abuse.  HPI  Past Medical History:  Diagnosis Date   Cataract    RIGHT EYE   Eczema    Exotropia of right eye 11/2014   Irritable bowel syndrome (IBS)    no current med.  Lactose intolerance     Past Surgical History:  Procedure Laterality Date   BRONCHIAL BIOPSY  01/12/2021   Procedure: BRONCHIAL BIOPSIES;  Surgeon: Spero Geralds, MD;  Location: [facility] ENDOSCOPY;  Service: Pulmonary;;   BRONCHIAL NEEDLE ASPIRATION  BIOPSY  01/12/2021   Procedure: BRONCHIAL NEEDLE ASPIRATION BIOPSIES;  Surgeon: Spero Geralds, MD;  Location: [facility] ENDOSCOPY;  Service: Pulmonary;;   CATARACT PEDIATRIC Right 1986   LEEP  05/20/2006   LIPOSUCTION  10/2014   abd. - local anes.   STRABISMUS SURGERY Right 11/29/2014   Procedure: REPAIR STRABISMUS RIGHT EYE ;  Surgeon: Everitt Amber, MD;  Location: [place];  Service: Ophthalmology;  Laterality: Right;   VIDEO BRONCHOSCOPY WITH ENDOBRONCHIAL ULTRASOUND N/A 01/12/2021   Procedure: VIDEO BRONCHOSCOPY WITH ENDOBRONCHIAL ULTRASOUND;  Surgeon: Spero Geralds, MD;  Location: [facility] ENDOSCOPY;  Service: Pulmonary;  Laterality: N/A;   WISDOM TOOTH EXTRACTION      Family History  Problem Relation Age of Onset   Colon polyps Mother    Diabetes Father    Cancer Maternal Grandmother    Breast cancer Maternal Grandmother    Heart attack Maternal Grandfather    Cancer Paternal Grandfather    Colon cancer Neg Hx    Esophageal cancer Neg Hx    Stomach cancer Neg Hx    Rectal cancer Neg Hx     Social History Social History   Tobacco Use   Smoking status: Every Day    Packs/day: 0.25    Years: 7.00    Pack years: 1.75    Types: Cigarettes   Smokeless tobacco: Never   Tobacco comments:    7 cig./day  Vaping Use   Vaping Use: Never used  Substance Use Topics   Alcohol use: No   Drug use: No    Allergies  Allergen Reactions   Penicillins Swelling    Current Outpatient Medications  Medication Sig Dispense Refill   cholecalciferol (VITAMIN D3) 25 MCG (1000 UNIT) tablet Take 1,000 Units by mouth daily.     Multiple Vitamin (MULTIVITAMIN) tablet Take 1 tablet by mouth daily.     UNABLE TO FIND Sea moss, burdock root, macca,     vitamin C (ASCORBIC ACID) 250 MG tablet Take 250 mg by mouth daily.     vitamin E 1000 UNIT capsule Take by mouth daily.     No current facility-administered medications for this visit.    Review of Systems  Constitutional: positive  for fatigue Eyes: negative Ears, nose, mouth, throat, and face: negative Respiratory: positive for cough and pleurisy/chest pain Cardiovascular: negative Gastrointestinal: negative Genitourinary:negative Integument/breast: negative Hematologic/lymphatic: negative Musculoskeletal:positive for back pain Neurological: negative Behavioral/Psych: negative Endocrine: negative Allergic/Immunologic: negative  Physical Exam  [id number], healthy, no distress, well nourished, well developed, and anxious SKIN: skin color, texture, turgor are normal, no rashes or significant lesions HEAD: Normocephalic, No masses, lesions, tenderness or abnormalities EYES: normal, PERRLA, Conjunctiva are pink and non-injected EARS: External ears normal, Canals clear OROPHARYNX:no exudate, no erythema, and lips, buccal mucosa, and tongue normal  NECK: supple, no adenopathy, no JVD LYMPH:  no palpable lymphadenopathy, no hepatosplenomegaly BREAST:not examined LUNGS: clear to auscultation , and palpation HEART: regular rate & rhythm, no murmurs, and no gallops ABDOMEN:abdomen soft, non-tender, normal bowel sounds, and no masses or organomegaly BACK: No CVA tenderness, Range of motion is normal EXTREMITIES:no joint deformities, effusion, or inflammation, no edema  NEURO: alert & oriented x 3 with fluent speech, no focal motor/sensory deficits  PERFORMANCE  STATUS: ECOG 1  LABORATORY DATA: Lab Results  Component Value Date   WBC 17.0 (H) 01/15/2021   HGB 12.6 01/15/2021   HCT 37.0 01/15/2021   MCV 85.8 01/15/2021   PLT 422 (H) 01/15/2021      Chemistry      Component Value Date/Time   NA 136 01/11/2021 2006   K 3.4 (L) 01/11/2021 2006   CL 105 01/11/2021 2006   CO2 22 01/11/2021 2006   BUN 8 01/11/2021 2006   CREATININE 0.87 01/11/2021 2006      Component Value Date/Time   CALCIUM 9.2 01/11/2021 2006   ALKPHOS 76 01/11/2021 2006   AST 17 01/11/2021 2006   ALT 13 01/11/2021 2006   BILITOT 0.5  01/11/2021 2006       RADIOGRAPHIC STUDIES: DG Chest 2 View  Result Date: 01/11/2021 CLINICAL DATA:  Chest pain. Midsternal chest pain, fatigue and body aches. EXAM: CHEST - 2 VIEW COMPARISON:  None. FINDINGS: There is a large 9.6 x 9.1 x 10.8 cm mass in the region of the perihilar right upper lobe. Mild background bronchial thickening. No other focal airspace disease. The heart is normal in size. No pneumothorax or pleural effusion. No acute osseous abnormalities are seen. IMPRESSION: Large 9.6 x 9.1 x 10.8 cm mass in the region of the perihilar right upper lobe. Recommend chest CT for characterization (with IV contrast in the absence of contraindication). Mild background bronchial thickening. Electronically Signed   By: Keith Rake M.D.   On: 01/11/2021 20:42   CT Chest Wo Contrast  Result Date: 01/11/2021 CLINICAL DATA:  Midsternal chest pain, fatigue, abnormal chest x-ray EXAM: CT CHEST WITHOUT CONTRAST TECHNIQUE: Multidetector CT imaging of the chest was performed following the standard protocol without IV contrast. COMPARISON:  None. FINDINGS: Cardiovascular: No significant coronary artery calcification. Global cardiac size within normal limits. No pericardial effusion. The central pulmonary arteries are of normal caliber. Aberrant right subclavian artery noted. The thoracic aorta is otherwise unremarkable. Mediastinum/Nodes: There is pathologic right paratracheal adenopathy and color fluid right hilar adenopathy with the central mass within the right upper lobe. Exact measurement is difficult given noncontrast technique. The esophagus is unremarkable. The thyroid is unremarkable. Lungs/Pleura: There is apically predominant cystic lung disease which likely reflects changes of moderate centrilobular emphysema. There is a right upper lobe mass which is confluent with the right hilum and encases the right upper lobe pulmonary bronchus measuring 9.7 x 10.0 x 8.3 cm in greatest dimension on axial  image # 49/4 and coronal image # 81/6 suspicious for a primary bronchogenic neoplasm. No pneumothorax or pleural effusion. The posterior segmental branch of the right upper lobe appears obliterated by the mass. The airways are otherwise widely patent. Upper Abdomen: Several scattered hypodensities are noted within the visualized right hepatic lobe including a 2.1 cm nodule within the a posterior right hepatic lobe at axial image # 140/3. This is not well characterized on this examination, however, hepatic metastasis is not excluded. No acute abnormality identified. Musculoskeletal: No acute bone abnormality. No lytic or blastic bone lesion identified. IMPRESSION: 10.0 cm right upper lobe pulmonary mass obliterating the posterior segmental bronchus of the right upper lobe and encasing the right upper lobe pulmonary bronchus centrally suspicious for a primary bronchogenic malignancy. Associated confluent right hilar and right paratracheal adenopathy. Indeterminate lesion within the right hepatic lobe noted and hepatic metastasis is not excluded. CT examination of the chest, abdomen, and pelvis with contrast would be helpful for initial staging purposes. PET  CT examination may also be helpful both for staging purposes as well as determination of a tissue sampling strategy. Cystic lung disease most in keeping with moderate centrilobular emphysema. Emphysema (ICD10-J43.9). Electronically Signed   By: Fidela Salisbury MD   On: 01/11/2021 22:27   CT CHEST ABDOMEN PELVIS W CONTRAST  Result Date: 01/12/2021 CLINICAL DATA:  Mediastinal mass. EXAM: CT CHEST, ABDOMEN, AND PELVIS WITH CONTRAST TECHNIQUE: Multidetector CT imaging of the chest, abdomen and pelvis was performed following the standard protocol during bolus administration of intravenous contrast. CONTRAST:  110m OMNIPAQUE IOHEXOL 350 MG/ML SOLN COMPARISON:  None. FINDINGS: CT CHEST FINDINGS Cardiovascular: Normal heart size. No significant pericardial effusion. The  thoracic aorta is normal in caliber. No atherosclerotic plaque of the thoracic aorta. No coronary artery calcifications. Mediastinum/Nodes: Slightly more hypodense soft tissue density along the right hilum could represent necrotic lymph nodes (3:31). Similar subcarinal finding (3:35). No enlarged left hilar or axillary lymph nodes. There is a 1.8 cm hypodense nodule within the right thyroid gland. Subcentimeter hypodense nodules within the left thyroid gland. The trachea and esophagus demonstrate no significant findings. Lungs/Pleura: At least moderate centrilobular emphysematous changes. Redemonstration of a right upper lobe, 10 x 9 x 8.5 cm, heterogeneous vascular peribronchovascular lesion. Associated marked narrowing/almost occlusion of right pulmonary artery superior trunk and narrowing of the interlobular right pulmonary artery. Associated encasement and marked narrowing of the right upper lobe bronchus due to external mass effect. Distal to the mass there are right upper lobe centrilobular punctate pulmonary nodule. There is also a right upper lobe 1.1 cm nodule (4:46). Also noted is interlobular septal thickening at the right apex. No pleural effusion.  No pneumothorax. Musculoskeletal: No chest wall abnormality. No suspicious lytic or blastic osseous lesions. No acute displaced fracture. CT ABDOMEN PELVIS FINDINGS Hepatobiliary: Redemonstration of several subcentimeter hypodensities within the hepatic parenchyma (3: 57, 58, 64, 74, 77). There is a 1.5 cm fluid density lesion within the right hepatic lobe that may represent a simple renal cyst. Hyperdensity within the gallbladder lumen likely represents a gallstone (3:77). No gallbladder wall thickening or pericholecystic fluid. No biliary dilatation. Pancreas: No focal lesion. Normal pancreatic contour. No surrounding inflammatory changes. No main pancreatic ductal dilatation. Spleen: Normal in size without focal abnormality. Adrenals/Urinary Tract: No  adrenal nodule bilaterally. Bilateral kidneys enhance symmetrically. No hydronephrosis. No hydroureter. The urinary bladder is unremarkable. On delayed imaging, there is no urothelial wall thickening and there are no filling defects in the opacified portions of the bilateral collecting systems or ureters. Stomach/Bowel: Stomach is within normal limits. No evidence of bowel wall thickening or dilatation. Appendix appears normal. Vascular/Lymphatic: No abdominal aorta or iliac aneurysm. No abdominal, pelvic, or inguinal lymphadenopathy. Reproductive: Uterus and bilateral adnexa are unremarkable. Other: No intraperitoneal free fluid. No intraperitoneal free gas. No organized fluid collection. Musculoskeletal: No abdominal wall hernia or abnormality. Slight fatty atrophy of the left gluteal musculature compared to the right. No suspicious lytic or blastic osseous lesions. No acute displaced fracture. IMPRESSION: 1. Right upper lobe, 10 x 9 x 8.5 cm, heterogeneous vascular peribronchovascular lesion. Associated marked narrowing/almost occlusion of right pulmonary artery superior trunk and narrowing of the interlobular right pulmonary artery. Encasement and narrowing of the right upper lobe bronchus. Additional imaging evaluation or consultation with Pulmonology or Thoracic Surgery recommended. 2. Likely necrotic right hilar and possibly subcarinal lymph nodes. 3. Distal to the mass there are right upper lobe scattered pulmonary nodules with postobstructive infection/inflammation not excluded. 4. Indeterminate left upper lobe pulmonary  micronodule. 5. A 1.8 cm hypodense nodule within the right thyroid gland. Subcentimeter hypodense nodules within the left thyroid gland. Recommend thyroid US (ref: J Am Coll Radiol. 2015 Feb;12(2): 143-50). 6. Emphysema (ICD10-J43.9). 7. Multiple hypodensities within the hepatic parenchyma may represent hepatic cysts versus metastases. 8. Cholelithiasis with no CT evidence of acute  cholecystitis or choledocholithiasis. Electronically Signed   By: Iven Finn M.D.   On: 01/12/2021 03:28    ASSESSMENT: This is a very pleasant 45 years old African-American female recently diagnosed with stage IIIb (T4, N2, MX) non-small cell lung cancer, adenocarcinoma presented with large right upper lobe lung mass with associated narrowing and occlusion of the right pulmonary artery superior trunk as well as narrowing of the interlobular right pulmonary artery and encasement and narrowing of the right upper lobe bronchus in addition to right hilar and subcarinal lymphadenopathy and suspicious lesion in the liver pending further staging work-up and imaging studies.  This was diagnosed in August 2022.   PLAN: I had a lengthy discussion with the patient today about her current disease stage, prognosis and treatment options. I personally and independently reviewed the scan images and discussed the result with the patient today. I recommended for her to complete the staging work-up by ordering a PET scan as well as MRI of the brain to rule out brain metastasis. I will also send the blood test to [facility] for molecular studies followed by tissue next and PD-L1 if the blood test is negative. If the patient has no evidence for disease progression on the upcoming staging work-up, we will treat her with a course of concurrent chemoradiation with weekly carboplatin for AUC of 2 and paclitaxel 45 Mg/M2 for 6-7 weeks followed by treatment with consolidation immunotherapy as the patient has no evidence for disease progression after the induction phase. I discussed with the patient the adverse effect of the chemotherapy including but not limited to alopecia, myelosuppression, nausea and vomiting, peripheral neuropathy, liver or renal dysfunction. The patient is interested in proceeding with the treatment and she will have a chemotherapy education class before the first dose of her treatment. She will see  Dr. Sondra Come later today for evaluation and discussion of the radiotherapy option. I will see the patient back for follow-up visit on January 26, 2021 with the start of the first cycle of her treatment. I strongly encouraged the patient to continue with the smoke cessation. The patient was advised to call immediately if she has any other concerning symptoms in the interval. The patient voices understanding of current disease status and treatment options and is in agreement with the current care plan.  All questions were answered. The patient knows to call the clinic with any problems, questions or concerns. We can certainly see the patient much sooner if necessary.  Thank you so much for allowing me to participate in the care of [name]. I will continue to follow up the patient with you and assist in her care.  The total time spent in the appointment was 90 minutes.  Disclaimer: This note was dictated with voice recognition software. Similar sounding words can inadvertently be transcribed and may not be corrected upon review.   Eilleen Kempf January 15, 2021, 12:46 PM

## 2021-01-15 NOTE — Patient Instructions (Signed)
Steps to Quit Smoking Smoking tobacco is the leading cause of preventable death. It can affect almost every organ in the body. Smoking puts you and people around you at risk for many serious, long-lasting (chronic) diseases. Quitting smoking can be hard, but it is one of the best things that you can do for your health. It is never too late to quit. How do I get ready to quit? When you decide to quit smoking, make a plan to help you succeed. Before you quit: Pick a date to quit. Set a date within the next 2 weeks to give you time to prepare. Write down the reasons why you are quitting. Keep this list in places where you will see it often. Tell your family, friends, and co-workers that you are quitting. Their support is important. Talk with your doctor about the choices that may help you quit. Find out if your health insurance will pay for these treatments. Know the people, places, things, and activities that make you want to smoke (triggers). Avoid them. What first steps can I take to quit smoking? Throw away all cigarettes at home, at work, and in your car. Throw away the things that you use when you smoke, such as ashtrays and lighters. Clean your car. Make sure to empty the ashtray. Clean your home, including curtains and carpets. What can I do to help me quit smoking? Talk with your doctor about taking medicines and seeing a counselor at the same time. You are more likely to succeed when you do both. If you are pregnant or breastfeeding, talk with your doctor about counseling or other ways to quit smoking. Do not take medicine to help you quit smoking unless your doctor tells you to do so. To quit smoking: Quit right away Quit smoking totally, instead of slowly cutting back on how much you smoke over a period of time. Go to counseling. You are more likely to quit if you go to counseling sessions regularly. Take medicine You may take medicines to help you quit. Some medicines need a  prescription, and some you can buy over-the-counter. Some medicines may contain a drug called nicotine to replace the nicotine in cigarettes. Medicines may: Help you to stop having the desire to smoke (cravings). Help to stop the problems that come when you stop smoking (withdrawal symptoms). Your doctor may ask you to use: Nicotine patches, gum, or lozenges. Nicotine inhalers or sprays. Non-nicotine medicine that is taken by mouth. Find resources Find resources and other ways to help you quit smoking and remain smoke-free after you quit. These resources are most helpful when you use them often. They include: Online chats with a counselor. Phone quitlines. Printed self-help materials. Support groups or group counseling. Text messaging programs. Mobile phone apps. Use apps on your mobile phone or tablet that can help you stick to your quit plan. There are many free apps for mobile phones and tablets as well as websites. Examples include Quit Guide from the CDC and smokefree.gov  What things can I do to make it easier to quit?  Talk to your family and friends. Ask them to support and encourage you. Call a phone quitline (1-800-QUIT-NOW), reach out to support groups, or work with a counselor. Ask people who smoke to not smoke around you. Avoid places that make you want to smoke, such as: Bars. Parties. Smoke-break areas at work. Spend time with people who do not smoke. Lower the stress in your life. Stress can make you want to   smoke. Try these things to help your stress: Getting regular exercise. Doing deep-breathing exercises. Doing yoga. Meditating. Doing a body scan. To do this, close your eyes, focus on one area of your body at a time from head to toe. Notice which parts of your body are tense. Try to relax the muscles in those areas. How will I feel when I quit smoking? Day 1 to 3 weeks Within the first 24 hours, you may start to have some problems that come from quitting tobacco.  These problems are very bad 2-3 days after you quit, but they do not often last for more than 2-3 weeks. You may get these symptoms: Mood swings. Feeling restless, nervous, angry, or annoyed. Trouble concentrating. Dizziness. Strong desire for high-sugar foods and nicotine. Weight gain. Trouble pooping (constipation). Feeling like you may vomit (nausea). Coughing or a sore throat. Changes in how the medicines that you take for other issues work in your body. Depression. Trouble sleeping (insomnia). Week 3 and afterward After the first 2-3 weeks of quitting, you may start to notice more positive results, such as: Better sense of smell and taste. Less coughing and sore throat. Slower heart rate. Lower blood pressure. Clearer skin. Better breathing. Fewer sick days. Quitting smoking can be hard. Do not give up if you fail the first time. Some people need to try a few times before they succeed. Do your best to stick to your quit plan, and talk with your doctor if you have any questions or concerns. Summary Smoking tobacco is the leading cause of preventable death. Quitting smoking can be hard, but it is one of the best things that you can do for your health. When you decide to quit smoking, make a plan to help you succeed. Quit smoking right away, not slowly over a period of time. When you start quitting, seek help from your doctor, family, or friends. This information is not intended to replace advice given to you by your health care provider. Make sure you discuss any questions you have with your health care provider. Document Revised: 02/23/2019 Document Reviewed: 08/19/2018 Elsevier Patient Education  2022 [employer]  

## 2021-01-15 NOTE — Progress Notes (Signed)

## 2021-01-16 ENCOUNTER — Other Ambulatory Visit: Payer: Self-pay | Admitting: *Deleted

## 2021-01-16 NOTE — Progress Notes (Signed)
Encounter opened in error

## 2021-01-16 NOTE — Progress Notes (Signed)
Patient taken back to ED from endo, unable to transfer her to the ED bed in the computer due to technical issues. Patient transferred to a hallway bed in ED. ED staff came to endo to try to transfer her in computer and unable. Received call from ED later that pt was leaving AMA but unable to discharge from computer bc she wasn't on their board. Asked to discharge from our snapboard because leaving AMA. Discharged using the option "AMA" in box of discharge disposition.

## 2021-01-19 ENCOUNTER — Ambulatory Visit
Admission: RE | Admit: 2021-01-19 | Discharge: 2021-01-19 | Disposition: A | Discharge status: Home / Self Care | Payer: 59 | Source: Ambulatory Visit | Attending: Radiation Oncology | Admitting: Radiation Oncology

## 2021-01-19 ENCOUNTER — Other Ambulatory Visit: Payer: Self-pay

## 2021-01-19 ENCOUNTER — Telehealth: Payer: Self-pay | Admitting: Internal Medicine

## 2021-01-19 DIAGNOSIS — K589 Irritable bowel syndrome without diarrhea: Secondary | ICD-10-CM | POA: Insufficient documentation

## 2021-01-19 DIAGNOSIS — C3411 Malignant neoplasm of upper lobe, right bronchus or lung: Secondary | ICD-10-CM | POA: Insufficient documentation

## 2021-01-19 DIAGNOSIS — Z5111 Encounter for antineoplastic chemotherapy: Secondary | ICD-10-CM | POA: Insufficient documentation

## 2021-01-19 DIAGNOSIS — Z510 Encounter for antineoplastic radiation therapy: Secondary | ICD-10-CM | POA: Diagnosis not present

## 2021-01-19 DIAGNOSIS — C3491 Malignant neoplasm of unspecified part of right bronchus or lung: Secondary | ICD-10-CM

## 2021-01-19 DIAGNOSIS — Z79899 Other long term (current) drug therapy: Secondary | ICD-10-CM | POA: Insufficient documentation

## 2021-01-19 DIAGNOSIS — F17210 Nicotine dependence, cigarettes, uncomplicated: Secondary | ICD-10-CM | POA: Insufficient documentation

## 2021-01-19 NOTE — Telephone Encounter (Signed)
Called patient to give upcoming appointments for PET and MRI, patients mailbox is full, will mail a calendar to patient with instructions and location of those appointments

## 2021-01-20 ENCOUNTER — Encounter: Payer: Self-pay | Admitting: *Deleted

## 2021-01-20 ENCOUNTER — Other Ambulatory Visit: Payer: Self-pay | Admitting: *Deleted

## 2021-01-20 NOTE — Progress Notes (Signed)
Called Abigail Clark to inform her of upcoming Brain MRI and PET scan appointments.  Patient reports that she saw the PET scan appointment in [place] and will check for MRI appointment information.  I informed her that we mailed appointment information and instructions and to call if she has any questions.

## 2021-01-23 ENCOUNTER — Encounter: Payer: Self-pay | Admitting: *Deleted

## 2021-01-23 DIAGNOSIS — Z510 Encounter for antineoplastic radiation therapy: Secondary | ICD-10-CM | POA: Diagnosis not present

## 2021-01-23 NOTE — Progress Notes (Signed)
I followed up on Abigail Clark's plan of care. She is set up for scans and rad onc but did not see med onc schedule yet. I reached out to [facility] for an update, wait for response.

## 2021-01-26 ENCOUNTER — Other Ambulatory Visit: Payer: Self-pay

## 2021-01-26 ENCOUNTER — Encounter: Payer: Self-pay | Admitting: *Deleted

## 2021-01-26 ENCOUNTER — Ambulatory Visit
Admission: RE | Admit: 2021-01-26 | Discharge: 2021-01-26 | Disposition: A | Discharge status: Home / Self Care | Payer: 59 | Source: Ambulatory Visit | Attending: Radiation Oncology | Admitting: Radiation Oncology

## 2021-01-26 DIAGNOSIS — Z510 Encounter for antineoplastic radiation therapy: Secondary | ICD-10-CM | POA: Diagnosis not present

## 2021-01-26 DIAGNOSIS — C3491 Malignant neoplasm of unspecified part of right bronchus or lung: Secondary | ICD-10-CM

## 2021-01-26 NOTE — Progress Notes (Signed)
I followed up on Abigail Clark's schedule.  She has not been set up for her systemic therapy as of now. I reached out to scheduling team for an update.

## 2021-01-27 ENCOUNTER — Ambulatory Visit (HOSPITAL_COMMUNITY)
Admission: RE | Admit: 2021-01-27 | Discharge: 2021-01-27 | Disposition: A | Discharge status: Home / Self Care | Payer: 59 | Source: Ambulatory Visit | Attending: Internal Medicine | Admitting: Internal Medicine

## 2021-01-27 ENCOUNTER — Encounter: Payer: Self-pay | Admitting: *Deleted

## 2021-01-27 ENCOUNTER — Inpatient Hospital Stay: Payer: 59

## 2021-01-27 ENCOUNTER — Ambulatory Visit
Admission: RE | Admit: 2021-01-27 | Discharge: 2021-01-27 | Disposition: A | Discharge status: Home / Self Care | Payer: 59 | Source: Ambulatory Visit | Attending: Radiation Oncology | Admitting: Radiation Oncology

## 2021-01-27 DIAGNOSIS — C3490 Malignant neoplasm of unspecified part of unspecified bronchus or lung: Secondary | ICD-10-CM | POA: Diagnosis present

## 2021-01-27 DIAGNOSIS — C3491 Malignant neoplasm of unspecified part of right bronchus or lung: Secondary | ICD-10-CM

## 2021-01-27 MED ORDER — GADOBUTROL 1 MMOL/ML IV SOLN
9.0000 mL | Freq: Once | INTRAVENOUS | Status: AC | PRN
  Administered 2021-01-27: 9 mL via INTRAVENOUS

## 2021-01-27 MED ORDER — SONAFINE EX EMUL
1.0000 "application " | Freq: Once | CUTANEOUS | Status: AC
  Administered 2021-01-27: 1 via TOPICAL

## 2021-01-27 NOTE — Progress Notes (Signed)
Pt here for patient teaching.    Pt given Radiation and You booklet, skin care instructions, and Sonafine.    Reviewed areas of pertinence such as fatigue, hair loss, skin changes, throat changes, cough, shortness of breath, earaches, and taste changes .   Pt able to give teach back of to pat skin, use unscented/gentle soap, and use baby wipes,apply Sonafine bid and avoid applying anything to skin within 4 hours of treatment.   Pt verbalizes understanding of information given and will contact nursing with any questions or concerns.    [email or web address]

## 2021-01-27 NOTE — Progress Notes (Signed)
I met Abigail Clark today. She had radiation treatment.  I updated her on her schedule and printed for her. She is having her MRI brain and chemo education today.  Tomorrow, she will have labs, see Dr. Julien Nordmann, and then start chemo.  She again verbalized understanding of her treatment plan.

## 2021-01-27 NOTE — Progress Notes (Signed)
Pharmacist Chemotherapy Monitoring - Initial Assessment    Anticipated start date: 01/28/21   The following has been reviewed per standard work regarding the patient's treatment regimen: The patient's diagnosis, treatment plan and drug doses, and organ/hematologic function Lab orders and baseline tests specific to treatment regimen  The treatment plan start date, drug sequencing, and pre-medications Prior authorization status  Patient's documented medication list, including drug-drug interaction screen and prescriptions for anti-emetics and supportive care specific to the treatment regimen The drug concentrations, fluid compatibility, administration routes, and timing of the medications to be used The patient's access for treatment and lifetime cumulative dose history, if applicable  The patient's medication allergies and previous infusion related reactions, if applicable   Changes made to treatment plan:  treatment plan date  Follow up needed:  Pending authorization for treatment     Kennith Center, Pharm.D., CPP 01/27/2021@3 :45 PM

## 2021-01-28 ENCOUNTER — Other Ambulatory Visit: Payer: Self-pay | Admitting: Medical Oncology

## 2021-01-28 ENCOUNTER — Other Ambulatory Visit: Payer: Self-pay

## 2021-01-28 ENCOUNTER — Institutional Professional Consult (permissible substitution): Payer: 59 | Admitting: Internal Medicine

## 2021-01-28 ENCOUNTER — Ambulatory Visit
Admission: RE | Admit: 2021-01-28 | Discharge: 2021-01-28 | Disposition: A | Discharge status: Home / Self Care | Payer: 59 | Source: Ambulatory Visit | Attending: Radiation Oncology | Admitting: Radiation Oncology

## 2021-01-28 ENCOUNTER — Encounter: Payer: Self-pay | Admitting: Internal Medicine

## 2021-01-28 ENCOUNTER — Telehealth: Payer: Self-pay | Admitting: Internal Medicine

## 2021-01-28 ENCOUNTER — Inpatient Hospital Stay: Payer: 59

## 2021-01-28 ENCOUNTER — Inpatient Hospital Stay (HOSPITAL_BASED_OUTPATIENT_CLINIC_OR_DEPARTMENT_OTHER): Payer: 59 | Admitting: Internal Medicine

## 2021-01-28 VITALS — BP 102/85 | HR 85 | Temp 97.0°F | Resp 18 | Ht 67.0 in | Wt 196.7 lb

## 2021-01-28 VITALS — BP 114/72 | HR 70 | Resp 16

## 2021-01-28 DIAGNOSIS — C3491 Malignant neoplasm of unspecified part of right bronchus or lung: Secondary | ICD-10-CM | POA: Diagnosis not present

## 2021-01-28 DIAGNOSIS — Z5111 Encounter for antineoplastic chemotherapy: Secondary | ICD-10-CM

## 2021-01-28 DIAGNOSIS — Z510 Encounter for antineoplastic radiation therapy: Secondary | ICD-10-CM | POA: Diagnosis not present

## 2021-01-28 LAB — CBC WITH DIFFERENTIAL (CANCER CENTER ONLY)
Abs Immature Granulocytes: 0.03 10*3/uL (ref 0.00–0.07)
Basophils Absolute: 0 10*3/uL (ref 0.0–0.1)
Basophils Relative: 0 %
Eosinophils Absolute: 0.4 10*3/uL (ref 0.0–0.5)
Eosinophils Relative: 4 %
HCT: 36.2 % (ref 36.0–46.0)
Hemoglobin: 11.9 g/dL — ABNORMAL LOW (ref 12.0–15.0)
Immature Granulocytes: 0 %
Lymphocytes Relative: 13 %
Lymphs Abs: 1.5 10*3/uL (ref 0.7–4.0)
MCH: 28.5 pg (ref 26.0–34.0)
MCHC: 32.9 g/dL (ref 30.0–36.0)
MCV: 86.8 fL (ref 80.0–100.0)
Monocytes Absolute: 0.7 10*3/uL (ref 0.1–1.0)
Monocytes Relative: 6 %
Neutro Abs: 9.4 10*3/uL — ABNORMAL HIGH (ref 1.7–7.7)
Neutrophils Relative %: 77 %
Platelet Count: 398 10*3/uL (ref 150–400)
RBC: 4.17 MIL/uL (ref 3.87–5.11)
RDW: 13.4 % (ref 11.5–15.5)
WBC Count: 12.2 10*3/uL — ABNORMAL HIGH (ref 4.0–10.5)
nRBC: 0 % (ref 0.0–0.2)

## 2021-01-28 LAB — CMP (CANCER CENTER ONLY)
ALT: 11 U/L (ref 0–44)
AST: 12 U/L — ABNORMAL LOW (ref 15–41)
Albumin: 3.6 g/dL (ref 3.5–5.0)
Alkaline Phosphatase: 84 U/L (ref 38–126)
Anion gap: 10 (ref 5–15)
BUN: 10 mg/dL (ref 6–20)
CO2: 23 mmol/L (ref 22–32)
Calcium: 9.6 mg/dL (ref 8.9–10.3)
Chloride: 106 mmol/L (ref 98–111)
Creatinine: 0.77 mg/dL (ref 0.44–1.00)
GFR, Estimated: >60 mL/min (ref >60)
Glucose, Bld: 104 mg/dL — ABNORMAL HIGH (ref 70–99)
Potassium: 3.9 mmol/L (ref 3.5–5.1)
Sodium: 139 mmol/L (ref 135–145)
Total Bilirubin: 0.3 mg/dL (ref 0.3–1.2)
Total Protein: 8.1 g/dL (ref 6.5–8.1)

## 2021-01-28 LAB — PREGNANCY, URINE: Preg Test, Ur: NEGATIVE

## 2021-01-28 MED ORDER — SODIUM CHLORIDE 0.9 % IV SOLN: Freq: Once | INTRAVENOUS | Status: AC

## 2021-01-28 MED ORDER — SODIUM CHLORIDE 0.9 % IV SOLN
10.0000 mg | Freq: Once | INTRAVENOUS | Status: AC
  Administered 2021-01-28: 10 mg via INTRAVENOUS
  Filled 2021-01-28: qty 10

## 2021-01-28 MED ORDER — SODIUM CHLORIDE 0.9 % IV SOLN
45.0000 mg/m2 | Freq: Once | INTRAVENOUS | Status: AC
  Administered 2021-01-28: 90 mg via INTRAVENOUS
  Filled 2021-01-28: qty 15

## 2021-01-28 MED ORDER — SODIUM CHLORIDE 0.9 % IV SOLN
290.0000 mg | Freq: Once | INTRAVENOUS | Status: AC
  Administered 2021-01-28: 290 mg via INTRAVENOUS
  Filled 2021-01-28: qty 29

## 2021-01-28 MED ORDER — DIPHENHYDRAMINE HCL 50 MG/ML IJ SOLN
50.0000 mg | Freq: Once | INTRAMUSCULAR | Status: AC
  Administered 2021-01-28: 50 mg via INTRAVENOUS
  Filled 2021-01-28: qty 1

## 2021-01-28 MED ORDER — FAMOTIDINE 20 MG IN NS 100 ML IVPB
20.0000 mg | Freq: Once | INTRAVENOUS | Status: AC
  Administered 2021-01-28: 20 mg via INTRAVENOUS
  Filled 2021-01-28: qty 100

## 2021-01-28 MED ORDER — PALONOSETRON HCL INJECTION 0.25 MG/5ML
0.2500 mg | Freq: Once | INTRAVENOUS | Status: AC
  Administered 2021-01-28: 0.25 mg via INTRAVENOUS
  Filled 2021-01-28: qty 5

## 2021-01-28 NOTE — Telephone Encounter (Signed)
I called and spoke with pt and she stated that she had her radiation this morning and was supposed to do her chemo at 10 but didn't start that until 14.  She was exhausted when she left there today and just could not make her appt at 315 with [facility]>   next opening for consult is 9/21 but the pt would like to be seen prior to this appt date and time.  ND do you want me to see if I can work her in with another provider?  Please advise. Thanks

## 2021-01-28 NOTE — Progress Notes (Signed)
[facility] [phone]   [phone]  OFFICE PROGRESS NOTE  Koirala, Dibas, MD [address] [place] [address]  DIAGNOSIS: stage IIIb (T4, N2, M0) non-small cell lung cancer, adenocarcinoma presented with large right upper lobe lung mass with associated narrowing and occlusion of the right pulmonary artery superior trunk as well as narrowing of the interlobular right pulmonary artery and encasement and narrowing of the right upper lobe bronchus in addition to right hilar and subcarinal lymphadenopathy and suspicious lesion in the liver.  This was diagnosed in August 2022.  Molecular studies by [employer] showed no actionable mutations.  PRIOR THERAPY: None  CURRENT THERAPY: Concurrent chemoradiation with weekly carboplatin for AUC of 2 and paclitaxel 45 Mg/M2.  INTERVAL HISTORY: Abigail Clark 45 y.o. female returns to the clinic today for follow-up visit.  The patient is feeling fine today with no concerning complaints except for mild nausea earlier today.  She denied having any chest pain, shortness of breath, cough or hemoptysis.  She denied having any fever or chills.  She has no nausea, vomiting, diarrhea or constipation.  She has no headache or visual changes.  She had MRI of the brain yesterday that was negative for metastatic disease to the brain.  The patient had molecular studies by [employer] and the blood test showed no actionable mutations.  The tissue test is a still pending.  She is here today to start the first cycle of her concurrent chemotherapy.  MEDICAL HISTORY: Past Medical History:  Diagnosis Date   Cataract    RIGHT EYE   Eczema    Exotropia of right eye 11/2014   Irritable bowel syndrome (IBS)    no current med.   Lactose intolerance     ALLERGIES:  is allergic to penicillins.  MEDICATIONS:  Current Outpatient Medications  Medication Sig Dispense Refill   cholecalciferol (VITAMIN D3) 25  MCG (1000 UNIT) tablet Take 1,000 Units by mouth daily.     Multiple Vitamin (MULTIVITAMIN) tablet Take 1 tablet by mouth daily.     prochlorperazine (COMPAZINE) 10 MG tablet Take 1 tablet (10 mg total) by mouth every 6 (six) hours as needed for nausea or vomiting. 30 tablet 0   UNABLE TO FIND Sea moss, burdock root, macca,     vitamin C (ASCORBIC ACID) 250 MG tablet Take 250 mg by mouth daily.     vitamin E 1000 UNIT capsule Take by mouth daily.     doxycycline (DORYX) 100 MG EC tablet Take 100 mg by mouth 2 (two) times daily. "For my face" (Patient not taking: Reported on 01/28/2021)     tretinoin (RETIN-A) 0.025 % gel Apply topically.     No current facility-administered medications for this visit.    SURGICAL HISTORY:  Past Surgical History:  Procedure Laterality Date   BRONCHIAL BIOPSY  01/12/2021   Procedure: BRONCHIAL BIOPSIES;  Surgeon: Spero Geralds, MD;  Location: [facility] ENDOSCOPY;  Service: Pulmonary;;   BRONCHIAL NEEDLE ASPIRATION BIOPSY  01/12/2021   Procedure: BRONCHIAL NEEDLE ASPIRATION BIOPSIES;  Surgeon: Spero Geralds, MD;  Location: [facility] ENDOSCOPY;  Service: Pulmonary;;   CATARACT PEDIATRIC Right 1986   LEEP  05/20/2006   LIPOSUCTION  10/2014   abd. - local anes.   STRABISMUS SURGERY Right 11/29/2014   Procedure: REPAIR STRABISMUS RIGHT EYE ;  Surgeon: Everitt Amber, MD;  Location: [facility];  Service: Ophthalmology;  Laterality: Right;   VIDEO BRONCHOSCOPY  WITH ENDOBRONCHIAL ULTRASOUND N/A 01/12/2021   Procedure: VIDEO BRONCHOSCOPY WITH ENDOBRONCHIAL ULTRASOUND;  Surgeon: Spero Geralds, MD;  Location: [facility] ENDOSCOPY;  Service: Pulmonary;  Laterality: N/A;   WISDOM TOOTH EXTRACTION      REVIEW OF SYSTEMS:  Constitutional: positive for fatigue Eyes: negative Ears, nose, mouth, throat, and face: negative Respiratory: negative Cardiovascular: negative Gastrointestinal: positive for nausea Genitourinary:negative Integument/breast:  negative Hematologic/lymphatic: negative Musculoskeletal:negative Neurological: negative Behavioral/Psych: negative Endocrine: negative Allergic/Immunologic: negative   PHYSICAL EXAMINATION: General appearance: alert, cooperative, and no distress Head: Normocephalic, without obvious abnormality, atraumatic Neck: no adenopathy, no JVD, supple, symmetrical, trachea midline, and thyroid not enlarged, symmetric, no tenderness/mass/nodules Lymph nodes: Cervical, supraclavicular, and axillary nodes normal. Resp: clear to auscultation bilaterally Back: symmetric, no curvature. ROM normal. No CVA tenderness. Cardio: regular rate and rhythm, S1, S2 normal, no murmur, click, rub or gallop GI: soft, non-tender; bowel sounds normal; no masses,  no organomegaly Extremities: extremities normal, atraumatic, no cyanosis or edema Neurologic: Alert and oriented X 3, normal strength and tone. Normal symmetric reflexes. Normal coordination and gait  ECOG PERFORMANCE STATUS: 1 - Symptomatic but completely ambulatory  Blood pressure 102/85, pulse 85, temperature (!) 97 F (36.1 C), temperature source Tympanic, resp. rate 18, height 5\' 7"  (1.702 m), weight 196 lb 11.2 oz (89.2 kg), SpO2 100 %.  LABORATORY DATA: Lab Results  Component Value Date   WBC 12.2 (H) 01/28/2021   HGB 11.9 (L) 01/28/2021   HCT 36.2 01/28/2021   MCV 86.8 01/28/2021   PLT 398 01/28/2021      Chemistry      Component Value Date/Time   NA 139 01/15/2021 1226   K 3.9 01/15/2021 1226   CL 104 01/15/2021 1226   CO2 22 01/15/2021 1226   BUN 7 01/15/2021 1226   CREATININE 0.81 01/15/2021 1226      Component Value Date/Time   CALCIUM 10.4 (H) 01/15/2021 1226   ALKPHOS 92 01/15/2021 1226   AST 15 01/15/2021 1226   ALT 13 01/15/2021 1226   BILITOT 0.6 01/15/2021 1226       RADIOGRAPHIC STUDIES: DG Chest 2 View  Result Date: 01/11/2021 CLINICAL DATA:  Chest pain. Midsternal chest pain, fatigue and body aches. EXAM: CHEST  - 2 VIEW COMPARISON:  None. FINDINGS: There is a large 9.6 x 9.1 x 10.8 cm mass in the region of the perihilar right upper lobe. Mild background bronchial thickening. No other focal airspace disease. The heart is normal in size. No pneumothorax or pleural effusion. No acute osseous abnormalities are seen. IMPRESSION: Large 9.6 x 9.1 x 10.8 cm mass in the region of the perihilar right upper lobe. Recommend chest CT for characterization (with IV contrast in the absence of contraindication). Mild background bronchial thickening. Electronically Signed   By: Keith Rake M.D.   On: 01/11/2021 20:42   CT Chest Wo Contrast  Result Date: 01/11/2021 CLINICAL DATA:  Midsternal chest pain, fatigue, abnormal chest x-ray EXAM: CT CHEST WITHOUT CONTRAST TECHNIQUE: Multidetector CT imaging of the chest was performed following the standard protocol without IV contrast. COMPARISON:  None. FINDINGS: Cardiovascular: No significant coronary artery calcification. Global cardiac size within normal limits. No pericardial effusion. The central pulmonary arteries are of normal caliber. Aberrant right subclavian artery noted. The thoracic aorta is otherwise unremarkable. Mediastinum/Nodes: There is pathologic right paratracheal adenopathy and color fluid right hilar adenopathy with the central mass within the right upper lobe. Exact measurement is difficult given noncontrast technique. The esophagus is unremarkable. The thyroid is  unremarkable. Lungs/Pleura: There is apically predominant cystic lung disease which likely reflects changes of moderate centrilobular emphysema. There is a right upper lobe mass which is confluent with the right hilum and encases the right upper lobe pulmonary bronchus measuring 9.7 x 10.0 x 8.3 cm in greatest dimension on axial image # 49/4 and coronal image # 81/6 suspicious for a primary bronchogenic neoplasm. No pneumothorax or pleural effusion. The posterior segmental branch of the right upper lobe  appears obliterated by the mass. The airways are otherwise widely patent. Upper Abdomen: Several scattered hypodensities are noted within the visualized right hepatic lobe including a 2.1 cm nodule within the a posterior right hepatic lobe at axial image # 140/3. This is not well characterized on this examination, however, hepatic metastasis is not excluded. No acute abnormality identified. Musculoskeletal: No acute bone abnormality. No lytic or blastic bone lesion identified. IMPRESSION: 10.0 cm right upper lobe pulmonary mass obliterating the posterior segmental bronchus of the right upper lobe and encasing the right upper lobe pulmonary bronchus centrally suspicious for a primary bronchogenic malignancy. Associated confluent right hilar and right paratracheal adenopathy. Indeterminate lesion within the right hepatic lobe noted and hepatic metastasis is not excluded. CT examination of the chest, abdomen, and pelvis with contrast would be helpful for initial staging purposes. PET CT examination may also be helpful both for staging purposes as well as determination of a tissue sampling strategy. Cystic lung disease most in keeping with moderate centrilobular emphysema. Emphysema (ICD10-J43.9). Electronically Signed   By: Fidela Salisbury MD   On: 01/11/2021 22:27   MR BRAIN W WO CONTRAST  Result Date: 01/27/2021 CLINICAL DATA:  Staging non-small cell lung cancer EXAM: MRI HEAD WITHOUT AND WITH CONTRAST TECHNIQUE: Multiplanar, multiecho pulse sequences of the brain and surrounding structures were obtained without and with intravenous contrast. CONTRAST:  36mL GADAVIST GADOBUTROL 1 MMOL/ML IV SOLN COMPARISON:  No prior MRI of the head. FINDINGS: Brain: No acute infarction, hemorrhage, hydrocephalus, extra-axial collection or mass lesion. No abnormal enhancement. Vascular: Normal flow voids. Skull and upper cervical spine: Normal marrow signal. Sinuses/Orbits: Normal paranasal sinuses. Status post right lens  replacement. Other: The mastoid air cells are well aerated. IMPRESSION: No acute intracranial process. No evidence of metastatic disease in the brain. Electronically Signed   By: Merilyn Baba M.D.   On: 01/27/2021 16:02   CT CHEST ABDOMEN PELVIS W CONTRAST  Result Date: 01/12/2021 CLINICAL DATA:  Mediastinal mass. EXAM: CT CHEST, ABDOMEN, AND PELVIS WITH CONTRAST TECHNIQUE: Multidetector CT imaging of the chest, abdomen and pelvis was performed following the standard protocol during bolus administration of intravenous contrast. CONTRAST:  137mL OMNIPAQUE IOHEXOL 350 MG/ML SOLN COMPARISON:  None. FINDINGS: CT CHEST FINDINGS Cardiovascular: Normal heart size. No significant pericardial effusion. The thoracic aorta is normal in caliber. No atherosclerotic plaque of the thoracic aorta. No coronary artery calcifications. Mediastinum/Nodes: Slightly more hypodense soft tissue density along the right hilum could represent necrotic lymph nodes (3:31). Similar subcarinal finding (3:35). No enlarged left hilar or axillary lymph nodes. There is a 1.8 cm hypodense nodule within the right thyroid gland. Subcentimeter hypodense nodules within the left thyroid gland. The trachea and esophagus demonstrate no significant findings. Lungs/Pleura: At least moderate centrilobular emphysematous changes. Redemonstration of a right upper lobe, 10 x 9 x 8.5 cm, heterogeneous vascular peribronchovascular lesion. Associated marked narrowing/almost occlusion of right pulmonary artery superior trunk and narrowing of the interlobular right pulmonary artery. Associated encasement and marked narrowing of the right upper lobe bronchus due  to external mass effect. Distal to the mass there are right upper lobe centrilobular punctate pulmonary nodule. There is also a right upper lobe 1.1 cm nodule (4:46). Also noted is interlobular septal thickening at the right apex. No pleural effusion.  No pneumothorax. Musculoskeletal: No chest wall  abnormality. No suspicious lytic or blastic osseous lesions. No acute displaced fracture. CT ABDOMEN PELVIS FINDINGS Hepatobiliary: Redemonstration of several subcentimeter hypodensities within the hepatic parenchyma (3: 57, 58, 64, 74, 77). There is a 1.5 cm fluid density lesion within the right hepatic lobe that may represent a simple renal cyst. Hyperdensity within the gallbladder lumen likely represents a gallstone (3:77). No gallbladder wall thickening or pericholecystic fluid. No biliary dilatation. Pancreas: No focal lesion. Normal pancreatic contour. No surrounding inflammatory changes. No main pancreatic ductal dilatation. Spleen: Normal in size without focal abnormality. Adrenals/Urinary Tract: No adrenal nodule bilaterally. Bilateral kidneys enhance symmetrically. No hydronephrosis. No hydroureter. The urinary bladder is unremarkable. On delayed imaging, there is no urothelial wall thickening and there are no filling defects in the opacified portions of the bilateral collecting systems or ureters. Stomach/Bowel: Stomach is within normal limits. No evidence of bowel wall thickening or dilatation. Appendix appears normal. Vascular/Lymphatic: No abdominal aorta or iliac aneurysm. No abdominal, pelvic, or inguinal lymphadenopathy. Reproductive: Uterus and bilateral adnexa are unremarkable. Other: No intraperitoneal free fluid. No intraperitoneal free gas. No organized fluid collection. Musculoskeletal: No abdominal wall hernia or abnormality. Slight fatty atrophy of the left gluteal musculature compared to the right. No suspicious lytic or blastic osseous lesions. No acute displaced fracture. IMPRESSION: 1. Right upper lobe, 10 x 9 x 8.5 cm, heterogeneous vascular peribronchovascular lesion. Associated marked narrowing/almost occlusion of right pulmonary artery superior trunk and narrowing of the interlobular right pulmonary artery. Encasement and narrowing of the right upper lobe bronchus. Additional imaging  evaluation or consultation with Pulmonology or Thoracic Surgery recommended. 2. Likely necrotic right hilar and possibly subcarinal lymph nodes. 3. Distal to the mass there are right upper lobe scattered pulmonary nodules with postobstructive infection/inflammation not excluded. 4. Indeterminate left upper lobe pulmonary micronodule. 5. A 1.8 cm hypodense nodule within the right thyroid gland. Subcentimeter hypodense nodules within the left thyroid gland. Recommend thyroid US (ref: J Am Coll Radiol. 2015 Feb;12(2): 143-50). 6. Emphysema (ICD10-J43.9). 7. Multiple hypodensities within the hepatic parenchyma may represent hepatic cysts versus metastases. 8. Cholelithiasis with no CT evidence of acute cholecystitis or choledocholithiasis. Electronically Signed   By: Iven Finn M.D.   On: 01/12/2021 03:28    ASSESSMENT AND PLAN: This is a very pleasant 45 years old African-American female diagnosed with a stage IIIb (T4, N2, M0) non-small cell lung cancer, adenocarcinoma presented with large right upper lobe lung mass with narrowing and occlusion of the right pulmonary artery superior trunk as well as narrowing of the interlobular right pulmonary artery and encasement and narrowing of the right upper lobe bronchus in addition to right hilar and mediastinal lymphadenopathy and suspicious liver lesion. The patient had molecular studies by [employer] that showed no actionable mutation. She had MRI of the brain performed yesterday and that showed no evidence of metastatic disease to the brain. I discussed the MRI results as well as the molecular studies with the patient. I recommended for her to proceed with a course of concurrent chemoradiation with weekly carboplatin for AUC of 2 and paclitaxel 45 Mg/M2.  She will start the first dose of her chemotherapy today. I will see the patient back for follow-up visit in 2  weeks for evaluation before starting cycle #3. The patient was advised to call immediately if  she has any other concerning symptoms in the interval. The patient voices understanding of current disease status and treatment options and is in agreement with the current care plan.  All questions were answered. The patient knows to call the clinic with any problems, questions or concerns. We can certainly see the patient much sooner if necessary.  The total time spent in the appointment was 30 minutes.  Disclaimer: This note was dictated with voice recognition software. Similar sounding words can inadvertently be transcribed and may not be corrected upon review.

## 2021-01-28 NOTE — Patient Instructions (Signed)
[facility]   Discharge Instructions: Thank you for choosing [facility] to provide your oncology and hematology care.   If you have a lab appointment with the Cancer Center, please go directly to the Cancer Center and check in at the registration area.   Wear comfortable clothing and clothing appropriate for easy access to any Portacath or PICC line.   We strive to give you quality time with your provider. You may need to reschedule your appointment if you arrive late (15 or more minutes).  Arriving late affects you and other patients whose appointments are after yours.  Also, if you miss three or more appointments without notifying the office, you may be dismissed from the clinic at the provider's discretion.      For prescription refill requests, have your pharmacy contact our office and allow 72 hours for refills to be completed.    Today you received the following chemotherapy and/or immunotherapy agents: paclitaxel and carboplatin.      To help prevent nausea and vomiting after your treatment, we encourage you to take your nausea medication as directed.  BELOW ARE SYMPTOMS THAT SHOULD BE REPORTED IMMEDIATELY: *FEVER GREATER THAN 100.4 F (38 C) OR HIGHER *CHILLS OR SWEATING *NAUSEA AND VOMITING THAT IS NOT CONTROLLED WITH YOUR NAUSEA MEDICATION *UNUSUAL SHORTNESS OF BREATH *UNUSUAL BRUISING OR BLEEDING *URINARY PROBLEMS (pain or burning when urinating, or frequent urination) *BOWEL PROBLEMS (unusual diarrhea, constipation, pain near the anus) TENDERNESS IN MOUTH AND THROAT WITH OR WITHOUT PRESENCE OF ULCERS (sore throat, sores in mouth, or a toothache) UNUSUAL RASH, SWELLING OR PAIN  UNUSUAL VAGINAL DISCHARGE OR ITCHING   Items with * indicate a potential emergency and should be followed up as soon as possible or go to the Emergency Department if any problems should occur.  Please show the CHEMOTHERAPY ALERT CARD or IMMUNOTHERAPY ALERT  CARD at check-in to the Emergency Department and triage nurse.  Should you have questions after your visit or need to cancel or reschedule your appointment, please contact [facility]  Dept: [phone]  and follow the prompts.  Office hours are 8:00 a.m. to 4:30 p.m. Monday - Friday. Please note that voicemails left after 4:00 p.m. may not be returned until the following business day.  We are closed weekends and major holidays. You have access to a nurse at all times for urgent questions. Please call the main number to the clinic Dept: [phone] and follow the prompts.   For any non-urgent questions, you may also contact your provider using MyChart. We now offer e-Visits for anyone 18 and older to request care online for non-urgent symptoms. For details visit [email or web address].   Also download the MyChart app! Go to the app store, search "MyChart", open the app, select [facility], and log in with your MyChart username and password.  Due to Covid, a mask is required upon entering the hospital/clinic. If you do not have a mask, one will be given to you upon arrival. For doctor visits, patients may have 1 support person aged 18 or older with them. For treatment visits, patients cannot have anyone with them due to current Covid guidelines and our immunocompromised population.   Paclitaxel injection What is this medication? PACLITAXEL (PAK li TAX el) is a chemotherapy drug. It targets fast dividing cells, like cancer cells, and causes these cells to die. This medicine is used to treat ovarian cancer, breast cancer, lung cancer, Kaposi's sarcoma, and other cancers.   This medicine may be used for other purposes; ask your health care provider or pharmacist if you have questions. COMMON BRAND NAME(S): Onxol, Taxol What should I tell my care team before I take this medication? They need to know if you have any of these conditions: history of irregular heartbeat liver  disease low blood counts, like low white cell, platelet, or red cell counts lung or breathing disease, like asthma tingling of the fingers or toes, or other nerve disorder an unusual or allergic reaction to paclitaxel, alcohol, polyoxyethylated castor oil, other chemotherapy, other medicines, foods, dyes, or preservatives pregnant or trying to get pregnant breast-feeding How should I use this medication? This drug is given as an infusion into a vein. It is administered in a hospital or clinic by a specially trained health care professional. Talk to your pediatrician regarding the use of this medicine in children. Special care may be needed. Overdosage: If you think you have taken too much of this medicine contact a poison control center or emergency room at once. NOTE: This medicine is only for you. Do not share this medicine with others. What if I miss a dose? It is important not to miss your dose. Call your doctor or health care professional if you are unable to keep an appointment. What may interact with this medication? Do not take this medicine with any of the following medications: live virus vaccines This medicine may also interact with the following medications: antiviral medicines for hepatitis, HIV or AIDS certain antibiotics like erythromycin and clarithromycin certain medicines for fungal infections like ketoconazole and itraconazole certain medicines for seizures like carbamazepine, phenobarbital, phenytoin gemfibrozil nefazodone rifampin St. John's wort This list may not describe all possible interactions. Give your health care provider a list of all the medicines, herbs, non-prescription drugs, or dietary supplements you use. Also tell them if you smoke, drink alcohol, or use illegal drugs. Some items may interact with your medicine. What should I watch for while using this medication? Your condition will be monitored carefully while you are receiving this medicine. You  will need important blood work done while you are taking this medicine. This medicine can cause serious allergic reactions. To reduce your risk you will need to take other medicine(s) before treatment with this medicine. If you experience allergic reactions like skin rash, itching or hives, swelling of the face, lips, or tongue, tell your doctor or health care professional right away. In some cases, you may be given additional medicines to help with side effects. Follow all directions for their use. This drug may make you feel generally unwell. This is not uncommon, as chemotherapy can affect healthy cells as well as cancer cells. Report any side effects. Continue your course of treatment even though you feel ill unless your doctor tells you to stop. Call your doctor or health care professional for advice if you get a fever, chills or sore throat, or other symptoms of a cold or flu. Do not treat yourself. This drug decreases your body's ability to fight infections. Try to avoid being around people who are sick. This medicine may increase your risk to bruise or bleed. Call your doctor or health care professional if you notice any unusual bleeding. Be careful brushing and flossing your teeth or using a toothpick because you may get an infection or bleed more easily. If you have any dental work done, tell your dentist you are receiving this medicine. Avoid taking products that contain aspirin, acetaminophen, ibuprofen, naproxen, or ketoprofen unless   instructed by your doctor. These medicines may hide a fever. Do not become pregnant while taking this medicine. Women should inform their doctor if they wish to become pregnant or think they might be pregnant. There is a potential for serious side effects to an unborn child. Talk to your health care professional or pharmacist for more information. Do not breast-feed an infant while taking this medicine. Men are advised not to father a child while receiving this  medicine. This product may contain alcohol. Ask your pharmacist or healthcare provider if this medicine contains alcohol. Be sure to tell all healthcare providers you are taking this medicine. Certain medicines, like metronidazole and disulfiram, can cause an unpleasant reaction when taken with alcohol. The reaction includes flushing, headache, nausea, vomiting, sweating, and increased thirst. The reaction can last from 30 minutes to several hours. What side effects may I notice from receiving this medication? Side effects that you should report to your doctor or health care professional as soon as possible: allergic reactions like skin rash, itching or hives, swelling of the face, lips, or tongue breathing problems changes in vision fast, irregular heartbeat high or low blood pressure mouth sores pain, tingling, numbness in the hands or feet signs of decreased platelets or bleeding - bruising, pinpoint red spots on the skin, black, tarry stools, blood in the urine signs of decreased red blood cells - unusually weak or tired, feeling faint or lightheaded, falls signs of infection - fever or chills, cough, sore throat, pain or difficulty passing urine signs and symptoms of liver injury like dark yellow or brown urine; general ill feeling or flu-like symptoms; light-colored stools; loss of appetite; nausea; right upper belly pain; unusually weak or tired; yellowing of the eyes or skin swelling of the ankles, feet, hands unusually slow heartbeat Side effects that usually do not require medical attention (report to your doctor or health care professional if they continue or are bothersome): diarrhea hair loss loss of appetite muscle or joint pain nausea, vomiting pain, redness, or irritation at site where injected tiredness This list may not describe all possible side effects. Call your doctor for medical advice about side effects. You may report side effects to FDA at 1-800-FDA-1088. Where  should I keep my medication? This drug is given in a hospital or clinic and will not be stored at home. NOTE: This sheet is a summary. It may not cover all possible information. If you have questions about this medicine, talk to your doctor, pharmacist, or health care provider.  2022 Elsevier/Gold Standard (2019-05-02 13:37:23)  Carboplatin injection What is this medication? CARBOPLATIN (KAR boe pla tin) is a chemotherapy drug. It targets fast dividing cells, like cancer cells, and causes these cells to die. This medicine is used to treat ovarian cancer and many other cancers. This medicine may be used for other purposes; ask your health care provider or pharmacist if you have questions. COMMON BRAND NAME(S): Paraplatin What should I tell my care team before I take this medication? They need to know if you have any of these conditions: blood disorders hearing problems kidney disease recent or ongoing radiation therapy an unusual or allergic reaction to carboplatin, cisplatin, other chemotherapy, other medicines, foods, dyes, or preservatives pregnant or trying to get pregnant breast-feeding How should I use this medication? This drug is usually given as an infusion into a vein. It is administered in a hospital or clinic by a specially trained health care professional. Talk to your pediatrician regarding the use of this   medicine in children. Special care may be needed. Overdosage: If you think you have taken too much of this medicine contact a poison control center or emergency room at once. NOTE: This medicine is only for you. Do not share this medicine with others. What if I miss a dose? It is important not to miss a dose. Call your doctor or health care professional if you are unable to keep an appointment. What may interact with this medication? medicines for seizures medicines to increase blood counts like filgrastim, pegfilgrastim, sargramostim some antibiotics like amikacin,  gentamicin, neomycin, streptomycin, tobramycin vaccines Talk to your doctor or health care professional before taking any of these medicines: acetaminophen aspirin ibuprofen ketoprofen naproxen This list may not describe all possible interactions. Give your health care provider a list of all the medicines, herbs, non-prescription drugs, or dietary supplements you use. Also tell them if you smoke, drink alcohol, or use illegal drugs. Some items may interact with your medicine. What should I watch for while using this medication? Your condition will be monitored carefully while you are receiving this medicine. You will need important blood work done while you are taking this medicine. This drug may make you feel generally unwell. This is not uncommon, as chemotherapy can affect healthy cells as well as cancer cells. Report any side effects. Continue your course of treatment even though you feel ill unless your doctor tells you to stop. In some cases, you may be given additional medicines to help with side effects. Follow all directions for their use. Call your doctor or health care professional for advice if you get a fever, chills or sore throat, or other symptoms of a cold or flu. Do not treat yourself. This drug decreases your body's ability to fight infections. Try to avoid being around people who are sick. This medicine may increase your risk to bruise or bleed. Call your doctor or health care professional if you notice any unusual bleeding. Be careful brushing and flossing your teeth or using a toothpick because you may get an infection or bleed more easily. If you have any dental work done, tell your dentist you are receiving this medicine. Avoid taking products that contain aspirin, acetaminophen, ibuprofen, naproxen, or ketoprofen unless instructed by your doctor. These medicines may hide a fever. Do not become pregnant while taking this medicine. Women should inform their doctor if they wish  to become pregnant or think they might be pregnant. There is a potential for serious side effects to an unborn child. Talk to your health care professional or pharmacist for more information. Do not breast-feed an infant while taking this medicine. What side effects may I notice from receiving this medication? Side effects that you should report to your doctor or health care professional as soon as possible: allergic reactions like skin rash, itching or hives, swelling of the face, lips, or tongue signs of infection - fever or chills, cough, sore throat, pain or difficulty passing urine signs of decreased platelets or bleeding - bruising, pinpoint red spots on the skin, black, tarry stools, nosebleeds signs of decreased red blood cells - unusually weak or tired, fainting spells, lightheadedness breathing problems changes in hearing changes in vision chest pain high blood pressure low blood counts - This drug may decrease the number of white blood cells, red blood cells and platelets. You may be at increased risk for infections and bleeding. nausea and vomiting pain, swelling, redness or irritation at the injection site pain, tingling, numbness in the hands   or feet problems with balance, talking, walking trouble passing urine or change in the amount of urine Side effects that usually do not require medical attention (report to your doctor or health care professional if they continue or are bothersome): hair loss loss of appetite metallic taste in the mouth or changes in taste This list may not describe all possible side effects. Call your doctor for medical advice about side effects. You may report side effects to FDA at 1-800-FDA-1088. Where should I keep my medication? This drug is given in a hospital or clinic and will not be stored at home. NOTE: This sheet is a summary. It may not cover all possible information. If you have questions about this medicine, talk to your doctor, pharmacist,  or health care provider.  2022 Elsevier/Gold Standard (2007-09-05 14:38:05)   

## 2021-01-29 ENCOUNTER — Ambulatory Visit
Admission: RE | Admit: 2021-01-29 | Discharge: 2021-01-29 | Disposition: A | Discharge status: Home / Self Care | Payer: 59 | Source: Ambulatory Visit | Attending: Radiation Oncology | Admitting: Radiation Oncology

## 2021-01-29 DIAGNOSIS — Z510 Encounter for antineoplastic radiation therapy: Secondary | ICD-10-CM | POA: Diagnosis not present

## 2021-01-29 NOTE — Telephone Encounter (Signed)
Called patient but she did not answer. Left message for her to call us back.  

## 2021-01-30 ENCOUNTER — Ambulatory Visit
Admission: RE | Admit: 2021-01-30 | Discharge: 2021-01-30 | Disposition: A | Discharge status: Home / Self Care | Payer: 59 | Source: Ambulatory Visit | Attending: Radiation Oncology | Admitting: Radiation Oncology

## 2021-01-30 ENCOUNTER — Other Ambulatory Visit: Payer: Self-pay

## 2021-01-30 ENCOUNTER — Encounter: Payer: Self-pay | Admitting: *Deleted

## 2021-01-30 DIAGNOSIS — Z510 Encounter for antineoplastic radiation therapy: Secondary | ICD-10-CM | POA: Diagnosis not present

## 2021-01-30 NOTE — Progress Notes (Signed)
Oncology Nurse Navigator Documentation  Oncology Nurse Navigator Flowsheets 01/30/2021  Abnormal Finding Date 01/11/2021  Confirmed Diagnosis Date 01/12/2021  Diagnosis Status Confirmed Diagnosis Complete  Planned Course of Treatment Chemo/Radiation Concurrent  Phase of Treatment Radiation  Chemotherapy Actual Start Date: 01/28/2021  Radiation Actual Start Date: 01/26/2021  Navigator Follow Up Date: 02/02/2021  Navigator Follow Up Reason: Education  Navigator Location [facility]  Navigator Encounter Type Other:  Treatment Initiated Date 01/26/2021  Treatment Phase Treatment  Barriers/Navigation Needs Coordination of Care  Interventions Coordination of Care  Acuity Level 2-Minimal Needs (1-2 Barriers Identified)  Coordination of Care Other  Time Spent with Patient 30

## 2021-02-02 ENCOUNTER — Ambulatory Visit
Admission: RE | Admit: 2021-02-02 | Discharge: 2021-02-02 | Disposition: A | Discharge status: Home / Self Care | Payer: 59 | Source: Ambulatory Visit | Attending: Radiation Oncology | Admitting: Radiation Oncology

## 2021-02-02 ENCOUNTER — Ambulatory Visit (HOSPITAL_COMMUNITY)
Admission: RE | Admit: 2021-02-02 | Discharge: 2021-02-02 | Disposition: A | Discharge status: Home / Self Care | Payer: 59 | Source: Ambulatory Visit | Attending: Internal Medicine | Admitting: Internal Medicine

## 2021-02-02 ENCOUNTER — Other Ambulatory Visit: Payer: Self-pay

## 2021-02-02 DIAGNOSIS — C3490 Malignant neoplasm of unspecified part of unspecified bronchus or lung: Secondary | ICD-10-CM | POA: Insufficient documentation

## 2021-02-02 LAB — GLUCOSE, CAPILLARY: Glucose-Capillary: 115 mg/dL — ABNORMAL HIGH (ref 70–99)

## 2021-02-02 MED ORDER — FLUDEOXYGLUCOSE F - 18 (FDG) INJECTION
9.8000 | Freq: Once | INTRAVENOUS | Status: AC
  Administered 2021-02-02: 9.77 via INTRAVENOUS

## 2021-02-02 MED FILL — Dexamethasone Sodium Phosphate Inj 100 MG/10ML: INTRAMUSCULAR | Qty: 1 | Status: AC

## 2021-02-03 ENCOUNTER — Ambulatory Visit
Admission: RE | Admit: 2021-02-03 | Discharge: 2021-02-03 | Disposition: A | Discharge status: Home / Self Care | Payer: 59 | Source: Ambulatory Visit | Attending: Radiation Oncology | Admitting: Radiation Oncology

## 2021-02-03 ENCOUNTER — Other Ambulatory Visit: Payer: Self-pay | Admitting: Internal Medicine

## 2021-02-03 ENCOUNTER — Inpatient Hospital Stay: Payer: 59

## 2021-02-03 VITALS — BP 109/78 | HR 98 | Temp 98.4°F | Resp 18 | Wt 197.1 lb

## 2021-02-03 DIAGNOSIS — Z510 Encounter for antineoplastic radiation therapy: Secondary | ICD-10-CM | POA: Diagnosis not present

## 2021-02-03 DIAGNOSIS — C3491 Malignant neoplasm of unspecified part of right bronchus or lung: Secondary | ICD-10-CM

## 2021-02-03 DIAGNOSIS — Z5111 Encounter for antineoplastic chemotherapy: Secondary | ICD-10-CM

## 2021-02-03 LAB — GUARDANT 360

## 2021-02-03 LAB — CMP (CANCER CENTER ONLY)
ALT: 9 U/L (ref 0–44)
AST: 13 U/L — ABNORMAL LOW (ref 15–41)
Albumin: 3.6 g/dL (ref 3.5–5.0)
Alkaline Phosphatase: 83 U/L (ref 38–126)
Anion gap: 10 (ref 5–15)
BUN: 7 mg/dL (ref 6–20)
CO2: 25 mmol/L (ref 22–32)
Calcium: 10.1 mg/dL (ref 8.9–10.3)
Chloride: 105 mmol/L (ref 98–111)
Creatinine: 0.71 mg/dL (ref 0.44–1.00)
GFR, Estimated: >60 mL/min (ref >60)
Glucose, Bld: 86 mg/dL (ref 70–99)
Potassium: 4.1 mmol/L (ref 3.5–5.1)
Sodium: 140 mmol/L (ref 135–145)
Total Bilirubin: 0.4 mg/dL (ref 0.3–1.2)
Total Protein: 7.9 g/dL (ref 6.5–8.1)

## 2021-02-03 LAB — CBC WITH DIFFERENTIAL (CANCER CENTER ONLY)
Abs Immature Granulocytes: 0.05 10*3/uL (ref 0.00–0.07)
Basophils Absolute: 0 10*3/uL (ref 0.0–0.1)
Basophils Relative: 0 %
Eosinophils Absolute: 0.3 10*3/uL (ref 0.0–0.5)
Eosinophils Relative: 3 %
HCT: 34.9 % — ABNORMAL LOW (ref 36.0–46.0)
Hemoglobin: 11.7 g/dL — ABNORMAL LOW (ref 12.0–15.0)
Immature Granulocytes: 1 %
Lymphocytes Relative: 13 %
Lymphs Abs: 1.2 10*3/uL (ref 0.7–4.0)
MCH: 28.9 pg (ref 26.0–34.0)
MCHC: 33.5 g/dL (ref 30.0–36.0)
MCV: 86.2 fL (ref 80.0–100.0)
Monocytes Absolute: 0.5 10*3/uL (ref 0.1–1.0)
Monocytes Relative: 5 %
Neutro Abs: 7.1 10*3/uL (ref 1.7–7.7)
Neutrophils Relative %: 78 %
Platelet Count: 402 10*3/uL — ABNORMAL HIGH (ref 150–400)
RBC: 4.05 MIL/uL (ref 3.87–5.11)
RDW: 13.5 % (ref 11.5–15.5)
WBC Count: 9.1 10*3/uL (ref 4.0–10.5)
nRBC: 0 % (ref 0.0–0.2)

## 2021-02-03 LAB — PREGNANCY, URINE: Preg Test, Ur: NEGATIVE

## 2021-02-03 MED ORDER — SODIUM CHLORIDE 0.9 % IV SOLN: Freq: Once | INTRAVENOUS | Status: AC

## 2021-02-03 MED ORDER — DIPHENHYDRAMINE HCL 50 MG/ML IJ SOLN
50.0000 mg | Freq: Once | INTRAMUSCULAR | Status: AC
  Administered 2021-02-03: 50 mg via INTRAVENOUS
  Filled 2021-02-03: qty 1

## 2021-02-03 MED ORDER — SODIUM CHLORIDE 0.9 % IV SOLN
45.0000 mg/m2 | Freq: Once | INTRAVENOUS | Status: AC
  Administered 2021-02-03: 90 mg via INTRAVENOUS
  Filled 2021-02-03: qty 15

## 2021-02-03 MED ORDER — SODIUM CHLORIDE 0.9 % IV SOLN
10.0000 mg | Freq: Once | INTRAVENOUS | Status: AC
  Administered 2021-02-03: 10 mg via INTRAVENOUS
  Filled 2021-02-03: qty 10

## 2021-02-03 MED ORDER — PALONOSETRON HCL INJECTION 0.25 MG/5ML
0.2500 mg | Freq: Once | INTRAVENOUS | Status: AC
  Administered 2021-02-03: 0.25 mg via INTRAVENOUS
  Filled 2021-02-03: qty 5

## 2021-02-03 MED ORDER — SODIUM CHLORIDE 0.9 % IV SOLN
291.2000 mg | Freq: Once | INTRAVENOUS | Status: AC
  Administered 2021-02-03: 290 mg via INTRAVENOUS
  Filled 2021-02-03: qty 29

## 2021-02-03 MED ORDER — FAMOTIDINE 20 MG IN NS 100 ML IVPB
20.0000 mg | Freq: Once | INTRAVENOUS | Status: AC
  Administered 2021-02-03: 20 mg via INTRAVENOUS
  Filled 2021-02-03: qty 100

## 2021-02-03 NOTE — Patient Instructions (Signed)
[facility]  Discharge Instructions: Thank you for choosing [facility] to provide your oncology and hematology care.   If you have a lab appointment with the [facility], please go directly to the [facility] and check in at the registration area.   Wear comfortable clothing and clothing appropriate for easy access to any Portacath or PICC line.   We strive to give you quality time with your provider. You may need to reschedule your appointment if you arrive late (15 or more minutes).  Arriving late affects you and other patients whose appointments are after yours.  Also, if you miss three or more appointments without notifying the office, you may be dismissed from the clinic at the provider's discretion.      For prescription refill requests, have your pharmacy contact our office and allow 72 hours for refills to be completed.    Today you received the following chemotherapy and/or immunotherapy agents : Taxol, Carboplatin     To help prevent nausea and vomiting after your treatment, we encourage you to take your nausea medication as directed.  BELOW ARE SYMPTOMS THAT SHOULD BE REPORTED IMMEDIATELY: *FEVER GREATER THAN 100.4 F (38 C) OR HIGHER *CHILLS OR SWEATING *NAUSEA AND VOMITING THAT IS NOT CONTROLLED WITH YOUR NAUSEA MEDICATION *UNUSUAL SHORTNESS OF BREATH *UNUSUAL BRUISING OR BLEEDING *URINARY PROBLEMS (pain or burning when urinating, or frequent urination) *BOWEL PROBLEMS (unusual diarrhea, constipation, pain near the anus) TENDERNESS IN MOUTH AND THROAT WITH OR WITHOUT PRESENCE OF ULCERS (sore throat, sores in mouth, or a toothache) UNUSUAL RASH, SWELLING OR PAIN  UNUSUAL VAGINAL DISCHARGE OR ITCHING   Items with * indicate a potential emergency and should be followed up as soon as possible or go to the Emergency Department if any problems should occur.  Please show the CHEMOTHERAPY ALERT CARD or IMMUNOTHERAPY ALERT CARD at  check-in to the Emergency Department and triage nurse.  Should you have questions after your visit or need to cancel or reschedule your appointment, please contact [facility]  Dept: [phone]  and follow the prompts.  Office hours are 8:00 a.m. to 4:30 p.m. Monday - Friday. Please note that voicemails left after 4:00 p.m. may not be returned until the following business day.  We are closed weekends and major holidays. You have access to a nurse at all times for urgent questions. Please call the main number to the clinic Dept: [phone] and follow the prompts.   For any non-urgent questions, you may also contact your provider using MyChart. We now offer e-Visits for anyone 58 and older to request care online for non-urgent symptoms. For details visit [email or web address].   Also download the MyChart app! Go to the app store, search "MyChart", open the app, select [facility], and log in with your MyChart username and password.  Due to Covid, a mask is required upon entering the hospital/clinic. If you do not have a mask, one will be given to you upon arrival. For doctor visits, patients may have 1 support person aged 55 or older with them. For treatment visits, patients cannot have anyone with them due to current Covid guidelines and our immunocompromised population.

## 2021-02-04 ENCOUNTER — Other Ambulatory Visit: Payer: Self-pay

## 2021-02-04 ENCOUNTER — Encounter: Payer: Self-pay | Admitting: *Deleted

## 2021-02-04 ENCOUNTER — Ambulatory Visit
Admission: RE | Admit: 2021-02-04 | Discharge: 2021-02-04 | Disposition: A | Discharge status: Home / Self Care | Payer: 59 | Source: Ambulatory Visit | Attending: Radiation Oncology | Admitting: Radiation Oncology

## 2021-02-04 DIAGNOSIS — Z510 Encounter for antineoplastic radiation therapy: Secondary | ICD-10-CM | POA: Diagnosis not present

## 2021-02-04 NOTE — Progress Notes (Signed)
I followed up on Abigail Clark's schedule.  I did not see any infusions the week of 9/5.  I reached out to scheduling for an update.  Wait for responds.

## 2021-02-05 ENCOUNTER — Ambulatory Visit
Admission: RE | Admit: 2021-02-05 | Discharge: 2021-02-05 | Disposition: A | Discharge status: Home / Self Care | Payer: 59 | Source: Ambulatory Visit | Attending: Radiation Oncology | Admitting: Radiation Oncology

## 2021-02-05 ENCOUNTER — Encounter: Payer: Self-pay | Admitting: *Deleted

## 2021-02-05 DIAGNOSIS — Z510 Encounter for antineoplastic radiation therapy: Secondary | ICD-10-CM | POA: Diagnosis not present

## 2021-02-05 NOTE — Progress Notes (Signed)
I received an update from scheduling and they are working on getting Abigail Clark set up for infusion the week of 9/5.

## 2021-02-06 ENCOUNTER — Ambulatory Visit
Admission: RE | Admit: 2021-02-06 | Discharge: 2021-02-06 | Disposition: A | Discharge status: Home / Self Care | Payer: 59 | Source: Ambulatory Visit | Attending: Radiation Oncology | Admitting: Radiation Oncology

## 2021-02-06 ENCOUNTER — Other Ambulatory Visit: Payer: Self-pay

## 2021-02-06 DIAGNOSIS — Z510 Encounter for antineoplastic radiation therapy: Secondary | ICD-10-CM | POA: Diagnosis not present

## 2021-02-06 MED FILL — Dexamethasone Sodium Phosphate Inj 100 MG/10ML: INTRAMUSCULAR | Qty: 1 | Status: AC

## 2021-02-06 NOTE — Telephone Encounter (Signed)
Left message for patient to call back  

## 2021-02-09 ENCOUNTER — Inpatient Hospital Stay (HOSPITAL_BASED_OUTPATIENT_CLINIC_OR_DEPARTMENT_OTHER): Payer: 59 | Admitting: Internal Medicine

## 2021-02-09 ENCOUNTER — Encounter: Payer: Self-pay | Admitting: Internal Medicine

## 2021-02-09 ENCOUNTER — Ambulatory Visit
Admission: RE | Admit: 2021-02-09 | Discharge: 2021-02-09 | Disposition: A | Discharge status: Home / Self Care | Payer: 59 | Source: Ambulatory Visit | Attending: Radiation Oncology | Admitting: Radiation Oncology

## 2021-02-09 ENCOUNTER — Inpatient Hospital Stay: Payer: 59

## 2021-02-09 ENCOUNTER — Other Ambulatory Visit: Payer: Self-pay

## 2021-02-09 ENCOUNTER — Other Ambulatory Visit: Payer: Self-pay | Admitting: Radiation Oncology

## 2021-02-09 VITALS — BP 109/70 | HR 85 | Temp 96.0°F | Resp 19 | Ht 67.0 in | Wt 195.1 lb

## 2021-02-09 DIAGNOSIS — C3491 Malignant neoplasm of unspecified part of right bronchus or lung: Secondary | ICD-10-CM

## 2021-02-09 DIAGNOSIS — Z510 Encounter for antineoplastic radiation therapy: Secondary | ICD-10-CM | POA: Diagnosis not present

## 2021-02-09 DIAGNOSIS — Z5111 Encounter for antineoplastic chemotherapy: Secondary | ICD-10-CM

## 2021-02-09 LAB — CMP (CANCER CENTER ONLY)
ALT: 12 U/L (ref 0–44)
AST: 14 U/L — ABNORMAL LOW (ref 15–41)
Albumin: 3.8 g/dL (ref 3.5–5.0)
Alkaline Phosphatase: 87 U/L (ref 38–126)
Anion gap: 12 (ref 5–15)
BUN: 10 mg/dL (ref 6–20)
CO2: 20 mmol/L — ABNORMAL LOW (ref 22–32)
Calcium: 9.4 mg/dL (ref 8.9–10.3)
Chloride: 106 mmol/L (ref 98–111)
Creatinine: 0.74 mg/dL (ref 0.44–1.00)
GFR, Estimated: >60 mL/min (ref >60)
Glucose, Bld: 110 mg/dL — ABNORMAL HIGH (ref 70–99)
Potassium: 4 mmol/L (ref 3.5–5.1)
Sodium: 138 mmol/L (ref 135–145)
Total Bilirubin: 0.3 mg/dL (ref 0.3–1.2)
Total Protein: 7.7 g/dL (ref 6.5–8.1)

## 2021-02-09 LAB — CBC WITH DIFFERENTIAL (CANCER CENTER ONLY)
Abs Immature Granulocytes: 0.03 10*3/uL (ref 0.00–0.07)
Basophils Absolute: 0 10*3/uL (ref 0.0–0.1)
Basophils Relative: 1 %
Eosinophils Absolute: 0.2 10*3/uL (ref 0.0–0.5)
Eosinophils Relative: 2 %
HCT: 34.1 % — ABNORMAL LOW (ref 36.0–46.0)
Hemoglobin: 11.5 g/dL — ABNORMAL LOW (ref 12.0–15.0)
Immature Granulocytes: 0 %
Lymphocytes Relative: 9 %
Lymphs Abs: 0.7 10*3/uL (ref 0.7–4.0)
MCH: 29.1 pg (ref 26.0–34.0)
MCHC: 33.7 g/dL (ref 30.0–36.0)
MCV: 86.3 fL (ref 80.0–100.0)
Monocytes Absolute: 0.4 10*3/uL (ref 0.1–1.0)
Monocytes Relative: 6 %
Neutro Abs: 6 10*3/uL (ref 1.7–7.7)
Neutrophils Relative %: 82 %
Platelet Count: 328 10*3/uL (ref 150–400)
RBC: 3.95 MIL/uL (ref 3.87–5.11)
RDW: 13.8 % (ref 11.5–15.5)
WBC Count: 7.3 10*3/uL (ref 4.0–10.5)
nRBC: 0 % (ref 0.0–0.2)

## 2021-02-09 MED ORDER — SODIUM CHLORIDE 0.9 % IV SOLN
10.0000 mg | Freq: Once | INTRAVENOUS | Status: AC
  Administered 2021-02-09: 10 mg via INTRAVENOUS
  Filled 2021-02-09: qty 10

## 2021-02-09 MED ORDER — DIPHENHYDRAMINE HCL 50 MG/ML IJ SOLN
50.0000 mg | Freq: Once | INTRAMUSCULAR | Status: AC
  Administered 2021-02-09: 50 mg via INTRAVENOUS
  Filled 2021-02-09: qty 1

## 2021-02-09 MED ORDER — FAMOTIDINE 20 MG IN NS 100 ML IVPB
20.0000 mg | Freq: Once | INTRAVENOUS | Status: AC
  Administered 2021-02-09: 20 mg via INTRAVENOUS
  Filled 2021-02-09: qty 100

## 2021-02-09 MED ORDER — PALONOSETRON HCL INJECTION 0.25 MG/5ML
0.2500 mg | Freq: Once | INTRAVENOUS | Status: AC
  Administered 2021-02-09: 0.25 mg via INTRAVENOUS
  Filled 2021-02-09: qty 5

## 2021-02-09 MED ORDER — SODIUM CHLORIDE 0.9 % IV SOLN
45.0000 mg/m2 | Freq: Once | INTRAVENOUS | Status: AC
  Administered 2021-02-09: 90 mg via INTRAVENOUS
  Filled 2021-02-09: qty 15

## 2021-02-09 MED ORDER — SODIUM CHLORIDE 0.9 % IV SOLN
290.0000 mg | Freq: Once | INTRAVENOUS | Status: AC
  Administered 2021-02-09: 290 mg via INTRAVENOUS
  Filled 2021-02-09: qty 29

## 2021-02-09 MED ORDER — SUCRALFATE 1 G PO TABS: 1.0000 g | ORAL_TABLET | Freq: Three times a day (TID) | ORAL | 1 refills | Status: DC

## 2021-02-09 MED ORDER — SODIUM CHLORIDE 0.9 % IV SOLN: Freq: Once | INTRAVENOUS | Status: AC

## 2021-02-09 NOTE — Progress Notes (Signed)
My card with my contact information given to registration with information regarding [employer] [facility] on back.  Patient welcome to reach out at her earliest convenience to discuss.

## 2021-02-09 NOTE — Patient Instructions (Signed)
[facility]  Discharge Instructions: Thank you for choosing [facility] to provide your oncology and hematology care.   If you have a lab appointment with the Cancer Center, please go directly to the Cancer Center and check in at the registration area.   Wear comfortable clothing and clothing appropriate for easy access to any Portacath or PICC line.   We strive to give you quality time with your provider. You may need to reschedule your appointment if you arrive late (15 or more minutes).  Arriving late affects you and other patients whose appointments are after yours.  Also, if you miss three or more appointments without notifying the office, you may be dismissed from the clinic at the provider's discretion.      For prescription refill requests, have your pharmacy contact our office and allow 72 hours for refills to be completed.    Today you received the following chemotherapy and/or immunotherapy agents Taxol and carboplatin      To help prevent nausea and vomiting after your treatment, we encourage you to take your nausea medication as directed.  BELOW ARE SYMPTOMS THAT SHOULD BE REPORTED IMMEDIATELY: *FEVER GREATER THAN 100.4 F (38 C) OR HIGHER *CHILLS OR SWEATING *NAUSEA AND VOMITING THAT IS NOT CONTROLLED WITH YOUR NAUSEA MEDICATION *UNUSUAL SHORTNESS OF BREATH *UNUSUAL BRUISING OR BLEEDING *URINARY PROBLEMS (pain or burning when urinating, or frequent urination) *BOWEL PROBLEMS (unusual diarrhea, constipation, pain near the anus) TENDERNESS IN MOUTH AND THROAT WITH OR WITHOUT PRESENCE OF ULCERS (sore throat, sores in mouth, or a toothache) UNUSUAL RASH, SWELLING OR PAIN  UNUSUAL VAGINAL DISCHARGE OR ITCHING   Items with * indicate a potential emergency and should be followed up as soon as possible or go to the Emergency Department if any problems should occur.  Please show the CHEMOTHERAPY ALERT CARD or IMMUNOTHERAPY ALERT CARD at  check-in to the Emergency Department and triage nurse.  Should you have questions after your visit or need to cancel or reschedule your appointment, please contact [facility]  Dept: [phone]  and follow the prompts.  Office hours are 8:00 a.m. to 4:30 p.m. Monday - Friday. Please note that voicemails left after 4:00 p.m. may not be returned until the following business day.  We are closed weekends and major holidays. You have access to a nurse at all times for urgent questions. Please call the main number to the clinic Dept: [phone] and follow the prompts.   For any non-urgent questions, you may also contact your provider using MyChart. We now offer e-Visits for anyone 18 and older to request care online for non-urgent symptoms. For details visit [email or web address].   Also download the MyChart app! Go to the app store, search "MyChart", open the app, select [facility], and log in with your MyChart username and password.  Due to Covid, a mask is required upon entering the hospital/clinic. If you do not have a mask, one will be given to you upon arrival. For doctor visits, patients may have 1 support person aged 18 or older with them. For treatment visits, patients cannot have anyone with them due to current Covid guidelines and our immunocompromised population.   

## 2021-02-09 NOTE — Patient Instructions (Signed)
Steps to Quit Smoking Smoking tobacco is the leading cause of preventable death. It can affect almost every organ in the body. Smoking puts you and people around you at risk for many serious, long-lasting (chronic) diseases. Quitting smoking can be hard, but it is one of the best things that you can do for your health. It is never too late to quit. How do I get ready to quit? When you decide to quit smoking, make a plan to help you succeed. Before you quit: Pick a date to quit. Set a date within the next 2 weeks to give you time to prepare. Write down the reasons why you are quitting. Keep this list in places where you will see it often. Tell your family, friends, and co-workers that you are quitting. Their support is important. Talk with your doctor about the choices that may help you quit. Find out if your health insurance will pay for these treatments. Know the people, places, things, and activities that make you want to smoke (triggers). Avoid them. What first steps can I take to quit smoking? Throw away all cigarettes at home, at work, and in your car. Throw away the things that you use when you smoke, such as ashtrays and lighters. Clean your car. Make sure to empty the ashtray. Clean your home, including curtains and carpets. What can I do to help me quit smoking? Talk with your doctor about taking medicines and seeing a counselor at the same time. You are more likely to succeed when you do both. If you are pregnant or breastfeeding, talk with your doctor about counseling or other ways to quit smoking. Do not take medicine to help you quit smoking unless your doctor tells you to do so. To quit smoking: Quit right away Quit smoking totally, instead of slowly cutting back on how much you smoke over a period of time. Go to counseling. You are more likely to quit if you go to counseling sessions regularly. Take medicine You may take medicines to help you quit. Some medicines need a  prescription, and some you can buy over-the-counter. Some medicines may contain a drug called nicotine to replace the nicotine in cigarettes. Medicines may: Help you to stop having the desire to smoke (cravings). Help to stop the problems that come when you stop smoking (withdrawal symptoms). Your doctor may ask you to use: Nicotine patches, gum, or lozenges. Nicotine inhalers or sprays. Non-nicotine medicine that is taken by mouth. Find resources Find resources and other ways to help you quit smoking and remain smoke-free after you quit. These resources are most helpful when you use them often. They include: Online chats with a counselor. Phone quitlines. Printed self-help materials. Support groups or group counseling. Text messaging programs. Mobile phone apps. Use apps on your mobile phone or tablet that can help you stick to your quit plan. There are many free apps for mobile phones and tablets as well as websites. Examples include Quit Guide from the CDC and smokefree.gov  What things can I do to make it easier to quit?  Talk to your family and friends. Ask them to support and encourage you. Call a phone quitline (1-800-QUIT-NOW), reach out to support groups, or work with a counselor. Ask people who smoke to not smoke around you. Avoid places that make you want to smoke, such as: Bars. Parties. Smoke-break areas at work. Spend time with people who do not smoke. Lower the stress in your life. Stress can make you want to   smoke. Try these things to help your stress: Getting regular exercise. Doing deep-breathing exercises. Doing yoga. Meditating. Doing a body scan. To do this, close your eyes, focus on one area of your body at a time from head to toe. Notice which parts of your body are tense. Try to relax the muscles in those areas. How will I feel when I quit smoking? Day 1 to 3 weeks Within the first 24 hours, you may start to have some problems that come from quitting tobacco.  These problems are very bad 2-3 days after you quit, but they do not often last for more than 2-3 weeks. You may get these symptoms: Mood swings. Feeling restless, nervous, angry, or annoyed. Trouble concentrating. Dizziness. Strong desire for high-sugar foods and nicotine. Weight gain. Trouble pooping (constipation). Feeling like you may vomit (nausea). Coughing or a sore throat. Changes in how the medicines that you take for other issues work in your body. Depression. Trouble sleeping (insomnia). Week 3 and afterward After the first 2-3 weeks of quitting, you may start to notice more positive results, such as: Better sense of smell and taste. Less coughing and sore throat. Slower heart rate. Lower blood pressure. Clearer skin. Better breathing. Fewer sick days. Quitting smoking can be hard. Do not give up if you fail the first time. Some people need to try a few times before they succeed. Do your best to stick to your quit plan, and talk with your doctor if you have any questions or concerns. Summary Smoking tobacco is the leading cause of preventable death. Quitting smoking can be hard, but it is one of the best things that you can do for your health. When you decide to quit smoking, make a plan to help you succeed. Quit smoking right away, not slowly over a period of time. When you start quitting, seek help from your doctor, family, or friends. This information is not intended to replace advice given to you by your health care provider. Make sure you discuss any questions you have with your health care provider. Document Revised: 02/23/2019 Document Reviewed: 08/19/2018 Elsevier Patient Education  2022 [employer]  

## 2021-02-09 NOTE — Progress Notes (Signed)
[facility] [phone]   [phone]  OFFICE PROGRESS NOTE  Koirala, Dibas, MD [address] [place] [address]  DIAGNOSIS: stage IIIC (T4, N3, M0) non-small cell lung cancer, adenocarcinoma presented with large right upper lobe lung mass with associated narrowing and occlusion of the right pulmonary artery superior trunk as well as narrowing of the interlobular right pulmonary artery and encasement and narrowing of the right upper lobe bronchus in addition to right hilar and subcarinal lymphadenopathy and suspicious lesion in the liver.  This was diagnosed in August 2022.  Molecular studies by [employer] showed no actionable mutations.  PRIOR THERAPY: None  CURRENT THERAPY: Concurrent chemoradiation with weekly carboplatin for AUC of 2 and paclitaxel 45 Mg/M2.  Status post 2 cycles.   INTERVAL HISTORY: Abigail Clark 45 y.o. female returns to the clinic today for follow-up visit.  The patient is feeling fine today with no concerning complaints.  She tolerated the first 2 weeks of her systemic chemotherapy with radiation fairly well.  She had mild sore throat.  She denied having any current chest pain, shortness of breath, cough or hemoptysis.  She denied having any fever or chills.  She has no nausea, vomiting, diarrhea or constipation.  She has no headache or visual changes.  She had a PET scan performed last week and she is here for evaluation and discussion of her risk her results and treatment options.   MEDICAL HISTORY: Past Medical History:  Diagnosis Date   Cataract    RIGHT EYE   Eczema    Exotropia of right eye 11/2014   Irritable bowel syndrome (IBS)    no current med.   Lactose intolerance     ALLERGIES:  is allergic to penicillins.  MEDICATIONS:  Current Outpatient Medications  Medication Sig Dispense Refill   cholecalciferol (VITAMIN D3) 25 MCG (1000 UNIT) tablet Take 1,000 Units by mouth daily.      doxycycline (DORYX) 100 MG EC tablet Take 100 mg by mouth 2 (two) times daily. "For my face" (Patient not taking: Reported on 01/28/2021)     Multiple Vitamin (MULTIVITAMIN) tablet Take 1 tablet by mouth daily.     prochlorperazine (COMPAZINE) 10 MG tablet Take 1 tablet (10 mg total) by mouth every 6 (six) hours as needed for nausea or vomiting. 30 tablet 0   tretinoin (RETIN-A) 0.025 % gel Apply topically.     UNABLE TO FIND Sea moss, burdock root, macca,     vitamin C (ASCORBIC ACID) 250 MG tablet Take 250 mg by mouth daily.     vitamin E 1000 UNIT capsule Take by mouth daily.     No current facility-administered medications for this visit.    SURGICAL HISTORY:  Past Surgical History:  Procedure Laterality Date   BRONCHIAL BIOPSY  01/12/2021   Procedure: BRONCHIAL BIOPSIES;  Surgeon: Spero Geralds, MD;  Location: [facility] ENDOSCOPY;  Service: Pulmonary;;   BRONCHIAL NEEDLE ASPIRATION BIOPSY  01/12/2021   Procedure: BRONCHIAL NEEDLE ASPIRATION BIOPSIES;  Surgeon: Spero Geralds, MD;  Location: [facility] ENDOSCOPY;  Service: Pulmonary;;   CATARACT PEDIATRIC Right 1986   LEEP  05/20/2006   LIPOSUCTION  10/2014   abd. - local anes.   STRABISMUS SURGERY Right 11/29/2014   Procedure: REPAIR STRABISMUS RIGHT EYE ;  Surgeon: Everitt Amber, MD;  Location: [facility];  Service: Ophthalmology;  Laterality: Right;   VIDEO BRONCHOSCOPY WITH ENDOBRONCHIAL ULTRASOUND N/A 01/12/2021   Procedure:  VIDEO BRONCHOSCOPY WITH ENDOBRONCHIAL ULTRASOUND;  Surgeon: Spero Geralds, MD;  Location: [facility] ENDOSCOPY;  Service: Pulmonary;  Laterality: N/A;   WISDOM TOOTH EXTRACTION      REVIEW OF SYSTEMS:  A comprehensive review of systems was negative except for: Constitutional: positive for fatigue   PHYSICAL EXAMINATION: General appearance: alert, cooperative, fatigued, and no distress Head: Normocephalic, without obvious abnormality, atraumatic Neck: no adenopathy, no JVD, supple, symmetrical, trachea midline, and  thyroid not enlarged, symmetric, no tenderness/mass/nodules Lymph nodes: Cervical, supraclavicular, and axillary nodes normal. Resp: clear to auscultation bilaterally Back: symmetric, no curvature. ROM normal. No CVA tenderness. Cardio: regular rate and rhythm, S1, S2 normal, no murmur, click, rub or gallop GI: soft, non-tender; bowel sounds normal; no masses,  no organomegaly Extremities: extremities normal, atraumatic, no cyanosis or edema  ECOG PERFORMANCE STATUS: 1 - Symptomatic but completely ambulatory  Blood pressure 109/70, pulse 85, temperature (!) 96 F (35.6 C), temperature source Tympanic, resp. rate 19, height 5\' 7"  (1.702 m), weight 195 lb 1.6 oz (88.5 kg), SpO2 100 %.  LABORATORY DATA: Lab Results  Component Value Date   WBC 7.3 02/09/2021   HGB 11.5 (L) 02/09/2021   HCT 34.1 (L) 02/09/2021   MCV 86.3 02/09/2021   PLT 328 02/09/2021      Chemistry      Component Value Date/Time   NA 138 02/09/2021 0813   K 4.0 02/09/2021 0813   CL 106 02/09/2021 0813   CO2 20 (L) 02/09/2021 0813   BUN 10 02/09/2021 0813   CREATININE 0.74 02/09/2021 0813      Component Value Date/Time   CALCIUM 9.4 02/09/2021 0813   ALKPHOS 87 02/09/2021 0813   AST 14 (L) 02/09/2021 0813   ALT 12 02/09/2021 0813   BILITOT 0.3 02/09/2021 0813       RADIOGRAPHIC STUDIES: DG Chest 2 View  Result Date: 01/11/2021 CLINICAL DATA:  Chest pain. Midsternal chest pain, fatigue and body aches. EXAM: CHEST - 2 VIEW COMPARISON:  None. FINDINGS: There is a large 9.6 x 9.1 x 10.8 cm mass in the region of the perihilar right upper lobe. Mild background bronchial thickening. No other focal airspace disease. The heart is normal in size. No pneumothorax or pleural effusion. No acute osseous abnormalities are seen. IMPRESSION: Large 9.6 x 9.1 x 10.8 cm mass in the region of the perihilar right upper lobe. Recommend chest CT for characterization (with IV contrast in the absence of contraindication). Mild  background bronchial thickening. Electronically Signed   By: Keith Rake M.D.   On: 01/11/2021 20:42   CT Chest Wo Contrast  Result Date: 01/11/2021 CLINICAL DATA:  Midsternal chest pain, fatigue, abnormal chest x-ray EXAM: CT CHEST WITHOUT CONTRAST TECHNIQUE: Multidetector CT imaging of the chest was performed following the standard protocol without IV contrast. COMPARISON:  None. FINDINGS: Cardiovascular: No significant coronary artery calcification. Global cardiac size within normal limits. No pericardial effusion. The central pulmonary arteries are of normal caliber. Aberrant right subclavian artery noted. The thoracic aorta is otherwise unremarkable. Mediastinum/Nodes: There is pathologic right paratracheal adenopathy and color fluid right hilar adenopathy with the central mass within the right upper lobe. Exact measurement is difficult given noncontrast technique. The esophagus is unremarkable. The thyroid is unremarkable. Lungs/Pleura: There is apically predominant cystic lung disease which likely reflects changes of moderate centrilobular emphysema. There is a right upper lobe mass which is confluent with the right hilum and encases the right upper lobe pulmonary bronchus measuring 9.7 x 10.0 x 8.3  cm in greatest dimension on axial image # 49/4 and coronal image # 81/6 suspicious for a primary bronchogenic neoplasm. No pneumothorax or pleural effusion. The posterior segmental branch of the right upper lobe appears obliterated by the mass. The airways are otherwise widely patent. Upper Abdomen: Several scattered hypodensities are noted within the visualized right hepatic lobe including a 2.1 cm nodule within the a posterior right hepatic lobe at axial image # 140/3. This is not well characterized on this examination, however, hepatic metastasis is not excluded. No acute abnormality identified. Musculoskeletal: No acute bone abnormality. No lytic or blastic bone lesion identified. IMPRESSION: 10.0 cm  right upper lobe pulmonary mass obliterating the posterior segmental bronchus of the right upper lobe and encasing the right upper lobe pulmonary bronchus centrally suspicious for a primary bronchogenic malignancy. Associated confluent right hilar and right paratracheal adenopathy. Indeterminate lesion within the right hepatic lobe noted and hepatic metastasis is not excluded. CT examination of the chest, abdomen, and pelvis with contrast would be helpful for initial staging purposes. PET CT examination may also be helpful both for staging purposes as well as determination of a tissue sampling strategy. Cystic lung disease most in keeping with moderate centrilobular emphysema. Emphysema (ICD10-J43.9). Electronically Signed   By: Fidela Salisbury MD   On: 01/11/2021 22:27   MR BRAIN W WO CONTRAST  Result Date: 01/27/2021 CLINICAL DATA:  Staging non-small cell lung cancer EXAM: MRI HEAD WITHOUT AND WITH CONTRAST TECHNIQUE: Multiplanar, multiecho pulse sequences of the brain and surrounding structures were obtained without and with intravenous contrast. CONTRAST:  36mL GADAVIST GADOBUTROL 1 MMOL/ML IV SOLN COMPARISON:  No prior MRI of the head. FINDINGS: Brain: No acute infarction, hemorrhage, hydrocephalus, extra-axial collection or mass lesion. No abnormal enhancement. Vascular: Normal flow voids. Skull and upper cervical spine: Normal marrow signal. Sinuses/Orbits: Normal paranasal sinuses. Status post right lens replacement. Other: The mastoid air cells are well aerated. IMPRESSION: No acute intracranial process. No evidence of metastatic disease in the brain. Electronically Signed   By: Merilyn Baba M.D.   On: 01/27/2021 16:02   CT CHEST ABDOMEN PELVIS W CONTRAST  Result Date: 01/12/2021 CLINICAL DATA:  Mediastinal mass. EXAM: CT CHEST, ABDOMEN, AND PELVIS WITH CONTRAST TECHNIQUE: Multidetector CT imaging of the chest, abdomen and pelvis was performed following the standard protocol during bolus  administration of intravenous contrast. CONTRAST:  14mL OMNIPAQUE IOHEXOL 350 MG/ML SOLN COMPARISON:  None. FINDINGS: CT CHEST FINDINGS Cardiovascular: Normal heart size. No significant pericardial effusion. The thoracic aorta is normal in caliber. No atherosclerotic plaque of the thoracic aorta. No coronary artery calcifications. Mediastinum/Nodes: Slightly more hypodense soft tissue density along the right hilum could represent necrotic lymph nodes (3:31). Similar subcarinal finding (3:35). No enlarged left hilar or axillary lymph nodes. There is a 1.8 cm hypodense nodule within the right thyroid gland. Subcentimeter hypodense nodules within the left thyroid gland. The trachea and esophagus demonstrate no significant findings. Lungs/Pleura: At least moderate centrilobular emphysematous changes. Redemonstration of a right upper lobe, 10 x 9 x 8.5 cm, heterogeneous vascular peribronchovascular lesion. Associated marked narrowing/almost occlusion of right pulmonary artery superior trunk and narrowing of the interlobular right pulmonary artery. Associated encasement and marked narrowing of the right upper lobe bronchus due to external mass effect. Distal to the mass there are right upper lobe centrilobular punctate pulmonary nodule. There is also a right upper lobe 1.1 cm nodule (4:46). Also noted is interlobular septal thickening at the right apex. No pleural effusion.  No pneumothorax. Musculoskeletal:  No chest wall abnormality. No suspicious lytic or blastic osseous lesions. No acute displaced fracture. CT ABDOMEN PELVIS FINDINGS Hepatobiliary: Redemonstration of several subcentimeter hypodensities within the hepatic parenchyma (3: 57, 58, 64, 74, 77). There is a 1.5 cm fluid density lesion within the right hepatic lobe that may represent a simple renal cyst. Hyperdensity within the gallbladder lumen likely represents a gallstone (3:77). No gallbladder wall thickening or pericholecystic fluid. No biliary  dilatation. Pancreas: No focal lesion. Normal pancreatic contour. No surrounding inflammatory changes. No main pancreatic ductal dilatation. Spleen: Normal in size without focal abnormality. Adrenals/Urinary Tract: No adrenal nodule bilaterally. Bilateral kidneys enhance symmetrically. No hydronephrosis. No hydroureter. The urinary bladder is unremarkable. On delayed imaging, there is no urothelial wall thickening and there are no filling defects in the opacified portions of the bilateral collecting systems or ureters. Stomach/Bowel: Stomach is within normal limits. No evidence of bowel wall thickening or dilatation. Appendix appears normal. Vascular/Lymphatic: No abdominal aorta or iliac aneurysm. No abdominal, pelvic, or inguinal lymphadenopathy. Reproductive: Uterus and bilateral adnexa are unremarkable. Other: No intraperitoneal free fluid. No intraperitoneal free gas. No organized fluid collection. Musculoskeletal: No abdominal wall hernia or abnormality. Slight fatty atrophy of the left gluteal musculature compared to the right. No suspicious lytic or blastic osseous lesions. No acute displaced fracture. IMPRESSION: 1. Right upper lobe, 10 x 9 x 8.5 cm, heterogeneous vascular peribronchovascular lesion. Associated marked narrowing/almost occlusion of right pulmonary artery superior trunk and narrowing of the interlobular right pulmonary artery. Encasement and narrowing of the right upper lobe bronchus. Additional imaging evaluation or consultation with Pulmonology or Thoracic Surgery recommended. 2. Likely necrotic right hilar and possibly subcarinal lymph nodes. 3. Distal to the mass there are right upper lobe scattered pulmonary nodules with postobstructive infection/inflammation not excluded. 4. Indeterminate left upper lobe pulmonary micronodule. 5. A 1.8 cm hypodense nodule within the right thyroid gland. Subcentimeter hypodense nodules within the left thyroid gland. Recommend thyroid US (ref: J Am Coll  Radiol. 2015 Feb;12(2): 143-50). 6. Emphysema (ICD10-J43.9). 7. Multiple hypodensities within the hepatic parenchyma may represent hepatic cysts versus metastases. 8. Cholelithiasis with no CT evidence of acute cholecystitis or choledocholithiasis. Electronically Signed   By: Iven Finn M.D.   On: 01/12/2021 03:28   NM PET Image Initial (PI) Skull Base To Thigh  Result Date: 02/02/2021 CLINICAL DATA:  Initial treatment strategy for lung cancer. EXAM: NUCLEAR MEDICINE PET SKULL BASE TO THIGH TECHNIQUE: 9.77 mCi F-18 FDG was injected intravenously. Full-ring PET imaging was performed from the skull base to thigh after the radiotracer. CT data was obtained and used for attenuation correction and anatomic localization. Fasting blood glucose: 115 mg/dl COMPARISON:  CT of the chest abdomen and pelvis from 01/12/2021. FINDINGS: Mediastinal blood pool activity: SUV max 2.46 Liver activity: SUV max NA NECK: No hypermetabolic lymph nodes in the neck. Incidental CT findings: none CHEST: FDG avid central and lateral left supraclavicular lymph nodes identified, including: 1.8 cm lateral left supraclavicular lymph node with SUV max of 12.3, image 35/4. Central left supraclavicular lymph node measures 0.9 cm with SUV max of 8.04. FDG avid right paratracheal lymph nodes are noted, including: -high right paratracheal node measuring 1.6 cm with SUV max of 14.7, image 46/4. Low right paratracheal lymph node measures 1.4 cm within SUV max of 12.0, image 60/4. Subcarinal lymph node measures 1.3 cm with SUV max of 16.3, image 65/4. Necrotic right paratracheal node without significant FDG uptake measures 1.8 cm, image 61/4. Large partially necrotic right upper lobe  lung mass measures 10.1 x 8.8 cm with SUV max of 17.07. This extends to the right lateral chest wall with loss of fat plane. Cannot rule out chest wall invasion. There is surrounding mild interlobular septal thickening within the anterior right upper lobe which may  reflect lymphangitic spread of tumor. Satellite nodule within the medial right upper lobe measures 1 cm no FDG avid nodule or mass noted within the left lung. Incidental CT findings: Centrilobular and paraseptal emphysema. No pericardial effusion. ABDOMEN/PELVIS: No abnormal hypermetabolic activity within the liver, pancreas, adrenal glands, or spleen. No hypermetabolic lymph nodes in the abdomen or pelvis. Incidental CT findings: Posterior right lobe of liver cyst measures 1.4 cm. Gallstone noted. SKELETON: No focal hypermetabolic activity to suggest skeletal metastasis. Incidental CT findings: none IMPRESSION: 1. Right upper lobe lung mass the is intensely hypermetabolic compatible with primary bronchogenic carcinoma. Assuming non-small cell histology imaging findings are compatible with at least T4N3M0 or stage IIIc diease. 2. FDG avid right paratracheal, subcarinal, and left supraclavicular nodal metastasis. 3.  Emphysema (ICD10-J43.9). Electronically Signed   By: Kerby Moors M.D.   On: 02/02/2021 21:32    ASSESSMENT AND PLAN: This is a very pleasant 45 years old African-American female diagnosed with a stage IIIC (T4, N3, M0) non-small cell lung cancer, adenocarcinoma presented with large right upper lobe lung mass with narrowing and occlusion of the right pulmonary artery superior trunk as well as narrowing of the interlobular right pulmonary artery and encasement and narrowing of the right upper lobe bronchus in addition to right hilar and mediastinal lymphadenopathy and suspicious liver lesion. The patient had molecular studies by [employer] that showed no actionable mutation. She had a PET scan performed last week and that showed no evidence for metastatic disease outside the chest but there was evidence of metastatic disease to the left supraclavicular area. The patient is currently undergoing a course of concurrent chemoradiation with weekly carboplatin for AUC of 2 and paclitaxel 45 Mg/M2.   Status post 2 cycles. She has been tolerating her treatment fairly well with no concerning adverse effect except for mild fatigue and sore throat. I recommended for her to proceed with cycle #3 today as planned. She will come back for follow-up visit in 2 weeks for evaluation before starting cycle #5.  She is expected to have cycle #4 next week. The patient was advised to call immediately if she has any other concerning symptoms in the interval. The patient voices understanding of current disease status and treatment options and is in agreement with the current care plan.  All questions were answered. The patient knows to call the clinic with any problems, questions or concerns. We can certainly see the patient much sooner if necessary.   Disclaimer: This note was dictated with voice recognition software. Similar sounding words can inadvertently be transcribed and may not be corrected upon review.

## 2021-02-10 ENCOUNTER — Ambulatory Visit
Admission: RE | Admit: 2021-02-10 | Discharge: 2021-02-10 | Disposition: A | Discharge status: Home / Self Care | Payer: 59 | Source: Ambulatory Visit | Attending: Radiation Oncology | Admitting: Radiation Oncology

## 2021-02-10 DIAGNOSIS — Z510 Encounter for antineoplastic radiation therapy: Secondary | ICD-10-CM | POA: Diagnosis not present

## 2021-02-11 ENCOUNTER — Other Ambulatory Visit: Payer: Self-pay

## 2021-02-11 ENCOUNTER — Ambulatory Visit
Admission: RE | Admit: 2021-02-11 | Discharge: 2021-02-11 | Disposition: A | Discharge status: Home / Self Care | Payer: 59 | Source: Ambulatory Visit | Attending: Radiation Oncology | Admitting: Radiation Oncology

## 2021-02-11 DIAGNOSIS — Z510 Encounter for antineoplastic radiation therapy: Secondary | ICD-10-CM | POA: Diagnosis not present

## 2021-02-12 ENCOUNTER — Ambulatory Visit
Admission: RE | Admit: 2021-02-12 | Discharge: 2021-02-12 | Disposition: A | Discharge status: Home / Self Care | Payer: 59 | Source: Ambulatory Visit | Attending: Radiation Oncology | Admitting: Radiation Oncology

## 2021-02-12 ENCOUNTER — Ambulatory Visit: Payer: 59

## 2021-02-12 ENCOUNTER — Encounter: Payer: Self-pay | Admitting: Internal Medicine

## 2021-02-12 DIAGNOSIS — R1310 Dysphagia, unspecified: Secondary | ICD-10-CM | POA: Insufficient documentation

## 2021-02-12 DIAGNOSIS — Z5111 Encounter for antineoplastic chemotherapy: Secondary | ICD-10-CM | POA: Insufficient documentation

## 2021-02-12 DIAGNOSIS — C770 Secondary and unspecified malignant neoplasm of lymph nodes of head, face and neck: Secondary | ICD-10-CM | POA: Diagnosis not present

## 2021-02-12 DIAGNOSIS — F17210 Nicotine dependence, cigarettes, uncomplicated: Secondary | ICD-10-CM | POA: Insufficient documentation

## 2021-02-12 DIAGNOSIS — C3411 Malignant neoplasm of upper lobe, right bronchus or lung: Secondary | ICD-10-CM | POA: Insufficient documentation

## 2021-02-12 DIAGNOSIS — K589 Irritable bowel syndrome without diarrhea: Secondary | ICD-10-CM | POA: Insufficient documentation

## 2021-02-12 DIAGNOSIS — Z510 Encounter for antineoplastic radiation therapy: Secondary | ICD-10-CM | POA: Insufficient documentation

## 2021-02-12 DIAGNOSIS — Z79899 Other long term (current) drug therapy: Secondary | ICD-10-CM | POA: Insufficient documentation

## 2021-02-13 ENCOUNTER — Ambulatory Visit
Admission: RE | Admit: 2021-02-13 | Discharge: 2021-02-13 | Disposition: A | Discharge status: Home / Self Care | Payer: 59 | Source: Ambulatory Visit | Attending: Radiation Oncology | Admitting: Radiation Oncology

## 2021-02-13 ENCOUNTER — Other Ambulatory Visit: Payer: Self-pay

## 2021-02-13 DIAGNOSIS — Z5111 Encounter for antineoplastic chemotherapy: Secondary | ICD-10-CM | POA: Diagnosis not present

## 2021-02-13 NOTE — Telephone Encounter (Signed)
2 msgs left and no call back  Closing per protocol

## 2021-02-17 ENCOUNTER — Other Ambulatory Visit: Payer: Self-pay

## 2021-02-17 ENCOUNTER — Other Ambulatory Visit: Payer: Self-pay | Admitting: Radiation Oncology

## 2021-02-17 ENCOUNTER — Ambulatory Visit
Admission: RE | Admit: 2021-02-17 | Discharge: 2021-02-17 | Disposition: A | Discharge status: Home / Self Care | Payer: 59 | Source: Ambulatory Visit | Attending: Radiation Oncology | Admitting: Radiation Oncology

## 2021-02-17 DIAGNOSIS — Z5111 Encounter for antineoplastic chemotherapy: Secondary | ICD-10-CM | POA: Diagnosis not present

## 2021-02-17 DIAGNOSIS — C3491 Malignant neoplasm of unspecified part of right bronchus or lung: Secondary | ICD-10-CM

## 2021-02-17 MED ORDER — LIDOCAINE VISCOUS HCL 2 % MT SOLN
OROMUCOSAL | 0 refills | Status: DC
Start: 2021-02-17 — End: 2021-03-24

## 2021-02-17 MED FILL — Dexamethasone Sodium Phosphate Inj 100 MG/10ML: INTRAMUSCULAR | Qty: 1 | Status: AC

## 2021-02-18 ENCOUNTER — Inpatient Hospital Stay: Payer: 59

## 2021-02-18 ENCOUNTER — Inpatient Hospital Stay: Payer: 59 | Attending: Internal Medicine

## 2021-02-18 ENCOUNTER — Ambulatory Visit
Admission: RE | Admit: 2021-02-18 | Discharge: 2021-02-18 | Disposition: A | Discharge status: Home / Self Care | Payer: 59 | Source: Ambulatory Visit | Attending: Radiation Oncology | Admitting: Radiation Oncology

## 2021-02-18 VITALS — BP 103/67 | HR 92 | Temp 99.5°F | Resp 18 | Wt 198.3 lb

## 2021-02-18 DIAGNOSIS — C3491 Malignant neoplasm of unspecified part of right bronchus or lung: Secondary | ICD-10-CM

## 2021-02-18 DIAGNOSIS — Z5111 Encounter for antineoplastic chemotherapy: Secondary | ICD-10-CM

## 2021-02-18 LAB — CMP (CANCER CENTER ONLY)
ALT: 9 U/L (ref 0–44)
AST: 13 U/L — ABNORMAL LOW (ref 15–41)
Albumin: 3.7 g/dL (ref 3.5–5.0)
Alkaline Phosphatase: 91 U/L (ref 38–126)
Anion gap: 10 (ref 5–15)
BUN: 10 mg/dL (ref 6–20)
CO2: 21 mmol/L — ABNORMAL LOW (ref 22–32)
Calcium: 9.4 mg/dL (ref 8.9–10.3)
Chloride: 107 mmol/L (ref 98–111)
Creatinine: 0.71 mg/dL (ref 0.44–1.00)
GFR, Estimated: >60 mL/min (ref >60)
Glucose, Bld: 92 mg/dL (ref 70–99)
Potassium: 3.7 mmol/L (ref 3.5–5.1)
Sodium: 138 mmol/L (ref 135–145)
Total Bilirubin: 0.3 mg/dL (ref 0.3–1.2)
Total Protein: 7.4 g/dL (ref 6.5–8.1)

## 2021-02-18 LAB — CBC WITH DIFFERENTIAL (CANCER CENTER ONLY)
Abs Immature Granulocytes: 0.02 10*3/uL (ref 0.00–0.07)
Basophils Absolute: 0 10*3/uL (ref 0.0–0.1)
Basophils Relative: 1 %
Eosinophils Absolute: 0.1 10*3/uL (ref 0.0–0.5)
Eosinophils Relative: 1 %
HCT: 33.7 % — ABNORMAL LOW (ref 36.0–46.0)
Hemoglobin: 11.5 g/dL — ABNORMAL LOW (ref 12.0–15.0)
Immature Granulocytes: 0 %
Lymphocytes Relative: 11 %
Lymphs Abs: 0.7 10*3/uL (ref 0.7–4.0)
MCH: 29.9 pg (ref 26.0–34.0)
MCHC: 34.1 g/dL (ref 30.0–36.0)
MCV: 87.5 fL (ref 80.0–100.0)
Monocytes Absolute: 0.5 10*3/uL (ref 0.1–1.0)
Monocytes Relative: 9 %
Neutro Abs: 4.9 10*3/uL (ref 1.7–7.7)
Neutrophils Relative %: 78 %
Platelet Count: 241 10*3/uL (ref 150–400)
RBC: 3.85 MIL/uL — ABNORMAL LOW (ref 3.87–5.11)
RDW: 15.7 % — ABNORMAL HIGH (ref 11.5–15.5)
WBC Count: 6.2 10*3/uL (ref 4.0–10.5)
nRBC: 0 % (ref 0.0–0.2)

## 2021-02-18 LAB — PREGNANCY, URINE: Preg Test, Ur: NEGATIVE

## 2021-02-18 MED ORDER — FAMOTIDINE 20 MG IN NS 100 ML IVPB
20.0000 mg | Freq: Once | INTRAVENOUS | Status: AC
  Administered 2021-02-18: 20 mg via INTRAVENOUS
  Filled 2021-02-18: qty 100

## 2021-02-18 MED ORDER — SODIUM CHLORIDE 0.9 % IV SOLN: Freq: Once | INTRAVENOUS | Status: AC

## 2021-02-18 MED ORDER — SODIUM CHLORIDE 0.9 % IV SOLN
10.0000 mg | Freq: Once | INTRAVENOUS | Status: AC
  Administered 2021-02-18: 10 mg via INTRAVENOUS
  Filled 2021-02-18: qty 10

## 2021-02-18 MED ORDER — SODIUM CHLORIDE 0.9 % IV SOLN
291.2000 mg | Freq: Once | INTRAVENOUS | Status: AC
  Administered 2021-02-18: 290 mg via INTRAVENOUS
  Filled 2021-02-18: qty 29

## 2021-02-18 MED ORDER — SODIUM CHLORIDE 0.9 % IV SOLN
45.0000 mg/m2 | Freq: Once | INTRAVENOUS | Status: AC
  Administered 2021-02-18: 90 mg via INTRAVENOUS
  Filled 2021-02-18: qty 15

## 2021-02-18 MED ORDER — PALONOSETRON HCL INJECTION 0.25 MG/5ML
0.2500 mg | Freq: Once | INTRAVENOUS | Status: AC
  Administered 2021-02-18: 0.25 mg via INTRAVENOUS
  Filled 2021-02-18: qty 5

## 2021-02-18 MED ORDER — DIPHENHYDRAMINE HCL 50 MG/ML IJ SOLN
50.0000 mg | Freq: Once | INTRAMUSCULAR | Status: AC
  Administered 2021-02-18: 50 mg via INTRAVENOUS
  Filled 2021-02-18: qty 1

## 2021-02-18 NOTE — Patient Instructions (Signed)
[facility]  Discharge Instructions: Thank you for choosing [facility] to provide your oncology and hematology care.   If you have a lab appointment with the [facility], please go directly to the [facility] and check in at the registration area.   Wear comfortable clothing and clothing appropriate for easy access to any Portacath or PICC line.   We strive to give you quality time with your provider. You may need to reschedule your appointment if you arrive late (15 or more minutes).  Arriving late affects you and other patients whose appointments are after yours.  Also, if you miss three or more appointments without notifying the office, you may be dismissed from the clinic at the provider's discretion.      For prescription refill requests, have your pharmacy contact our office and allow 72 hours for refills to be completed.    Today you received the following chemotherapy and/or immunotherapy agents Paclitaxel and Carboplatin      To help prevent nausea and vomiting after your treatment, we encourage you to take your nausea medication as directed.  BELOW ARE SYMPTOMS THAT SHOULD BE REPORTED IMMEDIATELY: *FEVER GREATER THAN 100.4 F (38 C) OR HIGHER *CHILLS OR SWEATING *NAUSEA AND VOMITING THAT IS NOT CONTROLLED WITH YOUR NAUSEA MEDICATION *UNUSUAL SHORTNESS OF BREATH *UNUSUAL BRUISING OR BLEEDING *URINARY PROBLEMS (pain or burning when urinating, or frequent urination) *BOWEL PROBLEMS (unusual diarrhea, constipation, pain near the anus) TENDERNESS IN MOUTH AND THROAT WITH OR WITHOUT PRESENCE OF ULCERS (sore throat, sores in mouth, or a toothache) UNUSUAL RASH, SWELLING OR PAIN  UNUSUAL VAGINAL DISCHARGE OR ITCHING   Items with * indicate a potential emergency and should be followed up as soon as possible or go to the Emergency Department if any problems should occur.  Please show the CHEMOTHERAPY ALERT CARD or IMMUNOTHERAPY ALERT CARD  at check-in to the Emergency Department and triage nurse.  Should you have questions after your visit or need to cancel or reschedule your appointment, please contact [facility]  Dept: [phone]  and follow the prompts.  Office hours are 8:00 a.m. to 4:30 p.m. Monday - Friday. Please note that voicemails left after 4:00 p.m. may not be returned until the following business day.  We are closed weekends and major holidays. You have access to a nurse at all times for urgent questions. Please call the main number to the clinic Dept: [phone] and follow the prompts.   For any non-urgent questions, you may also contact your provider using MyChart. We now offer e-Visits for anyone 87 and older to request care online for non-urgent symptoms. For details visit [email or web address].   Also download the MyChart app! Go to the app store, search "MyChart", open the app, select [facility], and log in with your MyChart username and password.  Due to Covid, a mask is required upon entering the hospital/clinic. If you do not have a mask, one will be given to you upon arrival. For doctor visits, patients may have 1 support person aged 52 or older with them. For treatment visits, patients cannot have anyone with them due to current Covid guidelines and our immunocompromised population.

## 2021-02-18 NOTE — Progress Notes (Signed)
[facility] OFFICE PROGRESS NOTE  Koirala, Dibas, MD [address] [place] [id number]  DIAGNOSIS: Stage IIIC (T4, N3, M0) non-small cell lung cancer, adenocarcinoma presented with large right upper lobe lung mass with associated narrowing and occlusion of the right pulmonary artery superior trunk as well as narrowing of the interlobular right pulmonary artery and encasement and narrowing of the right upper lobe bronchus in addition to right hilar and subcarinal lymphadenopathy, and left supraclavicular nodal metastasis. and suspicious lesion in the liver although this did not show hypermetabolic activity on PET scan.  This was diagnosed in August 2022.  Molecular studies by [employer] showed no actionable mutations.  PRIOR THERAPY: None  CURRENT THERAPY: Concurrent chemoradiation with weekly carboplatin for AUC of 2 and paclitaxel 45 Mg/M2.  Status post 3 cycles.   INTERVAL HISTORY: Abigail Clark 45 y.o. female returns to the clinic today for a follow-up visit. The patient is feeling well today without any concerning complaints except for she is feeling sleepy today. The patient continues to tolerate treatment with concurrent chemoradiation well without any adverse effects. Denies any fever, chills, night sweats, or weight loss. Denies any chest pain, shortness of breath, cough, or hemoptysis. She had mild nausea last night that resolved spontaneously. She also has mild constipation. She drinks water and it reportedly improves. Her last bowel movement was this morning. Denies any vomiting or diarrhea. Denies any headache or visual changes. Her last day of radiation is scheduled for 03/09/21. The patient is here today for evaluation prior to starting cycle # 4     MEDICAL HISTORY: Past Medical History:  Diagnosis Date   Cataract    RIGHT EYE   Eczema    Exotropia of right eye 11/2014   Irritable bowel syndrome (IBS)    no current med.   Lactose  intolerance     ALLERGIES:  is allergic to penicillins.  MEDICATIONS:  Current Outpatient Medications  Medication Sig Dispense Refill   cholecalciferol (VITAMIN D3) 25 MCG (1000 UNIT) tablet Take 1,000 Units by mouth daily.     doxycycline (DORYX) 100 MG EC tablet Take 100 mg by mouth 2 (two) times daily. "For my face"     lidocaine (XYLOCAINE) 2 % solution Patient: Mix 1part 2% viscous lidocaine, 1part H20. Swallow 10mL of diluted mixture, 47min before meals and at bedtime, up to QID 200 mL 0   Multiple Vitamin (MULTIVITAMIN) tablet Take 1 tablet by mouth daily.     prochlorperazine (COMPAZINE) 10 MG tablet Take 1 tablet (10 mg total) by mouth every 6 (six) hours as needed for nausea or vomiting. 30 tablet 0   sucralfate (CARAFATE) 1 g tablet Take 1 tablet (1 g total) by mouth 4 (four) times daily -  with meals and at bedtime. Crush and dissolve in 10 mL of warm water prior to swallowing 120 tablet 1   tretinoin (RETIN-A) 0.025 % gel Apply topically.     UNABLE TO FIND Sea moss, burdock root, macca,     vitamin C (ASCORBIC ACID) 250 MG tablet Take 250 mg by mouth daily.     vitamin E 1000 UNIT capsule Take by mouth daily.     No current facility-administered medications for this visit.   Facility-Administered Medications Ordered in Other Visits  Medication Dose Route Frequency Provider Last Rate Last Admin   0.9 %  sodium chloride infusion   Intravenous Once Curt Bears, MD       CARBOplatin (PARAPLATIN)  290 mg in sodium chloride 0.9 % 100 mL chemo infusion  290 mg Intravenous Once Curt Bears, MD       dexamethasone (DECADRON) 10 mg in sodium chloride 0.9 % 50 mL IVPB  10 mg Intravenous Once Curt Bears, MD       diphenhydrAMINE (BENADRYL) injection 50 mg  50 mg Intravenous Once Curt Bears, MD       famotidine (PEPCID) IVPB 20 mg in NS 100 mL IVPB  20 mg Intravenous Once Curt Bears, MD       PACLitaxel (TAXOL) 90 mg in sodium chloride 0.9 % 250 mL chemo  infusion (</= 80mg /m2)  45 mg/m2 (Treatment Plan Recorded) Intravenous Once Curt Bears, MD       palonosetron (ALOXI) injection 0.25 mg  0.25 mg Intravenous Once Curt Bears, MD        SURGICAL HISTORY:  Past Surgical History:  Procedure Laterality Date   BRONCHIAL BIOPSY  01/12/2021   Procedure: BRONCHIAL BIOPSIES;  Surgeon: Spero Geralds, MD;  Location: [facility] ENDOSCOPY;  Service: Pulmonary;;   BRONCHIAL NEEDLE ASPIRATION BIOPSY  01/12/2021   Procedure: BRONCHIAL NEEDLE ASPIRATION BIOPSIES;  Surgeon: Spero Geralds, MD;  Location: [facility] ENDOSCOPY;  Service: Pulmonary;;   CATARACT PEDIATRIC Right 1986   LEEP  05/20/2006   LIPOSUCTION  10/2014   abd. - local anes.   STRABISMUS SURGERY Right 11/29/2014   Procedure: REPAIR STRABISMUS RIGHT EYE ;  Surgeon: Everitt Amber, MD;  Location: [facility];  Service: Ophthalmology;  Laterality: Right;   VIDEO BRONCHOSCOPY WITH ENDOBRONCHIAL ULTRASOUND N/A 01/12/2021   Procedure: VIDEO BRONCHOSCOPY WITH ENDOBRONCHIAL ULTRASOUND;  Surgeon: Spero Geralds, MD;  Location: [facility] ENDOSCOPY;  Service: Pulmonary;  Laterality: N/A;   WISDOM TOOTH EXTRACTION      REVIEW OF SYSTEMS:   Review of Systems  Constitutional: Positive for fatigue. Negative for appetite change, chills, fever and unexpected weight change.  HENT: Negative for mouth sores, nosebleeds, sore throat and trouble swallowing.   Eyes: Negative for eye problems and icterus.  Respiratory: Negative for cough, hemoptysis, shortness of breath and wheezing.   Cardiovascular: Negative for chest pain and leg swelling.  Gastrointestinal: Positive for mild self limiting nausea/constipation. Negative for abdominal pain, diarrhea, and vomiting.  Genitourinary: Negative for bladder incontinence, difficulty urinating, dysuria, frequency and hematuria.   Musculoskeletal: Negative for back pain, gait problem, neck pain and neck stiffness.  Skin: Negative for itching and rash.  Neurological:  Negative for dizziness, extremity weakness, gait problem, headaches, light-headedness and seizures.  Hematological: Negative for adenopathy. Does not bruise/bleed easily.  Psychiatric/Behavioral: Negative for confusion, depression and sleep disturbance. The patient is not nervous/anxious.     PHYSICAL EXAMINATION:  Blood pressure 111/76, pulse 81, temperature (!) 97.2 F (36.2 C), temperature source Oral, resp. rate 18, weight 199 lb 1.6 oz (90.3 kg), SpO2 100 %.  ECOG PERFORMANCE STATUS: 1  Physical Exam  Constitutional: Oriented to person, place, and time and well-developed, well-nourished, and in no distress.  HENT:  Head: Normocephalic and atraumatic.  Mouth/Throat: Oropharynx is clear and moist. No oropharyngeal exudate.  Eyes: Conjunctivae are normal. Right eye exhibits no discharge. Left eye exhibits no discharge. No scleral icterus.  Neck: Normal range of motion. Neck supple.  Cardiovascular: Normal rate, regular rhythm, normal heart sounds and intact distal pulses.   Pulmonary/Chest: Effort normal and breath sounds normal. No respiratory distress. No wheezes. No rales.  Abdominal: Soft. Bowel sounds are normal. Exhibits no distension and no mass. There is no  tenderness.  Musculoskeletal: Normal range of motion. Exhibits no edema.  Lymphadenopathy:    No cervical adenopathy.  Neurological: Alert and oriented to person, place, and time. Exhibits normal muscle tone. Gait normal. Coordination normal.  Skin: Skin is warm and dry. No rash noted. Not diaphoretic. No erythema. No pallor.  Psychiatric: Mood, memory and judgment normal.  Vitals reviewed.  LABORATORY DATA: Lab Results  Component Value Date   WBC 4.7 02/23/2021   HGB 11.5 (L) 02/23/2021   HCT 34.4 (L) 02/23/2021   MCV 86.6 02/23/2021   PLT 178 02/23/2021      Chemistry      Component Value Date/Time   NA 141 02/23/2021 0843   K 3.9 02/23/2021 0843   CL 110 02/23/2021 0843   CO2 22 02/23/2021 0843   BUN 9  02/23/2021 0843   CREATININE 0.71 02/23/2021 0843      Component Value Date/Time   CALCIUM 9.5 02/23/2021 0843   ALKPHOS 93 02/23/2021 0843   AST 12 (L) 02/23/2021 0843   ALT 12 02/23/2021 0843   BILITOT 0.4 02/23/2021 0843       RADIOGRAPHIC STUDIES:  MR BRAIN W WO CONTRAST  Result Date: 01/27/2021 CLINICAL DATA:  Staging non-small cell lung cancer EXAM: MRI HEAD WITHOUT AND WITH CONTRAST TECHNIQUE: Multiplanar, multiecho pulse sequences of the brain and surrounding structures were obtained without and with intravenous contrast. CONTRAST:  41mL GADAVIST GADOBUTROL 1 MMOL/ML IV SOLN COMPARISON:  No prior MRI of the head. FINDINGS: Brain: No acute infarction, hemorrhage, hydrocephalus, extra-axial collection or mass lesion. No abnormal enhancement. Vascular: Normal flow voids. Skull and upper cervical spine: Normal marrow signal. Sinuses/Orbits: Normal paranasal sinuses. Status post right lens replacement. Other: The mastoid air cells are well aerated. IMPRESSION: No acute intracranial process. No evidence of metastatic disease in the brain. Electronically Signed   By: Merilyn Baba M.D.   On: 01/27/2021 16:02   NM PET Image Initial (PI) Skull Base To Thigh  Result Date: 02/02/2021 CLINICAL DATA:  Initial treatment strategy for lung cancer. EXAM: NUCLEAR MEDICINE PET SKULL BASE TO THIGH TECHNIQUE: 9.77 mCi F-18 FDG was injected intravenously. Full-ring PET imaging was performed from the skull base to thigh after the radiotracer. CT data was obtained and used for attenuation correction and anatomic localization. Fasting blood glucose: 115 mg/dl COMPARISON:  CT of the chest abdomen and pelvis from 01/12/2021. FINDINGS: Mediastinal blood pool activity: SUV max 2.46 Liver activity: SUV max NA NECK: No hypermetabolic lymph nodes in the neck. Incidental CT findings: none CHEST: FDG avid central and lateral left supraclavicular lymph nodes identified, including: 1.8 cm lateral left supraclavicular lymph  node with SUV max of 12.3, image 35/4. Central left supraclavicular lymph node measures 0.9 cm with SUV max of 8.04. FDG avid right paratracheal lymph nodes are noted, including: -high right paratracheal node measuring 1.6 cm with SUV max of 14.7, image 46/4. Low right paratracheal lymph node measures 1.4 cm within SUV max of 12.0, image 60/4. Subcarinal lymph node measures 1.3 cm with SUV max of 16.3, image 65/4. Necrotic right paratracheal node without significant FDG uptake measures 1.8 cm, image 61/4. Large partially necrotic right upper lobe lung mass measures 10.1 x 8.8 cm with SUV max of 17.07. This extends to the right lateral chest wall with loss of fat plane. Cannot rule out chest wall invasion. There is surrounding mild interlobular septal thickening within the anterior right upper lobe which may reflect lymphangitic spread of tumor. Satellite nodule within the medial  right upper lobe measures 1 cm no FDG avid nodule or mass noted within the left lung. Incidental CT findings: Centrilobular and paraseptal emphysema. No pericardial effusion. ABDOMEN/PELVIS: No abnormal hypermetabolic activity within the liver, pancreas, adrenal glands, or spleen. No hypermetabolic lymph nodes in the abdomen or pelvis. Incidental CT findings: Posterior right lobe of liver cyst measures 1.4 cm. Gallstone noted. SKELETON: No focal hypermetabolic activity to suggest skeletal metastasis. Incidental CT findings: none IMPRESSION: 1. Right upper lobe lung mass the is intensely hypermetabolic compatible with primary bronchogenic carcinoma. Assuming non-small cell histology imaging findings are compatible with at least T4N3M0 or stage IIIc diease. 2. FDG avid right paratracheal, subcarinal, and left supraclavicular nodal metastasis. 3.  Emphysema (ICD10-J43.9). Electronically Signed   By: Kerby Moors M.D.   On: 02/02/2021 21:32     ASSESSMENT/PLAN:  This is a very pleasant 45 year old African-American female diagnosed with a  stage IIIC (T4, N3, M0) non-small cell lung cancer, adenocarcinoma. She presented with a large right upper lobe lung mass with narrowing and occlusion of the right pulmonary artery superior trunk as well as narrowing of the interlobular right pulmonary artery and encasement and narrowing of the right upper lobe bronchus in addition to right hilar and mediastinal lymphadenopathy and suspicious liver lesion. She was diagnosed in August 2022.   The patient had molecular studies by [employer] that showed no actionable mutation.  The patient is currently undergoing a course of concurrent chemoradiation with weekly carboplatin for AUC of 2 and paclitaxel 45 Mg/M2.  Status post 3 cycles.  Labs were reviewed. Recommend that she proceed with cycle 4 today as scheduled.   She will come back for follow-up visit in 2 weeks for evaluation before starting cycle #6.   The patient was advised to call immediately if she has any concerning symptoms in the interval. The patient voices understanding of current disease status and treatment options and is in agreement with the current care plan. All questions were answered. The patient knows to call the clinic with any problems, questions or concerns. We can certainly see the patient much sooner if necessary   No orders of the defined types were placed in this encounter.     The total time spent in the appointment was 20-29 minutes.  Triva Hueber L Berklee Battey, PA-C 02/23/21

## 2021-02-19 ENCOUNTER — Other Ambulatory Visit: Payer: Self-pay

## 2021-02-19 ENCOUNTER — Ambulatory Visit
Admission: RE | Admit: 2021-02-19 | Discharge: 2021-02-19 | Disposition: A | Discharge status: Home / Self Care | Payer: 59 | Source: Ambulatory Visit | Attending: Radiation Oncology | Admitting: Radiation Oncology

## 2021-02-19 DIAGNOSIS — Z5111 Encounter for antineoplastic chemotherapy: Secondary | ICD-10-CM | POA: Diagnosis not present

## 2021-02-20 ENCOUNTER — Ambulatory Visit
Admission: RE | Admit: 2021-02-20 | Discharge: 2021-02-20 | Disposition: A | Discharge status: Home / Self Care | Payer: 59 | Source: Ambulatory Visit | Attending: Radiation Oncology | Admitting: Radiation Oncology

## 2021-02-20 DIAGNOSIS — Z5111 Encounter for antineoplastic chemotherapy: Secondary | ICD-10-CM | POA: Diagnosis not present

## 2021-02-20 MED FILL — Dexamethasone Sodium Phosphate Inj 100 MG/10ML: INTRAMUSCULAR | Qty: 1 | Status: AC

## 2021-02-23 ENCOUNTER — Ambulatory Visit
Admission: RE | Admit: 2021-02-23 | Discharge: 2021-02-23 | Disposition: A | Discharge status: Home / Self Care | Payer: 59 | Source: Ambulatory Visit | Attending: Radiation Oncology | Admitting: Radiation Oncology

## 2021-02-23 ENCOUNTER — Inpatient Hospital Stay: Payer: 59

## 2021-02-23 ENCOUNTER — Other Ambulatory Visit: Payer: Self-pay

## 2021-02-23 ENCOUNTER — Encounter: Payer: Self-pay | Admitting: Physician Assistant

## 2021-02-23 ENCOUNTER — Inpatient Hospital Stay (HOSPITAL_BASED_OUTPATIENT_CLINIC_OR_DEPARTMENT_OTHER): Payer: 59 | Admitting: Physician Assistant

## 2021-02-23 VITALS — BP 111/76 | HR 81 | Temp 97.2°F | Resp 18 | Wt 199.1 lb

## 2021-02-23 DIAGNOSIS — C3491 Malignant neoplasm of unspecified part of right bronchus or lung: Secondary | ICD-10-CM

## 2021-02-23 DIAGNOSIS — Z5111 Encounter for antineoplastic chemotherapy: Secondary | ICD-10-CM

## 2021-02-23 LAB — CMP (CANCER CENTER ONLY)
ALT: 12 U/L (ref 0–44)
AST: 12 U/L — ABNORMAL LOW (ref 15–41)
Albumin: 3.6 g/dL (ref 3.5–5.0)
Alkaline Phosphatase: 93 U/L (ref 38–126)
Anion gap: 9 (ref 5–15)
BUN: 9 mg/dL (ref 6–20)
CO2: 22 mmol/L (ref 22–32)
Calcium: 9.5 mg/dL (ref 8.9–10.3)
Chloride: 110 mmol/L (ref 98–111)
Creatinine: 0.71 mg/dL (ref 0.44–1.00)
GFR, Estimated: >60 mL/min (ref >60)
Glucose, Bld: 105 mg/dL — ABNORMAL HIGH (ref 70–99)
Potassium: 3.9 mmol/L (ref 3.5–5.1)
Sodium: 141 mmol/L (ref 135–145)
Total Bilirubin: 0.4 mg/dL (ref 0.3–1.2)
Total Protein: 7.3 g/dL (ref 6.5–8.1)

## 2021-02-23 LAB — CBC WITH DIFFERENTIAL (CANCER CENTER ONLY)
Abs Immature Granulocytes: 0.02 10*3/uL (ref 0.00–0.07)
Basophils Absolute: 0 10*3/uL (ref 0.0–0.1)
Basophils Relative: 0 %
Eosinophils Absolute: 0.1 10*3/uL (ref 0.0–0.5)
Eosinophils Relative: 2 %
HCT: 34.4 % — ABNORMAL LOW (ref 36.0–46.0)
Hemoglobin: 11.5 g/dL — ABNORMAL LOW (ref 12.0–15.0)
Immature Granulocytes: 0 %
Lymphocytes Relative: 13 %
Lymphs Abs: 0.6 10*3/uL — ABNORMAL LOW (ref 0.7–4.0)
MCH: 29 pg (ref 26.0–34.0)
MCHC: 33.4 g/dL (ref 30.0–36.0)
MCV: 86.6 fL (ref 80.0–100.0)
Monocytes Absolute: 0.3 10*3/uL (ref 0.1–1.0)
Monocytes Relative: 6 %
Neutro Abs: 3.7 10*3/uL (ref 1.7–7.7)
Neutrophils Relative %: 79 %
Platelet Count: 178 10*3/uL (ref 150–400)
RBC: 3.97 MIL/uL (ref 3.87–5.11)
RDW: 15.4 % (ref 11.5–15.5)
WBC Count: 4.7 10*3/uL (ref 4.0–10.5)
nRBC: 0 % (ref 0.0–0.2)

## 2021-02-23 MED ORDER — SODIUM CHLORIDE 0.9 % IV SOLN
10.0000 mg | Freq: Once | INTRAVENOUS | Status: AC
  Administered 2021-02-23: 10 mg via INTRAVENOUS
  Filled 2021-02-23: qty 10

## 2021-02-23 MED ORDER — DIPHENHYDRAMINE HCL 50 MG/ML IJ SOLN
50.0000 mg | Freq: Once | INTRAMUSCULAR | Status: AC
  Administered 2021-02-23: 50 mg via INTRAVENOUS
  Filled 2021-02-23: qty 1

## 2021-02-23 MED ORDER — PALONOSETRON HCL INJECTION 0.25 MG/5ML
0.2500 mg | Freq: Once | INTRAVENOUS | Status: AC
  Administered 2021-02-23: 0.25 mg via INTRAVENOUS
  Filled 2021-02-23: qty 5

## 2021-02-23 MED ORDER — FAMOTIDINE 20 MG IN NS 100 ML IVPB
20.0000 mg | Freq: Once | INTRAVENOUS | Status: AC
  Administered 2021-02-23: 20 mg via INTRAVENOUS
  Filled 2021-02-23: qty 100

## 2021-02-23 MED ORDER — SODIUM CHLORIDE 0.9 % IV SOLN
45.0000 mg/m2 | Freq: Once | INTRAVENOUS | Status: AC
  Administered 2021-02-23: 90 mg via INTRAVENOUS
  Filled 2021-02-23: qty 15

## 2021-02-23 MED ORDER — SODIUM CHLORIDE 0.9 % IV SOLN: Freq: Once | INTRAVENOUS | Status: AC

## 2021-02-23 MED ORDER — SODIUM CHLORIDE 0.9 % IV SOLN
291.2000 mg | Freq: Once | INTRAVENOUS | Status: AC
  Administered 2021-02-23: 290 mg via INTRAVENOUS
  Filled 2021-02-23: qty 29

## 2021-02-23 NOTE — Patient Instructions (Signed)
[facility] MEDICAL ONCOLOGY  Discharge Instructions: Thank you for choosing [facility] to provide your oncology and hematology care.   If you have a lab appointment with the Cancer Center, please go directly to the Cancer Center and check in at the registration area.   Wear comfortable clothing and clothing appropriate for easy access to any Portacath or PICC line.   We strive to give you quality time with your provider. You may need to reschedule your appointment if you arrive late (15 or more minutes).  Arriving late affects you and other patients whose appointments are after yours.  Also, if you miss three or more appointments without notifying the office, you may be dismissed from the clinic at the provider's discretion.      For prescription refill requests, have your pharmacy contact our office and allow 72 hours for refills to be completed.    Today you received the following chemotherapy and/or immunotherapy agents: Paclitaxel (Taxol) and Carboplatin.   To help prevent nausea and vomiting after your treatment, we encourage you to take your nausea medication as directed.  BELOW ARE SYMPTOMS THAT SHOULD BE REPORTED IMMEDIATELY: *FEVER GREATER THAN 100.4 F (38 C) OR HIGHER *CHILLS OR SWEATING *NAUSEA AND VOMITING THAT IS NOT CONTROLLED WITH YOUR NAUSEA MEDICATION *UNUSUAL SHORTNESS OF BREATH *UNUSUAL BRUISING OR BLEEDING *URINARY PROBLEMS (pain or burning when urinating, or frequent urination) *BOWEL PROBLEMS (unusual diarrhea, constipation, pain near the anus) TENDERNESS IN MOUTH AND THROAT WITH OR WITHOUT PRESENCE OF ULCERS (sore throat, sores in mouth, or a toothache) UNUSUAL RASH, SWELLING OR PAIN  UNUSUAL VAGINAL DISCHARGE OR ITCHING   Items with * indicate a potential emergency and should be followed up as soon as possible or go to the Emergency Department if any problems should occur.  Please show the CHEMOTHERAPY ALERT CARD or IMMUNOTHERAPY  ALERT CARD at check-in to the Emergency Department and triage nurse.  Should you have questions after your visit or need to cancel or reschedule your appointment, please contact [facility]  Dept: [phone]  and follow the prompts.  Office hours are 8:00 a.m. to 4:30 p.m. Monday - Friday. Please note that voicemails left after 4:00 p.m. may not be returned until the following business day.  We are closed weekends and major holidays. You have access to a nurse at all times for urgent questions. Please call the main number to the clinic Dept: [phone] and follow the prompts.   For any non-urgent questions, you may also contact your provider using MyChart. We now offer e-Visits for anyone 18 and older to request care online for non-urgent symptoms. For details visit [email or web address].   Also download the MyChart app! Go to the app store, search "MyChart", open the app, select [facility], and log in with your MyChart username and password.  Due to Covid, a mask is required upon entering the hospital/clinic. If you do not have a mask, one will be given to you upon arrival. For doctor visits, patients may have 1 support person aged 18 or older with them. For treatment visits, patients cannot have anyone with them due to current Covid guidelines and our immunocompromised population.   

## 2021-02-24 ENCOUNTER — Encounter (HOSPITAL_COMMUNITY): Payer: Self-pay

## 2021-02-24 ENCOUNTER — Ambulatory Visit
Admission: RE | Admit: 2021-02-24 | Discharge: 2021-02-24 | Disposition: A | Discharge status: Home / Self Care | Payer: 59 | Source: Ambulatory Visit | Attending: Radiation Oncology | Admitting: Radiation Oncology

## 2021-02-24 DIAGNOSIS — Z5111 Encounter for antineoplastic chemotherapy: Secondary | ICD-10-CM | POA: Diagnosis not present

## 2021-02-25 ENCOUNTER — Other Ambulatory Visit: Payer: Self-pay

## 2021-02-25 ENCOUNTER — Ambulatory Visit
Admission: RE | Admit: 2021-02-25 | Discharge: 2021-02-25 | Disposition: A | Discharge status: Home / Self Care | Payer: 59 | Source: Ambulatory Visit | Attending: Radiation Oncology | Admitting: Radiation Oncology

## 2021-02-25 DIAGNOSIS — Z5111 Encounter for antineoplastic chemotherapy: Secondary | ICD-10-CM | POA: Diagnosis not present

## 2021-02-26 ENCOUNTER — Ambulatory Visit: Payer: 59

## 2021-02-26 ENCOUNTER — Ambulatory Visit
Admission: RE | Admit: 2021-02-26 | Discharge: 2021-02-26 | Disposition: A | Discharge status: Home / Self Care | Payer: 59 | Source: Ambulatory Visit | Attending: Radiation Oncology | Admitting: Radiation Oncology

## 2021-02-26 DIAGNOSIS — Z5111 Encounter for antineoplastic chemotherapy: Secondary | ICD-10-CM | POA: Diagnosis not present

## 2021-02-27 ENCOUNTER — Ambulatory Visit
Admission: RE | Admit: 2021-02-27 | Discharge: 2021-02-27 | Disposition: A | Discharge status: Home / Self Care | Payer: 59 | Source: Ambulatory Visit | Attending: Radiation Oncology | Admitting: Radiation Oncology

## 2021-02-27 ENCOUNTER — Other Ambulatory Visit: Payer: Self-pay

## 2021-02-27 DIAGNOSIS — Z5111 Encounter for antineoplastic chemotherapy: Secondary | ICD-10-CM | POA: Diagnosis not present

## 2021-02-27 MED FILL — Dexamethasone Sodium Phosphate Inj 100 MG/10ML: INTRAMUSCULAR | Qty: 1 | Status: AC

## 2021-03-02 ENCOUNTER — Inpatient Hospital Stay: Payer: 59

## 2021-03-02 ENCOUNTER — Other Ambulatory Visit: Payer: Self-pay

## 2021-03-02 ENCOUNTER — Ambulatory Visit
Admission: RE | Admit: 2021-03-02 | Discharge: 2021-03-02 | Disposition: A | Discharge status: Home / Self Care | Payer: 59 | Source: Ambulatory Visit | Attending: Radiation Oncology | Admitting: Radiation Oncology

## 2021-03-02 VITALS — BP 121/70 | HR 85 | Temp 98.0°F | Resp 17 | Ht 67.0 in | Wt 198.7 lb

## 2021-03-02 DIAGNOSIS — Z5111 Encounter for antineoplastic chemotherapy: Secondary | ICD-10-CM

## 2021-03-02 DIAGNOSIS — C3491 Malignant neoplasm of unspecified part of right bronchus or lung: Secondary | ICD-10-CM

## 2021-03-02 LAB — CBC WITH DIFFERENTIAL (CANCER CENTER ONLY)
Abs Immature Granulocytes: 0.01 10*3/uL (ref 0.00–0.07)
Basophils Absolute: 0 10*3/uL (ref 0.0–0.1)
Basophils Relative: 0 %
Eosinophils Absolute: 0.1 10*3/uL (ref 0.0–0.5)
Eosinophils Relative: 2 %
HCT: 32.7 % — ABNORMAL LOW (ref 36.0–46.0)
Hemoglobin: 11.1 g/dL — ABNORMAL LOW (ref 12.0–15.0)
Immature Granulocytes: 0 %
Lymphocytes Relative: 9 %
Lymphs Abs: 0.5 10*3/uL — ABNORMAL LOW (ref 0.7–4.0)
MCH: 29.5 pg (ref 26.0–34.0)
MCHC: 33.9 g/dL (ref 30.0–36.0)
MCV: 87 fL (ref 80.0–100.0)
Monocytes Absolute: 0.3 10*3/uL (ref 0.1–1.0)
Monocytes Relative: 6 %
Neutro Abs: 4.3 10*3/uL (ref 1.7–7.7)
Neutrophils Relative %: 83 %
Platelet Count: 130 10*3/uL — ABNORMAL LOW (ref 150–400)
RBC: 3.76 MIL/uL — ABNORMAL LOW (ref 3.87–5.11)
RDW: 15.8 % — ABNORMAL HIGH (ref 11.5–15.5)
WBC Count: 5.2 10*3/uL (ref 4.0–10.5)
nRBC: 0 % (ref 0.0–0.2)

## 2021-03-02 LAB — CMP (CANCER CENTER ONLY)
ALT: 13 U/L (ref 0–44)
AST: 12 U/L — ABNORMAL LOW (ref 15–41)
Albumin: 3.7 g/dL (ref 3.5–5.0)
Alkaline Phosphatase: 81 U/L (ref 38–126)
Anion gap: 9 (ref 5–15)
BUN: 9 mg/dL (ref 6–20)
CO2: 21 mmol/L — ABNORMAL LOW (ref 22–32)
Calcium: 9.3 mg/dL (ref 8.9–10.3)
Chloride: 109 mmol/L (ref 98–111)
Creatinine: 0.72 mg/dL (ref 0.44–1.00)
GFR, Estimated: >60 mL/min (ref >60)
Glucose, Bld: 107 mg/dL — ABNORMAL HIGH (ref 70–99)
Potassium: 3.9 mmol/L (ref 3.5–5.1)
Sodium: 139 mmol/L (ref 135–145)
Total Bilirubin: 0.4 mg/dL (ref 0.3–1.2)
Total Protein: 7.4 g/dL (ref 6.5–8.1)

## 2021-03-02 LAB — PREGNANCY, URINE: Preg Test, Ur: NEGATIVE

## 2021-03-02 MED ORDER — SODIUM CHLORIDE 0.9 % IV SOLN: Freq: Once | INTRAVENOUS | Status: AC

## 2021-03-02 MED ORDER — FAMOTIDINE 20 MG IN NS 100 ML IVPB
20.0000 mg | Freq: Once | INTRAVENOUS | Status: AC
  Administered 2021-03-02: 20 mg via INTRAVENOUS
  Filled 2021-03-02: qty 100

## 2021-03-02 MED ORDER — SODIUM CHLORIDE 0.9 % IV SOLN
10.0000 mg | Freq: Once | INTRAVENOUS | Status: AC
  Administered 2021-03-02: 10 mg via INTRAVENOUS
  Filled 2021-03-02: qty 10

## 2021-03-02 MED ORDER — PALONOSETRON HCL INJECTION 0.25 MG/5ML
0.2500 mg | Freq: Once | INTRAVENOUS | Status: AC
  Administered 2021-03-02: 0.25 mg via INTRAVENOUS
  Filled 2021-03-02: qty 5

## 2021-03-02 MED ORDER — DIPHENHYDRAMINE HCL 50 MG/ML IJ SOLN
50.0000 mg | Freq: Once | INTRAMUSCULAR | Status: AC
  Administered 2021-03-02: 50 mg via INTRAVENOUS
  Filled 2021-03-02: qty 1

## 2021-03-02 MED ORDER — SODIUM CHLORIDE 0.9 % IV SOLN
291.2000 mg | Freq: Once | INTRAVENOUS | Status: AC
  Administered 2021-03-02: 290 mg via INTRAVENOUS
  Filled 2021-03-02: qty 29

## 2021-03-02 MED ORDER — SODIUM CHLORIDE 0.9 % IV SOLN
45.0000 mg/m2 | Freq: Once | INTRAVENOUS | Status: AC
  Administered 2021-03-02: 90 mg via INTRAVENOUS
  Filled 2021-03-02: qty 15

## 2021-03-03 ENCOUNTER — Ambulatory Visit
Admission: RE | Admit: 2021-03-03 | Discharge: 2021-03-03 | Disposition: A | Discharge status: Home / Self Care | Payer: 59 | Source: Ambulatory Visit | Attending: Radiation Oncology | Admitting: Radiation Oncology

## 2021-03-03 DIAGNOSIS — Z5111 Encounter for antineoplastic chemotherapy: Secondary | ICD-10-CM | POA: Diagnosis not present

## 2021-03-04 ENCOUNTER — Other Ambulatory Visit: Payer: Self-pay

## 2021-03-04 ENCOUNTER — Ambulatory Visit
Admission: RE | Admit: 2021-03-04 | Discharge: 2021-03-04 | Disposition: A | Discharge status: Home / Self Care | Payer: 59 | Source: Ambulatory Visit | Attending: Radiation Oncology | Admitting: Radiation Oncology

## 2021-03-04 DIAGNOSIS — Z5111 Encounter for antineoplastic chemotherapy: Secondary | ICD-10-CM | POA: Diagnosis not present

## 2021-03-05 ENCOUNTER — Ambulatory Visit
Admission: RE | Admit: 2021-03-05 | Discharge: 2021-03-05 | Disposition: A | Discharge status: Home / Self Care | Payer: 59 | Source: Ambulatory Visit | Attending: Radiation Oncology | Admitting: Radiation Oncology

## 2021-03-05 DIAGNOSIS — Z5111 Encounter for antineoplastic chemotherapy: Secondary | ICD-10-CM | POA: Diagnosis not present

## 2021-03-06 ENCOUNTER — Ambulatory Visit
Admission: RE | Admit: 2021-03-06 | Discharge: 2021-03-06 | Disposition: A | Discharge status: Home / Self Care | Payer: 59 | Source: Ambulatory Visit | Attending: Radiation Oncology | Admitting: Radiation Oncology

## 2021-03-06 ENCOUNTER — Other Ambulatory Visit: Payer: Self-pay

## 2021-03-06 DIAGNOSIS — Z5111 Encounter for antineoplastic chemotherapy: Secondary | ICD-10-CM | POA: Diagnosis not present

## 2021-03-06 MED FILL — Dexamethasone Sodium Phosphate Inj 100 MG/10ML: INTRAMUSCULAR | Qty: 1 | Status: AC

## 2021-03-09 ENCOUNTER — Inpatient Hospital Stay (HOSPITAL_BASED_OUTPATIENT_CLINIC_OR_DEPARTMENT_OTHER): Payer: 59 | Admitting: Internal Medicine

## 2021-03-09 ENCOUNTER — Encounter: Payer: Self-pay | Admitting: *Deleted

## 2021-03-09 ENCOUNTER — Encounter: Payer: Self-pay | Admitting: Radiation Oncology

## 2021-03-09 ENCOUNTER — Other Ambulatory Visit: Payer: Self-pay

## 2021-03-09 ENCOUNTER — Inpatient Hospital Stay: Payer: 59

## 2021-03-09 ENCOUNTER — Other Ambulatory Visit: Payer: Self-pay | Admitting: Radiation Oncology

## 2021-03-09 ENCOUNTER — Ambulatory Visit
Admission: RE | Admit: 2021-03-09 | Discharge: 2021-03-09 | Disposition: A | Discharge status: Home / Self Care | Payer: 59 | Source: Ambulatory Visit | Attending: Radiation Oncology | Admitting: Radiation Oncology

## 2021-03-09 VITALS — BP 122/76 | HR 115 | Temp 97.2°F | Resp 18 | Wt 187.4 lb

## 2021-03-09 DIAGNOSIS — C3491 Malignant neoplasm of unspecified part of right bronchus or lung: Secondary | ICD-10-CM

## 2021-03-09 DIAGNOSIS — C3411 Malignant neoplasm of upper lobe, right bronchus or lung: Secondary | ICD-10-CM | POA: Diagnosis not present

## 2021-03-09 DIAGNOSIS — C3490 Malignant neoplasm of unspecified part of unspecified bronchus or lung: Secondary | ICD-10-CM

## 2021-03-09 DIAGNOSIS — Z5111 Encounter for antineoplastic chemotherapy: Secondary | ICD-10-CM

## 2021-03-09 LAB — CMP (CANCER CENTER ONLY)
ALT: 13 U/L (ref 0–44)
AST: 13 U/L — ABNORMAL LOW (ref 15–41)
Albumin: 4 g/dL (ref 3.5–5.0)
Alkaline Phosphatase: 87 U/L (ref 38–126)
Anion gap: 13 (ref 5–15)
BUN: 14 mg/dL (ref 6–20)
CO2: 19 mmol/L — ABNORMAL LOW (ref 22–32)
Calcium: 9.8 mg/dL (ref 8.9–10.3)
Chloride: 106 mmol/L (ref 98–111)
Creatinine: 0.74 mg/dL (ref 0.44–1.00)
GFR, Estimated: >60 mL/min (ref >60)
Glucose, Bld: 86 mg/dL (ref 70–99)
Potassium: 3.7 mmol/L (ref 3.5–5.1)
Sodium: 138 mmol/L (ref 135–145)
Total Bilirubin: 0.7 mg/dL (ref 0.3–1.2)
Total Protein: 8 g/dL (ref 6.5–8.1)

## 2021-03-09 LAB — CBC WITH DIFFERENTIAL (CANCER CENTER ONLY)
Abs Immature Granulocytes: 0.01 10*3/uL (ref 0.00–0.07)
Basophils Absolute: 0 10*3/uL (ref 0.0–0.1)
Basophils Relative: 1 %
Eosinophils Absolute: 0 10*3/uL (ref 0.0–0.5)
Eosinophils Relative: 1 %
HCT: 33.7 % — ABNORMAL LOW (ref 36.0–46.0)
Hemoglobin: 11.6 g/dL — ABNORMAL LOW (ref 12.0–15.0)
Immature Granulocytes: 0 %
Lymphocytes Relative: 10 %
Lymphs Abs: 0.4 10*3/uL — ABNORMAL LOW (ref 0.7–4.0)
MCH: 29.9 pg (ref 26.0–34.0)
MCHC: 34.4 g/dL (ref 30.0–36.0)
MCV: 86.9 fL (ref 80.0–100.0)
Monocytes Absolute: 0.3 10*3/uL (ref 0.1–1.0)
Monocytes Relative: 6 %
Neutro Abs: 3.5 10*3/uL (ref 1.7–7.7)
Neutrophils Relative %: 82 %
Platelet Count: 187 10*3/uL (ref 150–400)
RBC: 3.88 MIL/uL (ref 3.87–5.11)
RDW: 15.8 % — ABNORMAL HIGH (ref 11.5–15.5)
WBC Count: 4.3 10*3/uL (ref 4.0–10.5)
nRBC: 0 % (ref 0.0–0.2)

## 2021-03-09 MED ORDER — OXYCODONE-ACETAMINOPHEN 5-325 MG PO TABS: 1.0000 | ORAL_TABLET | ORAL | 0 refills | Status: DC | PRN

## 2021-03-09 NOTE — Progress Notes (Signed)
Oncology Nurse Navigator Documentation  Oncology Nurse Navigator Flowsheets 03/09/2021  Abnormal Finding Date -  Confirmed Diagnosis Date -  Diagnosis Status -  Planned Course of Treatment -  Phase of Treatment Chemo  Chemotherapy Actual Start Date: 01/28/2021  Chemotherapy Actual End Date: 03/02/2021  Radiation Actual Start Date: 01/26/2021  Radiation Actual End Date: 03/09/2021  Navigator Follow Up Date: 06/08/2021  Navigator Follow Up Reason: Follow-up Appointment  Navigator Location [facility]  Navigator Encounter Type Clinic/MDC;Follow-up Appt  Treatment Initiated Date -  Patient Visit Type MedOnc  Treatment Phase Final Radiation Tx  Barriers/Navigation Needs Coordination of Care;Education  Education Other  Interventions Coordination of Care;Education;Psycho-Social Support  Acuity Level 2-Minimal Needs (1-2 Barriers Identified)  Coordination of Care Other  Education Method Verbal  Time Spent with Patient 45

## 2021-03-09 NOTE — Progress Notes (Signed)
[place] Telephone:([phone]   Fax:([phone]  OFFICE PROGRESS NOTE  Koirala, Dibas, MD [address] [place] [address]  DIAGNOSIS: stage IIIC (T4, N3, M0) non-small cell lung cancer, adenocarcinoma presented with large right upper lobe lung mass with associated narrowing and occlusion of the right pulmonary artery superior trunk as well as narrowing of the interlobular right pulmonary artery and encasement and narrowing of the right upper lobe bronchus in addition to right hilar and subcarinal lymphadenopathy and suspicious lesion in the liver.  This was diagnosed in August 2022.  Molecular studies by [employer] showed no actionable mutations.  PRIOR THERAPY: None  CURRENT THERAPY: Concurrent chemoradiation with weekly carboplatin for AUC of 2 and paclitaxel 45 Mg/M2.  Status post 6 cycles.  Last dose was given on 03/02/2021.  INTERVAL HISTORY: Abigail Clark 45 y.o. female returns to the clinic today for follow-up visit.  The patient continues to complain of significant odynophagia and difficulty swallowing after the radiotherapy.  She was seen by Dr. Sondra Come earlier today and he is expecting to send her prescription for pain medication.  She is currently on Carafate and lidocaine with no significant improvement.  She has no chest pain, shortness of breath, cough or hemoptysis.  She denied having any fever or chills.  She has no nausea, vomiting, diarrhea or constipation.  She lost 2 pounds since her last visit.  She is here today for evaluation before starting cycle #7.   MEDICAL HISTORY: Past Medical History:  Diagnosis Date   Cataract    RIGHT EYE   Eczema    Exotropia of right eye 11/2014   Irritable bowel syndrome (IBS)    no current med.   Lactose intolerance     ALLERGIES:  is allergic to penicillins.  MEDICATIONS:  Current Outpatient Medications  Medication Sig Dispense Refill   cholecalciferol (VITAMIN D3) 25 MCG  (1000 UNIT) tablet Take 1,000 Units by mouth daily.     doxycycline (DORYX) 100 MG EC tablet Take 100 mg by mouth 2 (two) times daily. "For my face"     lidocaine (XYLOCAINE) 2 % solution Patient: Mix 1part 2% viscous lidocaine, 1part H20. Swallow 5mL of diluted mixture, 39min before meals and at bedtime, up to QID 200 mL 0   Multiple Vitamin (MULTIVITAMIN) tablet Take 1 tablet by mouth daily.     prochlorperazine (COMPAZINE) 10 MG tablet Take 1 tablet (10 mg total) by mouth every 6 (six) hours as needed for nausea or vomiting. 30 tablet 0   sucralfate (CARAFATE) 1 g tablet Take 1 tablet (1 g total) by mouth 4 (four) times daily -  with meals and at bedtime. Crush and dissolve in 10 mL of warm water prior to swallowing 120 tablet 1   tretinoin (RETIN-A) 0.025 % gel Apply topically.     UNABLE TO FIND Sea moss, burdock root, macca,     vitamin C (ASCORBIC ACID) 250 MG tablet Take 250 mg by mouth daily.     vitamin E 1000 UNIT capsule Take by mouth daily.     No current facility-administered medications for this visit.    SURGICAL HISTORY:  Past Surgical History:  Procedure Laterality Date   BRONCHIAL BIOPSY  01/12/2021   Procedure: BRONCHIAL BIOPSIES;  Surgeon: Spero Geralds, MD;  Location[facility] ENDOSCOPY;  Service: Pulmonary;;   BRONCHIAL NEEDLE ASPIRATION BIOPSY  01/12/2021   Procedure: BRONCHIAL NEEDLE ASPIRATION BIOPSIES;  Surgeon: Spero Geralds,  MD;  Location: [place];  Service: Pulmonary;;   CATARACT PEDIATRIC Right 1986   LEEP  05/20/2006   LIPOSUCTION  10/2014   abd. - local anes.   STRABISMUS SURGERY Right 11/29/2014   Procedure: REPAIR STRABISMUS RIGHT EYE ;  Surgeon: Everitt Amber, MD;  Location: [place];  Service: Ophthalmology;  Laterality: Right;   VIDEO BRONCHOSCOPY WITH ENDOBRONCHIAL ULTRASOUND N/A 01/12/2021   Procedure: VIDEO BRONCHOSCOPY WITH ENDOBRONCHIAL ULTRASOUND;  Surgeon: Spero Geralds, MD;  Location: [facility] ENDOSCOPY;  Service: Pulmonary;   Laterality: N/A;   WISDOM TOOTH EXTRACTION      REVIEW OF SYSTEMS:  A comprehensive review of systems was negative except for: Constitutional: positive for fatigue Gastrointestinal: positive for odynophagia   PHYSICAL EXAMINATION: General appearance: alert, cooperative, fatigued, and no distress Head: Normocephalic, without obvious abnormality, atraumatic Neck: no adenopathy, no JVD, supple, symmetrical, trachea midline, and thyroid not enlarged, symmetric, no tenderness/mass/nodules Lymph nodes: Cervical, supraclavicular, and axillary nodes normal. Resp: clear to auscultation bilaterally Back: symmetric, no curvature. ROM normal. No CVA tenderness. Cardio: regular rate and rhythm, S1, S2 normal, no murmur, click, rub or gallop GI: soft, non-tender; bowel sounds normal; no masses,  no organomegaly Extremities: extremities normal, atraumatic, no cyanosis or edema  ECOG PERFORMANCE STATUS: 1 - Symptomatic but completely ambulatory  Blood pressure 122/76, pulse (!) 115, temperature (!) 97.2 F (36.2 C), resp. rate 18, weight 187 lb 7 oz (85 kg), SpO2 98 %.  LABORATORY DATA: Lab Results  Component Value Date   WBC 4.3 03/09/2021   HGB 11.6 (L) 03/09/2021   HCT 33.7 (L) 03/09/2021   MCV 86.9 03/09/2021   PLT 187 03/09/2021      Chemistry      Component Value Date/Time   NA 138 03/09/2021 0904   K 3.7 03/09/2021 0904   CL 106 03/09/2021 0904   CO2 19 (L) 03/09/2021 0904   BUN 14 03/09/2021 0904   CREATININE 0.74 03/09/2021 0904      Component Value Date/Time   CALCIUM 9.8 03/09/2021 0904   ALKPHOS 87 03/09/2021 0904   AST 13 (L) 03/09/2021 0904   ALT 13 03/09/2021 0904   BILITOT 0.7 03/09/2021 0904       RADIOGRAPHIC STUDIES: No results found.  ASSESSMENT AND PLAN: This is a very pleasant 45 years old African-American female diagnosed with a stage IIIC (T4, N3, M0) non-small cell lung cancer, adenocarcinoma presented with large right upper lobe lung mass with  narrowing and occlusion of the right pulmonary artery superior trunk as well as narrowing of the interlobular right pulmonary artery and encasement and narrowing of the right upper lobe bronchus in addition to right hilar and mediastinal lymphadenopathy and suspicious liver lesion. The patient had molecular studies by [employer] that showed no actionable mutation. She had a PET scan performed last week and that showed no evidence for metastatic disease outside the chest but there was evidence of metastatic disease to the left supraclavicular area. The patient underwent a course of concurrent chemoradiation with weekly carboplatin for AUC of 2 and paclitaxel 45 Mg/M2.  Status post 6 cycles.  Last dose was given on 03/02/2021. She completed the last fraction of radiotherapy earlier today. I recommended for the patient to discontinue chemotherapy as of today. I will see her back for follow-up visit in 3 weeks for evaluation with repeat CT scan of the chest for restaging of her disease. For the odynophagia, she will continue her current treatment with Carafate and lidocaine  and Dr. Sondra Come will send her prescription for pain medication today. She was advised to call immediately if she has any other concerning symptoms in the interval. The patient voices understanding of current disease status and treatment options and is in agreement with the current care plan.  All questions were answered. The patient knows to call the clinic with any problems, questions or concerns. We can certainly see the patient much sooner if necessary.   Disclaimer: This note was dictated with voice recognition software. Similar sounding words can inadvertently be transcribed and may not be corrected upon review.

## 2021-03-12 ENCOUNTER — Emergency Department (HOSPITAL_COMMUNITY)
Admission: EM | Admit: 2021-03-12 | Discharge: 2021-03-12 | Disposition: A | Discharge status: Home / Self Care | Payer: 59 | Attending: Emergency Medicine | Admitting: Emergency Medicine

## 2021-03-12 ENCOUNTER — Emergency Department (HOSPITAL_COMMUNITY): Payer: 59

## 2021-03-12 ENCOUNTER — Encounter (HOSPITAL_COMMUNITY): Payer: Self-pay

## 2021-03-12 DIAGNOSIS — R1310 Dysphagia, unspecified: Secondary | ICD-10-CM | POA: Diagnosis not present

## 2021-03-12 DIAGNOSIS — E860 Dehydration: Secondary | ICD-10-CM

## 2021-03-12 DIAGNOSIS — F17210 Nicotine dependence, cigarettes, uncomplicated: Secondary | ICD-10-CM | POA: Insufficient documentation

## 2021-03-12 LAB — CBC WITH DIFFERENTIAL/PLATELET
Abs Immature Granulocytes: 0.01 10*3/uL (ref 0.00–0.07)
Basophils Absolute: 0 10*3/uL (ref 0.0–0.1)
Basophils Relative: 0 %
Eosinophils Absolute: 0.1 10*3/uL (ref 0.0–0.5)
Eosinophils Relative: 2 %
HCT: 34.6 % — ABNORMAL LOW (ref 36.0–46.0)
Hemoglobin: 11.7 g/dL — ABNORMAL LOW (ref 12.0–15.0)
Immature Granulocytes: 0 %
Lymphocytes Relative: 9 %
Lymphs Abs: 0.4 10*3/uL — ABNORMAL LOW (ref 0.7–4.0)
MCH: 30.2 pg (ref 26.0–34.0)
MCHC: 33.8 g/dL (ref 30.0–36.0)
MCV: 89.2 fL (ref 80.0–100.0)
Monocytes Absolute: 0.4 10*3/uL (ref 0.1–1.0)
Monocytes Relative: 10 %
Neutro Abs: 3.3 10*3/uL (ref 1.7–7.7)
Neutrophils Relative %: 79 %
Platelets: 213 10*3/uL (ref 150–400)
RBC: 3.88 MIL/uL (ref 3.87–5.11)
RDW: 16.6 % — ABNORMAL HIGH (ref 11.5–15.5)
WBC: 4.1 10*3/uL (ref 4.0–10.5)
nRBC: 0 % (ref 0.0–0.2)

## 2021-03-12 LAB — BASIC METABOLIC PANEL
Anion gap: 10 (ref 5–15)
BUN: 17 mg/dL (ref 6–20)
CO2: 19 mmol/L — ABNORMAL LOW (ref 22–32)
Calcium: 9.6 mg/dL (ref 8.9–10.3)
Chloride: 115 mmol/L — ABNORMAL HIGH (ref 98–111)
Creatinine, Ser: 0.9 mg/dL (ref 0.44–1.00)
GFR, Estimated: >60 mL/min (ref >60)
Glucose, Bld: 92 mg/dL (ref 70–99)
Potassium: 3.6 mmol/L (ref 3.5–5.1)
Sodium: 144 mmol/L (ref 135–145)

## 2021-03-12 MED ORDER — IOHEXOL 350 MG/ML SOLN
60.0000 mL | Freq: Once | INTRAVENOUS | Status: AC | PRN
  Administered 2021-03-12: 60 mL via INTRAVENOUS

## 2021-03-12 MED ORDER — LACTATED RINGERS IV SOLN: INTRAVENOUS | Status: DC

## 2021-03-12 MED ORDER — LACTATED RINGERS IV BOLUS
2000.0000 mL | Freq: Once | INTRAVENOUS | Status: AC
  Administered 2021-03-12: 2000 mL via INTRAVENOUS

## 2021-03-12 MED ORDER — MORPHINE SULFATE (PF) 4 MG/ML IV SOLN
4.0000 mg | Freq: Once | INTRAVENOUS | Status: AC
Start: 2021-03-12 — End: 2021-03-12
  Administered 2021-03-12: 4 mg via INTRAVENOUS
  Filled 2021-03-12: qty 1

## 2021-03-12 MED ORDER — MORPHINE SULFATE (PF) 4 MG/ML IV SOLN
6.0000 mg | Freq: Once | INTRAVENOUS | Status: AC
  Administered 2021-03-12: 6 mg via INTRAVENOUS
  Filled 2021-03-12: qty 2

## 2021-03-12 NOTE — ED Notes (Signed)
Pt stating she is still in pain and cannot leave. MD made [id number]

## 2021-03-12 NOTE — ED Provider Notes (Signed)
[facility] DEPT Provider Note   CSN: [id number] Arrival date & time: 03/12/21  0818     History Chief Complaint  Patient presents with   Sore Throat    Abigail Clark is a 45 y.o. female.  45 year old female with history of stage III lung cancer who just completed radiation therapy who presents with trouble swallowing time several days.  Patient states that she feels that something is stuck in her throat.  Denies any emesis or fever.  No prior history of same.  Denies any oral lesions that she could see.  She has not been short of breath.  Has not had any stridor.  States she has had increasing weakness.  No treatment use prior to arrival      Past Medical History:  Diagnosis Date   Cataract    RIGHT EYE   Eczema    Exotropia of right eye 11/2014   Irritable bowel syndrome (IBS)    no current med.   Lactose intolerance     Patient Active Problem List   Diagnosis Date Noted   Adenocarcinoma of right lung, stage 3 ([place]) 01/15/2021   Encounter for antineoplastic chemotherapy 01/15/2021   Lung mass 01/12/2021   Hilar adenopathy    Postcoital bleeding 01/24/2014   Mastodynia 12/26/2012   BV (bacterial vaginosis) 12/26/2012   Candidiasis of vulva and vagina 12/26/2012   LACTOSE INTOLERANCE 02/11/2009   IRRITABLE BOWEL SYNDROME 02/11/2009   ABDOMINAL PAIN, GENERALIZED 02/11/2009   CONSTIPATION 02/10/2009   OVARIAN CYST, LEFT 02/10/2009    Past Surgical History:  Procedure Laterality Date   BRONCHIAL BIOPSY  01/12/2021   Procedure: BRONCHIAL BIOPSIES;  Surgeon: Spero Geralds, MD;  Location[facility] ENDOSCOPY;  Service: Pulmonary;;   BRONCHIAL NEEDLE ASPIRATION BIOPSY  01/12/2021   Procedure: BRONCHIAL NEEDLE ASPIRATION BIOPSIES;  Surgeon: Spero Geralds, MD;  Location: [facility] ENDOSCOPY;  Service: Pulmonary;;   CATARACT PEDIATRIC Right 1986   LEEP  05/20/2006   LIPOSUCTION  10/2014   abd. - local anes.   STRABISMUS SURGERY Right 11/29/2014    Procedure: REPAIR STRABISMUS RIGHT EYE ;  Surgeon: Everitt Amber, MD;  Location: [place];  Service: Ophthalmology;  Laterality: Right;   VIDEO BRONCHOSCOPY WITH ENDOBRONCHIAL ULTRASOUND N/A 01/12/2021   Procedure: VIDEO BRONCHOSCOPY WITH ENDOBRONCHIAL ULTRASOUND;  Surgeon: Spero Geralds, MD;  Location: [facility] ENDOSCOPY;  Service: Pulmonary;  Laterality: N/A;   WISDOM TOOTH EXTRACTION       OB History     Gravida  4   Para  3   Term  1   Preterm  2   AB  1   Living  3      SAB      IAB      Ectopic      Multiple      Live Births  3           Family History  Problem Relation Age of Onset   Colon polyps Mother    Diabetes Father    Cancer Maternal Grandmother    Breast cancer Maternal Grandmother    Heart attack Maternal Grandfather    Cancer Paternal Grandfather    Colon cancer Neg Hx    Esophageal cancer Neg Hx    Stomach cancer Neg Hx    Rectal cancer Neg Hx     Social History   Tobacco Use   Smoking status: Every Day    Packs/day: 0.25    Years: 7.00  Pack years: 1.75    Types: Cigarettes   Smokeless tobacco: Never   Tobacco comments:    7 cig./day  Vaping Use   Vaping Use: Never used  Substance Use Topics   Alcohol use: No   Drug use: No    Home Medications Prior to Admission medications   Medication Sig Start Date End Date Taking? Authorizing Provider  cholecalciferol (VITAMIN D3) 25 MCG (1000 UNIT) tablet Take 1,000 Units by mouth daily.    [provider]  doxycycline (DORYX) 100 MG EC tablet Take 100 mg by mouth 2 (two) times daily. "For my face"    Macario Carls, MD  lidocaine (XYLOCAINE) 2 % solution Patient: Mix 1part 2% viscous lidocaine, 1part H20. Swallow 44mL of diluted mixture, 57min before meals and at bedtime, up to QID 02/17/21   Eppie Gibson, MD  Multiple Vitamin (MULTIVITAMIN) tablet Take 1 tablet by mouth daily.    [provider]  oxyCODONE-acetaminophen (PERCOCET/ROXICET) 5-325 MG  tablet Take 1 tablet by mouth every 4 (four) hours as needed for severe pain. 03/09/21   Gery Pray, MD  prochlorperazine (COMPAZINE) 10 MG tablet Take 1 tablet (10 mg total) by mouth every 6 (six) hours as needed for nausea or vomiting. 01/15/21   Curt Bears, MD  sucralfate (CARAFATE) 1 g tablet Take 1 tablet (1 g total) by mouth 4 (four) times daily -  with meals and at bedtime. Crush and dissolve in 10 mL of warm water prior to swallowing 02/09/21   Gery Pray, MD  tretinoin (RETIN-A) 0.025 % gel Apply topically. 01/22/21   [provider]  UNABLE TO FIND Sea moss, burdock root, macca,    [provider]  vitamin C (ASCORBIC ACID) 250 MG tablet Take 250 mg by mouth daily.    [provider]  vitamin E 1000 UNIT capsule Take by mouth daily.    [provider]    Allergies    Penicillins  Review of Systems   Review of Systems  All other systems reviewed and are negative.  Physical Exam Updated Vital Signs BP (!) 121/94   Pulse 97   Temp (!) 97.5 F (36.4 C) (Oral)   Resp 18   Ht 1.702 m (5\' 7" )   Wt 84.8 kg   LMP 02/12/2021 (Approximate)   SpO2 100%   BMI 29.29 kg/m   Physical Exam Vitals and nursing note reviewed.  Constitutional:      General: She is not in acute distress.    Appearance: Normal appearance. She is well-developed. She is not toxic-appearing.  HENT:     Head: Normocephalic and atraumatic.  Eyes:     General: Lids are normal.     Conjunctiva/sclera: Conjunctivae normal.     Pupils: Pupils are equal, round, and reactive to light.  Neck:     Thyroid: No thyroid mass.     Trachea: No tracheal deviation.  Cardiovascular:     Rate and Rhythm: Normal rate and regular rhythm.     Heart sounds: Normal heart sounds. No murmur heard.   No gallop.  Pulmonary:     Effort: Pulmonary effort is normal. No respiratory distress.     Breath sounds: Normal breath sounds. No stridor. No decreased breath sounds, wheezing, rhonchi  or rales.  Abdominal:     General: There is no distension.     Palpations: Abdomen is soft.     Tenderness: There is no abdominal tenderness. There is no rebound.  Musculoskeletal:  General: No tenderness. Normal range of motion.     Cervical back: Normal range of motion and neck supple.  Skin:    General: Skin is warm and dry.     Findings: No abrasion or rash.  Neurological:     Mental Status: She is alert and oriented to person, place, and time. Mental status is at baseline.     GCS: GCS eye subscore is 4. GCS verbal subscore is 5. GCS motor subscore is 6.     Cranial Nerves: Cranial nerves are intact. No cranial nerve deficit.     Sensory: No sensory deficit.     Motor: Motor function is intact.  Psychiatric:        Attention and Perception: Attention normal.        Speech: Speech normal.        Behavior: Behavior normal.    ED Results / Procedures / Treatments   Labs (all labs ordered are listed, but only abnormal results are displayed) Labs Reviewed  CBC WITH DIFFERENTIAL/PLATELET  BASIC METABOLIC PANEL    EKG None  Radiology No results found.  Procedures Procedures   Medications Ordered in ED Medications  lactated ringers bolus 2,000 mL (has no administration in time range)  lactated ringers infusion (has no administration in time range)    ED Course  I have reviewed the triage vital signs and the nursing notes.  Pertinent labs & imaging results that were available during my care of the patient were reviewed by me and considered in my medical decision making (see chart for details).    MDM Rules/Calculators/A&P                           Patient medicated with IV fluids here as well as pain medication and feels better.  Labs are reassuring.  CT of neck without evidence of obstruction.  She has no stridor.  Supraclavicular no appreciated but patient states that she is aware of this.  We will follow-up in the cancer center Final Clinical  Impression(s) / ED Diagnoses Final diagnoses:  None    Rx / DC Orders ED Discharge Orders     None        Lacretia Leigh, MD 03/12/21 1245

## 2021-03-12 NOTE — ED Triage Notes (Signed)
Pt BIBA from home. Pt has hx of lung CA. Pt last had radiation 5 days ago. Since then, pt has increased throat pain, states she has not been able to eat or drink, has been spitting. Pt states she has been nauseated, denies vomitting. Pt states minimal relief from hydrocodone.   Received 500 NS en route, VSS with EMS. FSBG 116.

## 2021-03-23 ENCOUNTER — Encounter: Payer: Self-pay | Admitting: Radiation Oncology

## 2021-03-24 ENCOUNTER — Other Ambulatory Visit: Payer: Self-pay | Admitting: Radiation Oncology

## 2021-03-24 ENCOUNTER — Telehealth: Payer: Self-pay | Admitting: Radiology

## 2021-03-24 DIAGNOSIS — C3491 Malignant neoplasm of unspecified part of right bronchus or lung: Secondary | ICD-10-CM

## 2021-03-24 MED ORDER — OXYCODONE-ACETAMINOPHEN 5-325 MG PO TABS: 1.0000 | ORAL_TABLET | ORAL | 0 refills | Status: DC | PRN

## 2021-03-24 MED ORDER — LIDOCAINE VISCOUS HCL 2 % MT SOLN: 10.0000 mL | Freq: Four times a day (QID) | OROMUCOSAL | 1 refills | Status: DC | PRN

## 2021-03-24 NOTE — Telephone Encounter (Signed)
Patient reports being unable to eat due to esophageal inflammation. States carafate was not helpful in decreasing discomfort. Requests a refill of oxycodone and lidocaine solution as well as any other recommendations.

## 2021-03-25 ENCOUNTER — Encounter: Payer: Self-pay | Admitting: Internal Medicine

## 2021-03-25 NOTE — Telephone Encounter (Signed)
Notified patient of scripts sent to pharmacy.

## 2021-03-27 ENCOUNTER — Inpatient Hospital Stay: Payer: 59 | Attending: Internal Medicine

## 2021-03-27 ENCOUNTER — Telehealth: Payer: Self-pay | Admitting: Medical Oncology

## 2021-03-27 ENCOUNTER — Ambulatory Visit (HOSPITAL_COMMUNITY)
Admission: RE | Admit: 2021-03-27 | Discharge: 2021-03-27 | Disposition: A | Discharge status: Home / Self Care | Payer: 59 | Source: Ambulatory Visit | Attending: Internal Medicine | Admitting: Internal Medicine

## 2021-03-27 ENCOUNTER — Other Ambulatory Visit: Payer: Self-pay

## 2021-03-27 ENCOUNTER — Telehealth: Payer: Self-pay | Admitting: *Deleted

## 2021-03-27 DIAGNOSIS — Z923 Personal history of irradiation: Secondary | ICD-10-CM | POA: Insufficient documentation

## 2021-03-27 DIAGNOSIS — C3490 Malignant neoplasm of unspecified part of unspecified bronchus or lung: Secondary | ICD-10-CM

## 2021-03-27 DIAGNOSIS — Z79899 Other long term (current) drug therapy: Secondary | ICD-10-CM | POA: Insufficient documentation

## 2021-03-27 DIAGNOSIS — C3491 Malignant neoplasm of unspecified part of right bronchus or lung: Secondary | ICD-10-CM

## 2021-03-27 DIAGNOSIS — E876 Hypokalemia: Secondary | ICD-10-CM

## 2021-03-27 DIAGNOSIS — C3411 Malignant neoplasm of upper lobe, right bronchus or lung: Secondary | ICD-10-CM | POA: Insufficient documentation

## 2021-03-27 DIAGNOSIS — Z5112 Encounter for antineoplastic immunotherapy: Secondary | ICD-10-CM | POA: Insufficient documentation

## 2021-03-27 LAB — CBC WITH DIFFERENTIAL (CANCER CENTER ONLY)
Abs Immature Granulocytes: 0.01 10*3/uL (ref 0.00–0.07)
Basophils Absolute: 0 10*3/uL (ref 0.0–0.1)
Basophils Relative: 1 %
Eosinophils Absolute: 0.1 10*3/uL (ref 0.0–0.5)
Eosinophils Relative: 3 %
HCT: 31.7 % — ABNORMAL LOW (ref 36.0–46.0)
Hemoglobin: 11.2 g/dL — ABNORMAL LOW (ref 12.0–15.0)
Immature Granulocytes: 0 %
Lymphocytes Relative: 21 %
Lymphs Abs: 0.9 10*3/uL (ref 0.7–4.0)
MCH: 29.9 pg (ref 26.0–34.0)
MCHC: 35.3 g/dL (ref 30.0–36.0)
MCV: 84.5 fL (ref 80.0–100.0)
Monocytes Absolute: 0.6 10*3/uL (ref 0.1–1.0)
Monocytes Relative: 13 %
Neutro Abs: 2.7 10*3/uL (ref 1.7–7.7)
Neutrophils Relative %: 62 %
Platelet Count: 281 10*3/uL (ref 150–400)
RBC: 3.75 MIL/uL — ABNORMAL LOW (ref 3.87–5.11)
RDW: 18.6 % — ABNORMAL HIGH (ref 11.5–15.5)
WBC Count: 4.3 10*3/uL (ref 4.0–10.5)
nRBC: 0 % (ref 0.0–0.2)

## 2021-03-27 LAB — CMP (CANCER CENTER ONLY)
ALT: 10 U/L (ref 0–44)
AST: 15 U/L (ref 15–41)
Albumin: 3.8 g/dL (ref 3.5–5.0)
Alkaline Phosphatase: 82 U/L (ref 38–126)
Anion gap: 15 (ref 5–15)
BUN: <4 mg/dL — ABNORMAL LOW (ref 6–20)
CO2: 18 mmol/L — ABNORMAL LOW (ref 22–32)
Calcium: 9.7 mg/dL (ref 8.9–10.3)
Chloride: 104 mmol/L (ref 98–111)
Creatinine: 1.07 mg/dL — ABNORMAL HIGH (ref 0.44–1.00)
GFR, Estimated: >60 mL/min (ref >60)
Glucose, Bld: 94 mg/dL (ref 70–99)
Potassium: 2.7 mmol/L — CL (ref 3.5–5.1)
Sodium: 137 mmol/L (ref 135–145)
Total Bilirubin: 0.5 mg/dL (ref 0.3–1.2)
Total Protein: 7.7 g/dL (ref 6.5–8.1)

## 2021-03-27 MED ORDER — IOHEXOL 350 MG/ML SOLN
75.0000 mL | Freq: Once | INTRAVENOUS | Status: AC | PRN
  Administered 2021-03-27: 60 mL via INTRAVENOUS

## 2021-03-27 MED ORDER — POTASSIUM CHLORIDE CRYS ER 20 MEQ PO TBCR: 40.0000 meq | EXTENDED_RELEASE_TABLET | Freq: Every day | ORAL | 0 refills | Status: DC

## 2021-03-27 NOTE — Telephone Encounter (Signed)
CRITICAL VALUE STICKER  CRITICAL VALUE:K + 2.7  RECEIVER (on-site recipient of call):Abigail Clark  DATE & TIME NOTIFIED: 03/27/21@1700   MESSENGER (representative from lab):lab  MD NOTIFIED: Dr.Kale  TIME OF NOTIFICATION:1710  RESPONSE:   Dr. Irene Limbo ordered Kdur 40 meq daily x 5 days and add in K+ rich food to diet.  LVM on [name] / and pts phone  to pick up prescription.  Examples of K+ rich foods left on VM.

## 2021-03-27 NOTE — Telephone Encounter (Signed)
Critical value K 2.7, Abelina Bachelor, RN with Dr Julien Nordmann notified [phone].

## 2021-03-30 ENCOUNTER — Other Ambulatory Visit: Payer: Self-pay

## 2021-03-30 ENCOUNTER — Inpatient Hospital Stay (HOSPITAL_BASED_OUTPATIENT_CLINIC_OR_DEPARTMENT_OTHER): Payer: 59 | Admitting: Internal Medicine

## 2021-03-30 VITALS — BP 99/72 | HR 98 | Temp 97.6°F | Resp 18 | Ht 67.0 in | Wt 183.3 lb

## 2021-03-30 DIAGNOSIS — Z5112 Encounter for antineoplastic immunotherapy: Secondary | ICD-10-CM

## 2021-03-30 DIAGNOSIS — C3491 Malignant neoplasm of unspecified part of right bronchus or lung: Secondary | ICD-10-CM

## 2021-03-30 DIAGNOSIS — R591 Generalized enlarged lymph nodes: Secondary | ICD-10-CM

## 2021-03-30 DIAGNOSIS — C3411 Malignant neoplasm of upper lobe, right bronchus or lung: Secondary | ICD-10-CM

## 2021-03-30 DIAGNOSIS — Z79899 Other long term (current) drug therapy: Secondary | ICD-10-CM | POA: Diagnosis not present

## 2021-03-30 DIAGNOSIS — Z923 Personal history of irradiation: Secondary | ICD-10-CM | POA: Diagnosis not present

## 2021-03-30 NOTE — Progress Notes (Signed)
[facility] [phone]   Fax:([phone]  OFFICE PROGRESS NOTE  Koirala, Dibas, MD [address] [place] [address]  DIAGNOSIS: stage IIIC (T4, N3, M0) non-small cell lung cancer, adenocarcinoma presented with large right upper lobe lung mass with associated narrowing and occlusion of the right pulmonary artery superior trunk as well as narrowing of the interlobular right pulmonary artery and encasement and narrowing of the right upper lobe bronchus in addition to right hilar and subcarinal lymphadenopathy and suspicious lesion in the liver.  This was diagnosed in August 2022.  Molecular studies by [employer] showed no actionable mutations.  PRIOR THERAPY: Concurrent chemoradiation with weekly carboplatin for AUC of 2 and paclitaxel 45 Mg/M2.  Status post 6 cycles.  Last dose was given on 03/02/2021.  CURRENT THERAPY: Consolidation treatment with immunotherapy with Imfinzi 1500 Mg IV every 4 weeks.  First dose April 06, 2021  INTERVAL HISTORY: Abigail Clark 45 y.o. female returns to the clinic today for follow-up visit.  The patient is feeling fine today with no concerning complaints except for mild persistent odynophagia.  She has significant improvement in her condition especially the shortness of breath and also improvement in the dysphagia and odynophagia.  She denied having any current chest pain, cough or hemoptysis.  She denied having any nausea, vomiting, diarrhea or constipation.  She denied having any headache or visual changes.  She started gaining weight again.  The patient had repeat CT scan of the chest performed few days ago and she is here for evaluation and discussion of her scan results and treatment options.   MEDICAL HISTORY: Past Medical History:  Diagnosis Date   Cataract    RIGHT EYE   Eczema    Exotropia of right eye 11/2014   History of radiation therapy    Right Lung- 01/26/21-03/09/21- Dr. Gery Pray   Irritable bowel syndrome (IBS)    no current med.   Lactose intolerance     ALLERGIES:  is allergic to penicillins.  MEDICATIONS:  Current Outpatient Medications  Medication Sig Dispense Refill   cholecalciferol (VITAMIN D3) 25 MCG (1000 UNIT) tablet Take 1,000 Units by mouth daily.     lidocaine (XYLOCAINE) 2 % solution Use as directed 10 mLs in the mouth or throat 4 (four) times daily as needed for mouth pain. Patient: Mix 1part 2% viscous lidocaine, 1part H20 200 mL 1   Multiple Vitamin (MULTIVITAMIN) tablet Take 1 tablet by mouth daily.     omeprazole (PRILOSEC) 20 MG capsule Take 20 mg by mouth 2 (two) times daily as needed (indigestion).     oxyCODONE-acetaminophen (PERCOCET/ROXICET) 5-325 MG tablet Take 1 tablet by mouth every 4 (four) hours as needed for severe pain. 30 tablet 0   potassium chloride SA (KLOR-CON) 20 MEQ tablet Take 2 tablets (40 mEq total) by mouth daily for 5 days. 10 tablet 0   prochlorperazine (COMPAZINE) 10 MG tablet Take 1 tablet (10 mg total) by mouth every 6 (six) hours as needed for nausea or vomiting. 30 tablet 0   sucralfate (CARAFATE) 1 g tablet Take 1 tablet (1 g total) by mouth 4 (four) times daily -  with meals and at bedtime. Crush and dissolve in 10 mL of warm water prior to swallowing 120 tablet 1   UNABLE TO FIND Sea moss, burdock root, macca,     vitamin C (ASCORBIC ACID) 250 MG tablet Take 250 mg by mouth daily.  vitamin E 1000 UNIT capsule Take 1,000 Units by mouth daily.     No current facility-administered medications for this visit.    SURGICAL HISTORY:  Past Surgical History:  Procedure Laterality Date   BRONCHIAL BIOPSY  01/12/2021   Procedure: BRONCHIAL BIOPSIES;  Surgeon: Spero Geralds, MD;  Location: [facility] ENDOSCOPY;  Service: Pulmonary;;   BRONCHIAL NEEDLE ASPIRATION BIOPSY  01/12/2021   Procedure: BRONCHIAL NEEDLE ASPIRATION BIOPSIES;  Surgeon: Spero Geralds, MD;  Location: [facility] ENDOSCOPY;  Service: Pulmonary;;   CATARACT  PEDIATRIC Right 1986   LEEP  05/20/2006   LIPOSUCTION  10/2014   abd. - local anes.   STRABISMUS SURGERY Right 11/29/2014   Procedure: REPAIR STRABISMUS RIGHT EYE ;  Surgeon: Everitt Amber, MD;  Location: [place];  Service: Ophthalmology;  Laterality: Right;   VIDEO BRONCHOSCOPY WITH ENDOBRONCHIAL ULTRASOUND N/A 01/12/2021   Procedure: VIDEO BRONCHOSCOPY WITH ENDOBRONCHIAL ULTRASOUND;  Surgeon: Spero Geralds, MD;  Location: [facility] ENDOSCOPY;  Service: Pulmonary;  Laterality: N/A;   WISDOM TOOTH EXTRACTION      REVIEW OF SYSTEMS:  Constitutional: positive for fatigue Eyes: negative Ears, nose, mouth, throat, and face: negative Respiratory: positive for dyspnea on exertion Cardiovascular: negative Gastrointestinal: positive for odynophagia Genitourinary:negative Integument/breast: negative Hematologic/lymphatic: negative Musculoskeletal:negative Neurological: negative Behavioral/Psych: negative Endocrine: negative Allergic/Immunologic: negative   PHYSICAL EXAMINATION: General appearance: alert, cooperative, fatigued, and no distress Head: Normocephalic, without obvious abnormality, atraumatic Neck: no adenopathy, no JVD, supple, symmetrical, trachea midline, and thyroid not enlarged, symmetric, no tenderness/mass/nodules Lymph nodes: Cervical, supraclavicular, and axillary nodes normal. Resp: clear to auscultation bilaterally Back: symmetric, no curvature. ROM normal. No CVA tenderness. Cardio: regular rate and rhythm, S1, S2 normal, no murmur, click, rub or gallop GI: soft, non-tender; bowel sounds normal; no masses,  no organomegaly Extremities: extremities normal, atraumatic, no cyanosis or edema Neurologic: Alert and oriented X 3, normal strength and tone. Normal symmetric reflexes. Normal coordination and gait  ECOG PERFORMANCE STATUS: 1 - Symptomatic but completely ambulatory  Blood pressure 99/72, pulse 98, temperature 97.6 F (36.4 C), temperature source  Tympanic, resp. rate 18, height 5\' 7"  (1.702 m), weight 183 lb 4.8 oz (83.1 kg), SpO2 100 %.  LABORATORY DATA: Lab Results  Component Value Date   WBC 4.3 03/27/2021   HGB 11.2 (L) 03/27/2021   HCT 31.7 (L) 03/27/2021   MCV 84.5 03/27/2021   PLT 281 03/27/2021      Chemistry      Component Value Date/Time   NA 137 03/27/2021 1559   K 2.7 (LL) 03/27/2021 1559   CL 104 03/27/2021 1559   CO2 18 (L) 03/27/2021 1559   BUN <4 (L) 03/27/2021 1559   CREATININE 1.07 (H) 03/27/2021 1559      Component Value Date/Time   CALCIUM 9.7 03/27/2021 1559   ALKPHOS 82 03/27/2021 1559   AST 15 03/27/2021 1559   ALT 10 03/27/2021 1559   BILITOT 0.5 03/27/2021 1559       RADIOGRAPHIC STUDIES: CT Soft Tissue Neck W Contrast  Result Date: 03/12/2021 CLINICAL DATA:  Laryngeal edema. Throat pain. History of lung cancer with radiation. Last radiation treatment 5 days ago. EXAM: CT NECK WITH CONTRAST TECHNIQUE: Multidetector CT imaging of the neck was performed using the standard protocol following the bolus administration of intravenous contrast. CONTRAST:  53mL OMNIPAQUE IOHEXOL 350 MG/ML SOLN COMPARISON:  CT chest 01/11/2021 FINDINGS: Pharynx and larynx: Normal. No mass or swelling. Negative for laryngeal edema. Normal airway. Salivary glands: No inflammation, mass, or  stone. Thyroid: Multiple thyroid nodules. Right lower pole thyroid nodule 14.7 mm. 11.6 mm left upper pole nodule. 1 cm left midpole nodule. 12 mm left lower pole nodule. No further imaging necessary for thyroid nodules due to size. Lymph nodes: Left supraclavicular lymph node 18 mm, cystic. No change from prior PET-CT. Otherwise no enlarged lymph nodes in the neck. Vascular: Normal vascular enhancement. Negative Limited intracranial: Negative Visualized orbits: Negative Mastoids and visualized paranasal sinuses: Negative Skeleton: Negative for fracture or mass lesion. Upper chest: Mass lesion right upper lobe has improved since the prior  study. Mass is now cavitary. Right paratracheal adenopathy also with improvement due to treatment. Advanced apical emphysema. Other: None IMPRESSION: Negative for laryngeal edema.  No airway compromise. Cystic supraclavicular lymph node on the left 18 mm in diameter, concerning for metastatic disease. Cluster of hypermetabolic lymph nodes were seen in this area on the recent CT PET of 02/02/2021. The largest lymph node appears unchanged in size. Right upper lobe mass lesion has improved in size since the prior chest CT. Improvement in right paratracheal adenopathy. Electronically Signed   By: Franchot Gallo M.D.   On: 03/12/2021 10:47    ASSESSMENT AND PLAN: This is a very pleasant 45 years old African-American female diagnosed with a stage IIIC (T4, N3, M0) non-small cell lung cancer, adenocarcinoma presented with large right upper lobe lung mass with narrowing and occlusion of the right pulmonary artery superior trunk as well as narrowing of the interlobular right pulmonary artery and encasement and narrowing of the right upper lobe bronchus in addition to right hilar and mediastinal lymphadenopathy and suspicious liver lesion. The patient had molecular studies by [employer] that showed no actionable mutation. She had a PET scan performed last week and that showed no evidence for metastatic disease outside the chest but there was evidence of metastatic disease to the left supraclavicular area. The patient underwent a course of concurrent chemoradiation with weekly carboplatin for AUC of 2 and paclitaxel 45 Mg/M2.  Status post 6 cycles.  Last dose was given on 03/02/2021. The patient has a rough time with the course of the radiotherapy with increasing fatigue as well as dysphagia and odynophagia which is currently improving. She had repeat CT scan of the chest performed few days ago but unfortunately the final report is still pending. I personally and independently reviewed the scan images and discussed  the results with the patient today. Her scan showed significant decrease in the size of the right lung mass.  I am still waiting for the final report for confirmation of any other changes. I discussed with the patient her treatment options and recommended for her treatment with consolidation treatment with immunotherapy with Imfinzi 1500 Mg IV every 4 weeks. I discussed with the patient the adverse effect of this treatment including but not limited to immunotherapy mediated skin rash, diarrhea, inflammation of the lung, kidney, liver, thyroid or other endocrine dysfunction. The patient will come back for follow-up visit in 5 weeks with the start of cycle #2. If the pending scan showed any concerning finding for disease progression, I will call the patient with the change in plan. She was advised to call immediately if she has any other concerning symptoms in the interval. The patient voices understanding of current disease status and treatment options and is in agreement with the current care plan.  All questions were answered. The patient knows to call the clinic with any problems, questions or concerns. We can certainly see the patient  much sooner if necessary.   Disclaimer: This note was dictated with voice recognition software. Similar sounding words can inadvertently be transcribed and may not be corrected upon review.

## 2021-03-30 NOTE — Progress Notes (Signed)
DISCONTINUE ON PATHWAY REGIMEN - Non-Small Cell Lung     Administer weekly:     Paclitaxel      Carboplatin   **Always confirm dose/schedule in your pharmacy ordering system**  REASON: Continuation Of Treatment PRIOR TREATMENT: [id number]: Carboplatin AUC=2 + Paclitaxel 45 mg/m2 Weekly During Radiation TREATMENT RESPONSE: Partial Response (PR)  START ON PATHWAY REGIMEN - Non-Small Cell Lung     A cycle is every 28 days:     Durvalumab   **Always confirm dose/schedule in your pharmacy ordering system**  Patient Characteristics: Preoperative or Nonsurgical Candidate (Clinical Staging), Stage III - Nonsurgical Candidate (Nonsquamous and Squamous), PS = 0, 1 Therapeutic Status: Preoperative or Nonsurgical Candidate (Clinical Staging) AJCC T Category: cT4 AJCC N Category: cN2 AJCC M Category: cM0 AJCC 8 Stage Grouping: IIIB ECOG Performance Status: 1 Intent of Therapy: Curative Intent, Discussed with Patient

## 2021-03-31 ENCOUNTER — Telehealth: Payer: Self-pay | Admitting: Internal Medicine

## 2021-03-31 NOTE — Telephone Encounter (Signed)
Scheduled follow-up appointments per 10/17 los. Patient is aware. 

## 2021-04-01 NOTE — Progress Notes (Signed)
Radiation Oncology         [phone] ________________________________  Name: Abigail Clark MRN: [id number]  Date: 04/02/2021  DOB: 12-08-1975  Follow-Up Visit Note  CC: Lujean Amel, MD  Lujean Amel, MD    ICD-10-CM   1. Adenocarcinoma of right lung, stage 3 (HCC)  C34.91 lidocaine (XYLOCAINE) 2 % solution      Diagnosis: The encounter diagnosis was Adenocarcinoma of right lung, stage 3 ([place]).   Right lung mass (patho. from 08/01 revealed rare malignant cells consistent with non-small cell carcinoma-adenocarcinoma)  Interval Since Last Radiation: 24 days  Intent: Curative  Radiation Treatment Dates: 01/26/2021 through 03/09/2021 Site Technique Total Dose (Gy) Dose per Fx (Gy) Completed Fx Beam Energies  Lung, Right: Lung_Rt 3D 60/60 2 30/30 6X, 10X    Narrative:  The patient returns today for routine follow-up. The patient unfortunately did not tolerate radiation therapy well. On the day of her final treatment, she reported severe pain, rated at a 10/10 to her chest and back, and a moderate level of fatigue. She also reported a dry cough and shortness of breath and inability to eat anything that past weekend due to choking on foods and difficulty swallowing. Due to the patient's significant esophageal symptoms, she was placed on Percocet. She was also advised to present to medical oncology for blood work and assessment.   Accordingly, the patient presented to Dr. Julien Nordmann on the day of her final treatment prior to starting cycle 7 of chemo. During which time, Dr. Julien Nordmann discontinued the patients chemotherapy treatment based on her symptoms.   Soon after, the patient presented to the [facility] ED on 03/12/21 with continued trouble swallowing and increasing weakness. Patient stated that she felt as though something was stuck in her throat. The patient was treated for dehydration with IV fluids and given pain medication with resolution of symptoms. CT of the neck performed  showed no evidence of obstruction.   Chest CT performed on 03/27/21 demonstrated a significant interval decrease in size as well as cavitation of the large right upper lobe mass, measuring 8.6 x 6.2 cm, previously 10.4 x 9.6 cm. An interval decrease is size was also seen of the enlarged pretracheal and right hilar lymph nodes. Overall, findings were consistent with treatment response.   The patient again followed up with Dr. Julien Nordmann on 03/30/21. The patient reported mild persistent odynophagia at this time though reported significant improvement in her condition especially the shortness of breath and also improvement in the dysphagia and odynophagia.  She denied having any current chest pain, cough or hemoptysis.  She denied having any nausea, vomiting, diarrhea or constipation.   Other pertinent imaging since the patient was seen for her initial consultation includes:  --MRI of the brain with and without contrast on 01/27/21 which demonstrated no evidence of metastatic disease in the brain. --PET on 02/02/21 which demonstrated the then intensely hypermetabolic RUL mass as compatible with primary bronchogenic carcinoma, and compatible with at least T4N3M0 or stage IIIc diease. The FDG avid right paratracheal, subcarinal, and left supraclavicular nodal metastases were appreciated as well.  Allergies:  is allergic to penicillins.  Meds: Current Outpatient Medications  Medication Sig Dispense Refill   omeprazole (PRILOSEC) 20 MG capsule Take 20 mg by mouth 2 (two) times daily as needed (indigestion).     cholecalciferol (VITAMIN D3) 25 MCG (1000 UNIT) tablet Take 1,000 Units by mouth daily. (Patient not taking: Reported on 04/02/2021)     lidocaine (XYLOCAINE) 2 % solution  Use as directed 10 mLs in the mouth or throat 4 (four) times daily as needed for mouth pain. Patient: Mix 1part 2% viscous lidocaine, 1part H20 200 mL 1   Multiple Vitamin (MULTIVITAMIN) tablet Take 1 tablet by mouth daily. (Patient  not taking: Reported on 04/02/2021)     oxyCODONE-acetaminophen (PERCOCET/ROXICET) 5-325 MG tablet Take 1 tablet by mouth every 4 (four) hours as needed for severe pain. (Patient not taking: Reported on 04/02/2021) 30 tablet 0   potassium chloride SA (KLOR-CON) 20 MEQ tablet Take 2 tablets (40 mEq total) by mouth daily for 5 days. 10 tablet 0   prochlorperazine (COMPAZINE) 10 MG tablet Take 1 tablet (10 mg total) by mouth every 6 (six) hours as needed for nausea or vomiting. (Patient not taking: Reported on 04/02/2021) 30 tablet 0   sucralfate (CARAFATE) 1 g tablet Take 1 tablet (1 g total) by mouth 4 (four) times daily -  with meals and at bedtime. Crush and dissolve in 10 mL of warm water prior to swallowing (Patient not taking: Reported on 04/02/2021) 120 tablet 1   UNABLE TO FIND Sea moss, burdock root, macca, (Patient not taking: Reported on 04/02/2021)     vitamin C (ASCORBIC ACID) 250 MG tablet Take 250 mg by mouth daily. (Patient not taking: Reported on 04/02/2021)     vitamin E 1000 UNIT capsule Take 1,000 Units by mouth daily. (Patient not taking: Reported on 04/02/2021)     No current facility-administered medications for this encounter.    Physical Findings: The patient is in no acute distress. Patient is alert and oriented.  height is 5\' 7"  (1.702 m) and weight is 181 lb 3.2 oz (82.2 kg). Her temperature is 97.8 F (36.6 C). Her blood pressure is 113/77 and her pulse is 92. Her respiration is 20 and oxygen saturation is 100%. .   Lungs are clear to auscultation bilaterally. Heart has regular rate and rhythm. No palpable cervical, supraclavicular, or axillary adenopathy. Abdomen soft, non-tender, normal bowel sounds.    Lab Findings: Lab Results  Component Value Date   WBC 4.3 03/27/2021   HGB 11.2 (L) 03/27/2021   HCT 31.7 (L) 03/27/2021   MCV 84.5 03/27/2021   PLT 281 03/27/2021    Radiographic Findings: CT Soft Tissue Neck W Contrast  Result Date: 03/12/2021 CLINICAL  DATA:  Laryngeal edema. Throat pain. History of lung cancer with radiation. Last radiation treatment 5 days ago. EXAM: CT NECK WITH CONTRAST TECHNIQUE: Multidetector CT imaging of the neck was performed using the standard protocol following the bolus administration of intravenous contrast. CONTRAST:  80mL OMNIPAQUE IOHEXOL 350 MG/ML SOLN COMPARISON:  CT chest 01/11/2021 FINDINGS: Pharynx and larynx: Normal. No mass or swelling. Negative for laryngeal edema. Normal airway. Salivary glands: No inflammation, mass, or stone. Thyroid: Multiple thyroid nodules. Right lower pole thyroid nodule 14.7 mm. 11.6 mm left upper pole nodule. 1 cm left midpole nodule. 12 mm left lower pole nodule. No further imaging necessary for thyroid nodules due to size. Lymph nodes: Left supraclavicular lymph node 18 mm, cystic. No change from prior PET-CT. Otherwise no enlarged lymph nodes in the neck. Vascular: Normal vascular enhancement. Negative Limited intracranial: Negative Visualized orbits: Negative Mastoids and visualized paranasal sinuses: Negative Skeleton: Negative for fracture or mass lesion. Upper chest: Mass lesion right upper lobe has improved since the prior study. Mass is now cavitary. Right paratracheal adenopathy also with improvement due to treatment. Advanced apical emphysema. Other: None IMPRESSION: Negative for laryngeal edema.  No airway  compromise. Cystic supraclavicular lymph node on the left 18 mm in diameter, concerning for metastatic disease. Cluster of hypermetabolic lymph nodes were seen in this area on the recent CT PET of 02/02/2021. The largest lymph node appears unchanged in size. Right upper lobe mass lesion has improved in size since the prior chest CT. Improvement in right paratracheal adenopathy. Electronically Signed   By: Franchot Gallo M.D.   On: 03/12/2021 10:47   CT Chest W Contrast  Result Date: 03/30/2021 CLINICAL DATA:  Non-small cell lung cancer staging, status post chemotherapy and XRT  EXAM: CT CHEST WITH CONTRAST TECHNIQUE: Multidetector CT imaging of the chest was performed during intravenous contrast administration. CONTRAST:  14mL OMNIPAQUE IOHEXOL 350 MG/ML SOLN COMPARISON:  PET-CT, 02/02/2021, CT chest, 01/12/2021 FINDINGS: Cardiovascular: No significant vascular findings. 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 Normal heart size. No pericardial effusion. Mediastinum/Nodes: Interval decrease in size of enlarged pretracheal and right hilar lymph nodes, which remain bulky in the right hilum, measuring approximately 3.3 x 2.8 cm, previously 4.3 x 3.4 cm (series 2, image 63). Thyroid gland, trachea, and esophagus demonstrate no significant findings. Lungs/Pleura: Moderate centrilobular emphysema. Diffuse bilateral bronchial wall thickening. Significant interval decrease in size as well as cavitation of a large right upper lobe mass, measuring 8.6 x 6.2 cm, previously 10.4 x 9.6 cm (series 5, image 50). No pleural effusion or pneumothorax. Upper Abdomen: No acute abnormality. Musculoskeletal: No chest wall mass or suspicious bone lesions identified. IMPRESSION: 1. Significant interval decrease in size as well as cavitation of a large right upper lobe mass, measuring 8.6 x 6.2 cm, previously 10.4 x 9.6 cm. 2. Interval decrease in size of enlarged pretracheal and right hilar lymph nodes. 3. Findings are consistent with treatment response of primary lung malignancy and nodal metastatic disease. 4. Emphysema and diffuse bilateral bronchial wall thickening. Electronically Signed   By: Delanna Ahmadi M.D.   On: 03/30/2021 16:09    Impression:  The encounter diagnosis was Adenocarcinoma of right lung, stage 3 ([place]).   Right lung mass (patho. from 08/01 revealed rare malignant cells consistent with non-small cell carcinoma-adenocarcinoma)  The patient is recovering from the effects of radiation.  She is feeling much better at this time.  Her breathing is  improved.  she has much better energy level.  She continues to have some esophageal symptoms but is taking her Percocet very little at this time.  She requests a refill on the lidocaine which I have given to her  Plan: As needed follow-up in radiation oncology.  Patient will now proceed with immunotherapy.    ____________________________________  Blair Promise, PhD, MD   This document serves as a record of services personally performed by Gery Pray, MD. It was created on his behalf by Roney Mans, a trained medical scribe. The creation of this record is based on the scribe's personal observations and the provider's statements to them. This document has been checked and approved by the attending provider.

## 2021-04-01 NOTE — Progress Notes (Incomplete)
  Radiation Oncology         [phone]) [phone] ________________________________  Patient Name: Abigail Clark MRN: [id number] DOB: 06/04/1976 Referring Physician: Lujean Amel (Profile Not Attached) Date of Service: 03/09/2021 [facility] [place], [place]  End Of Treatment Note  Diagnoses: C34.11-Malignant neoplasm of upper lobe, right bronchus or lung  Cancer Staging:  The encounter diagnosis was Adenocarcinoma of right lung, stage 3 ([place]).   Right lung mass (patho. from 08/01 revealed rare malignant cells consistent with non-small cell carcinoma-adenocarcinoma)  Intent: Curative  Radiation Treatment Dates: 01/26/2021 through 03/09/2021 Site Technique Total Dose (Gy) Dose per Fx (Gy) Completed Fx Beam Energies  Lung, Right: Lung_Rt 3D 60/60 2 30/30 6X, 10X   Narrative: The patient unfortunately did not tolerate radiation therapy well. She reports having severe pain, rated at a 10/10 to chest and back, and a moderate level of fatigue. She continues to have a dry cough and shortness of breath and states she has not been able to eat anything all weekend due to choking on foods and difficulty swallowing.   Patient states Carfate and lidocaine is no longer effective. Noted patient to have a 8.8lb weight loss in 1 week. Noted bp to be 90/70. Patient also reports feeling dizzy.  Due to the patient's significant esophageal symptoms, she will be placed on Percocet.  She does report that she is able to swallow pills at this time.  She will present to medical oncology for blood work and assessment and possibly require IV fluids.  Doubt she will be able to have radiosensitizing chemotherapy today. Discussed with her that she may need additional IV fluid supplementation later this week or next week given her significant difficulties with swallowing.  Hopefully pain medication as above will be helpful for her.  Plan: The patient will follow-up with radiation oncology in one month  .  ________________________________________________ -----------------------------------  Blair Promise, PhD, MD  This document serves as a record of services personally performed by Gery Pray, MD. It was created on his behalf by Roney Mans, a trained medical scribe. The creation of this record is based on the scribe's personal observations and the provider's statements to them. This document has been checked and approved by the attending provider.

## 2021-04-02 ENCOUNTER — Other Ambulatory Visit: Payer: Self-pay

## 2021-04-02 ENCOUNTER — Ambulatory Visit
Admission: RE | Admit: 2021-04-02 | Discharge: 2021-04-02 | Disposition: A | Discharge status: Home / Self Care | Payer: 59 | Source: Ambulatory Visit | Attending: Radiation Oncology | Admitting: Radiation Oncology

## 2021-04-02 ENCOUNTER — Encounter: Payer: Self-pay | Admitting: Radiation Oncology

## 2021-04-02 DIAGNOSIS — E042 Nontoxic multinodular goiter: Secondary | ICD-10-CM | POA: Insufficient documentation

## 2021-04-02 DIAGNOSIS — C3411 Malignant neoplasm of upper lobe, right bronchus or lung: Secondary | ICD-10-CM | POA: Insufficient documentation

## 2021-04-02 DIAGNOSIS — Z923 Personal history of irradiation: Secondary | ICD-10-CM | POA: Insufficient documentation

## 2021-04-02 DIAGNOSIS — J384 Edema of larynx: Secondary | ICD-10-CM | POA: Insufficient documentation

## 2021-04-02 DIAGNOSIS — R1310 Dysphagia, unspecified: Secondary | ICD-10-CM | POA: Diagnosis not present

## 2021-04-02 DIAGNOSIS — Z9221 Personal history of antineoplastic chemotherapy: Secondary | ICD-10-CM | POA: Diagnosis not present

## 2021-04-02 DIAGNOSIS — C3491 Malignant neoplasm of unspecified part of right bronchus or lung: Secondary | ICD-10-CM

## 2021-04-02 DIAGNOSIS — Z79899 Other long term (current) drug therapy: Secondary | ICD-10-CM | POA: Insufficient documentation

## 2021-04-02 DIAGNOSIS — J432 Centrilobular emphysema: Secondary | ICD-10-CM | POA: Insufficient documentation

## 2021-04-02 HISTORY — DX: Personal history of irradiation: Z92.3

## 2021-04-02 MED ORDER — LIDOCAINE VISCOUS HCL 2 % MT SOLN: 10.0000 mL | Freq: Four times a day (QID) | OROMUCOSAL | 1 refills | Status: DC | PRN

## 2021-04-02 NOTE — Progress Notes (Signed)
Abigail Clark is here today for follow up post radiation to the lung.  Lung Side: Right,completed radiation treatment on 03/09/21.  Does the patient complain of any of the following: Pain:Patient reports that she continues to have a sore throat. Continues to use lidocaine which has been effective.  Shortness of breath w/wo exertion: no Cough: Reports having a productive cough Hemoptysis: no Pain with swallowing: reports improvement with swallowing.  Swallowing/choking concerns: no Appetite: Good Energy Level: reports improvement in energy level.  Post radiation skin Changes: Reports peeling and darken of skin to the back and under right arm.  Weight-  Wt Readings from Last 3 Encounters:  04/02/21 181 lb 3.2 oz (82.2 kg)  03/30/21 183 lb 4.8 oz (83.1 kg)  03/12/21 187 lb (84.8 kg)    Medical oncology follow up- 05/04/21 - Dr. Julien Nordmann    Additional comments if applicable:  Vitals:   58/06/38 0900  BP: 113/77  Pulse: 92  Resp: 20  Temp: 97.8 F (36.6 C)  SpO2: 100%  Weight: 181 lb 3.2 oz (82.2 kg)  Height: 5\' 7"  (1.702 m)

## 2021-04-07 ENCOUNTER — Inpatient Hospital Stay: Payer: 59

## 2021-04-07 ENCOUNTER — Other Ambulatory Visit: Payer: Self-pay

## 2021-04-07 VITALS — BP 100/65 | HR 94 | Temp 98.4°F | Resp 16 | Wt 187.2 lb

## 2021-04-07 DIAGNOSIS — Z5111 Encounter for antineoplastic chemotherapy: Secondary | ICD-10-CM

## 2021-04-07 DIAGNOSIS — Z5112 Encounter for antineoplastic immunotherapy: Secondary | ICD-10-CM | POA: Diagnosis not present

## 2021-04-07 DIAGNOSIS — C3491 Malignant neoplasm of unspecified part of right bronchus or lung: Secondary | ICD-10-CM

## 2021-04-07 LAB — CMP (CANCER CENTER ONLY)
ALT: 23 U/L (ref 0–44)
AST: 23 U/L (ref 15–41)
Albumin: 3.7 g/dL (ref 3.5–5.0)
Alkaline Phosphatase: 100 U/L (ref 38–126)
Anion gap: 11 (ref 5–15)
BUN: 7 mg/dL (ref 6–20)
CO2: 22 mmol/L (ref 22–32)
Calcium: 9.7 mg/dL (ref 8.9–10.3)
Chloride: 108 mmol/L (ref 98–111)
Creatinine: 0.69 mg/dL (ref 0.44–1.00)
GFR, Estimated: >60 mL/min (ref >60)
Glucose, Bld: 91 mg/dL (ref 70–99)
Potassium: 4.1 mmol/L (ref 3.5–5.1)
Sodium: 141 mmol/L (ref 135–145)
Total Bilirubin: 0.3 mg/dL (ref 0.3–1.2)
Total Protein: 7.9 g/dL (ref 6.5–8.1)

## 2021-04-07 LAB — CBC WITH DIFFERENTIAL (CANCER CENTER ONLY)
Abs Immature Granulocytes: 0.02 10*3/uL (ref 0.00–0.07)
Basophils Absolute: 0 10*3/uL (ref 0.0–0.1)
Basophils Relative: 1 %
Eosinophils Absolute: 0.3 10*3/uL (ref 0.0–0.5)
Eosinophils Relative: 6 %
HCT: 35.1 % — ABNORMAL LOW (ref 36.0–46.0)
Hemoglobin: 12 g/dL (ref 12.0–15.0)
Immature Granulocytes: 0 %
Lymphocytes Relative: 17 %
Lymphs Abs: 1 10*3/uL (ref 0.7–4.0)
MCH: 31.3 pg (ref 26.0–34.0)
MCHC: 34.2 g/dL (ref 30.0–36.0)
MCV: 91.6 fL (ref 80.0–100.0)
Monocytes Absolute: 0.6 10*3/uL (ref 0.1–1.0)
Monocytes Relative: 11 %
Neutro Abs: 3.6 10*3/uL (ref 1.7–7.7)
Neutrophils Relative %: 65 %
Platelet Count: 293 10*3/uL (ref 150–400)
RBC: 3.83 MIL/uL — ABNORMAL LOW (ref 3.87–5.11)
RDW: 21 % — ABNORMAL HIGH (ref 11.5–15.5)
WBC Count: 5.6 10*3/uL (ref 4.0–10.5)
nRBC: 0 % (ref 0.0–0.2)

## 2021-04-07 LAB — PREGNANCY, URINE: Preg Test, Ur: NEGATIVE

## 2021-04-07 LAB — TSH: TSH: 0.53 u[IU]/mL (ref 0.308–3.960)

## 2021-04-07 MED ORDER — SODIUM CHLORIDE 0.9 % IV SOLN
1500.0000 mg | Freq: Once | INTRAVENOUS | Status: AC
  Administered 2021-04-07: 1500 mg via INTRAVENOUS
  Filled 2021-04-07: qty 30

## 2021-04-07 MED ORDER — SODIUM CHLORIDE 0.9 % IV SOLN: Freq: Once | INTRAVENOUS | Status: AC

## 2021-04-07 NOTE — Patient Instructions (Signed)
[facility]  Discharge Instructions: Thank you for choosing [facility] to provide your oncology and hematology care.   If you have a lab appointment with the [facility], please go directly to the [facility] and check in at the registration area.   Wear comfortable clothing and clothing appropriate for easy access to any Portacath or PICC line.   We strive to give you quality time with your provider. You may need to reschedule your appointment if you arrive late (15 or more minutes).  Arriving late affects you and other patients whose appointments are after yours.  Also, if you miss three or more appointments without notifying the office, you may be dismissed from the clinic at the provider's discretion.      For prescription refill requests, have your pharmacy contact our office and allow 72 hours for refills to be completed.    Today you received the following chemotherapy and/or immunotherapy agents: Durvalumab      To help prevent nausea and vomiting after your treatment, we encourage you to take your nausea medication as directed.  BELOW ARE SYMPTOMS THAT SHOULD BE REPORTED IMMEDIATELY: *FEVER GREATER THAN 100.4 F (38 C) OR HIGHER *CHILLS OR SWEATING *NAUSEA AND VOMITING THAT IS NOT CONTROLLED WITH YOUR NAUSEA MEDICATION *UNUSUAL SHORTNESS OF BREATH *UNUSUAL BRUISING OR BLEEDING *URINARY PROBLEMS (pain or burning when urinating, or frequent urination) *BOWEL PROBLEMS (unusual diarrhea, constipation, pain near the anus) TENDERNESS IN MOUTH AND THROAT WITH OR WITHOUT PRESENCE OF ULCERS (sore throat, sores in mouth, or a toothache) UNUSUAL RASH, SWELLING OR PAIN  UNUSUAL VAGINAL DISCHARGE OR ITCHING   Items with * indicate a potential emergency and should be followed up as soon as possible or go to the Emergency Department if any problems should occur.  Please show the CHEMOTHERAPY ALERT CARD or IMMUNOTHERAPY ALERT CARD at check-in to  the Emergency Department and triage nurse.  Should you have questions after your visit or need to cancel or reschedule your appointment, please contact [facility]  Dept: [phone]  and follow the prompts.  Office hours are 8:00 a.m. to 4:30 p.m. Monday - Friday. Please note that voicemails left after 4:00 p.m. may not be returned until the following business day.  We are closed weekends and major holidays. You have access to a nurse at all times for urgent questions. Please call the main number to the clinic Dept: [phone] and follow the prompts.   For any non-urgent questions, you may also contact your provider using MyChart. We now offer e-Visits for anyone 16 and older to request care online for non-urgent symptoms. For details visit [email or web address].   Also download the MyChart app! Go to the app store, search "MyChart", open the app, select [facility], and log in with your MyChart username and password.  Due to Covid, a mask is required upon entering the hospital/clinic. If you do not have a mask, one will be given to you upon arrival. For doctor visits, patients may have 1 support person aged 54 or older with them. For treatment visits, patients cannot have anyone with them due to current Covid guidelines and our immunocompromised population.   Durvalumab injection What is this medication? DURVALUMAB (dur VAL ue mab) is a monoclonal antibody. It is used to treat lung cancer. This medicine may be used for other purposes; ask your health care provider or pharmacist if you have questions. COMMON BRAND NAME(S): [email or web address] What should I tell  my care team before I take this medication? They need to know if you have any of these conditions: autoimmune diseases like Crohn's disease, ulcerative colitis, or lupus have had or planning to have an allogeneic stem cell transplant (uses someone else's stem cells) history of organ transplant history of radiation  to the chest nervous system problems like myasthenia gravis or Guillain-Barre syndrome an unusual or allergic reaction to durvalumab, other medicines, foods, dyes, or preservatives pregnant or trying to get pregnant breast-feeding How should I use this medication? This medicine is for infusion into a vein. It is given by a health care professional in a hospital or clinic setting. A special MedGuide will be given to you before each treatment. Be sure to read this information carefully each time. Talk to your pediatrician regarding the use of this medicine in children. Special care may be needed. Overdosage: If you think you have taken too much of this medicine contact a poison control center or emergency room at once. NOTE: This medicine is only for you. Do not share this medicine with others. What if I miss a dose? It is important not to miss your dose. Call your doctor or health care professional if you are unable to keep an appointment. What may interact with this medication? Interactions have not been studied. This list may not describe all possible interactions. Give your health care provider a list of all the medicines, herbs, non-prescription drugs, or dietary supplements you use. Also tell them if you smoke, drink alcohol, or use illegal drugs. Some items may interact with your medicine. What should I watch for while using this medication? This drug may make you feel generally unwell. Continue your course of treatment even though you feel ill unless your doctor tells you to stop. You may need blood work done while you are taking this medicine. Do not become pregnant while taking this medicine or for 3 months after stopping it. Women should inform their doctor if they wish to become pregnant or think they might be pregnant. There is a potential for serious side effects to an unborn child. Talk to your health care professional or pharmacist for more information. Do not breast-feed an infant  while taking this medicine or for 3 months after stopping it. What side effects may I notice from receiving this medication? Side effects that you should report to your doctor or health care professional as soon as possible: allergic reactions like skin rash, itching or hives, swelling of the face, lips, or tongue black, tarry stools bloody or watery diarrhea breathing problems change in emotions or moods change in sex drive changes in vision chest pain or chest tightness chills confusion cough facial flushing fever headache signs and symptoms of high blood sugar such as dizziness; dry mouth; dry skin; fruity breath; nausea; stomach pain; increased hunger or thirst; increased urination signs and symptoms of liver injury like dark yellow or brown urine; general ill feeling or flu-like symptoms; light-colored stools; loss of appetite; nausea; right upper belly pain; unusually weak or tired; yellowing of the eyes or skin stomach pain trouble passing urine or change in the amount of urine weight gain or weight loss Side effects that usually do not require medical attention (report these to your doctor or health care professional if they continue or are bothersome): bone pain constipation loss of appetite muscle pain nausea swelling of the ankles, feet, hands tiredness This list may not describe all possible side effects. Call your doctor for medical advice about  side effects. You may report side effects to FDA at 1-800-FDA-1088. Where should I keep my medication? This drug is given in a hospital or clinic and will not be stored at home. NOTE: This sheet is a summary. It may not cover all possible information. If you have questions about this medicine, talk to your doctor, pharmacist, or health care provider.  2022 Elsevier/Gold Standard (2019-08-09 13:01:29)

## 2021-05-04 ENCOUNTER — Other Ambulatory Visit: Payer: Self-pay

## 2021-05-04 ENCOUNTER — Inpatient Hospital Stay: Payer: 59

## 2021-05-04 ENCOUNTER — Inpatient Hospital Stay: Payer: 59 | Attending: Internal Medicine

## 2021-05-04 ENCOUNTER — Inpatient Hospital Stay (HOSPITAL_BASED_OUTPATIENT_CLINIC_OR_DEPARTMENT_OTHER): Payer: 59 | Admitting: Internal Medicine

## 2021-05-04 ENCOUNTER — Encounter: Payer: Self-pay | Admitting: Internal Medicine

## 2021-05-04 VITALS — BP 94/73 | HR 97 | Temp 97.5°F | Resp 20 | Wt 190.4 lb

## 2021-05-04 DIAGNOSIS — C3491 Malignant neoplasm of unspecified part of right bronchus or lung: Secondary | ICD-10-CM

## 2021-05-04 DIAGNOSIS — Z5112 Encounter for antineoplastic immunotherapy: Secondary | ICD-10-CM | POA: Diagnosis present

## 2021-05-04 DIAGNOSIS — C3411 Malignant neoplasm of upper lobe, right bronchus or lung: Secondary | ICD-10-CM | POA: Insufficient documentation

## 2021-05-04 DIAGNOSIS — Z5111 Encounter for antineoplastic chemotherapy: Secondary | ICD-10-CM

## 2021-05-04 DIAGNOSIS — Z79899 Other long term (current) drug therapy: Secondary | ICD-10-CM | POA: Insufficient documentation

## 2021-05-04 LAB — CMP (CANCER CENTER ONLY)
ALT: 11 U/L (ref 0–44)
AST: 11 U/L — ABNORMAL LOW (ref 15–41)
Albumin: 4.1 g/dL (ref 3.5–5.0)
Alkaline Phosphatase: 90 U/L (ref 38–126)
Anion gap: 11 (ref 5–15)
BUN: 8 mg/dL (ref 6–20)
CO2: 22 mmol/L (ref 22–32)
Calcium: 9.9 mg/dL (ref 8.9–10.3)
Chloride: 106 mmol/L (ref 98–111)
Creatinine: 0.79 mg/dL (ref 0.44–1.00)
GFR, Estimated: >60 mL/min (ref >60)
Glucose, Bld: 103 mg/dL — ABNORMAL HIGH (ref 70–99)
Potassium: 3.8 mmol/L (ref 3.5–5.1)
Sodium: 139 mmol/L (ref 135–145)
Total Bilirubin: 0.3 mg/dL (ref 0.3–1.2)
Total Protein: 8.1 g/dL (ref 6.5–8.1)

## 2021-05-04 LAB — CBC WITH DIFFERENTIAL (CANCER CENTER ONLY)
Abs Immature Granulocytes: 0.01 10*3/uL (ref 0.00–0.07)
Basophils Absolute: 0 10*3/uL (ref 0.0–0.1)
Basophils Relative: 1 %
Eosinophils Absolute: 0.6 10*3/uL — ABNORMAL HIGH (ref 0.0–0.5)
Eosinophils Relative: 8 %
HCT: 35.2 % — ABNORMAL LOW (ref 36.0–46.0)
Hemoglobin: 12.2 g/dL (ref 12.0–15.0)
Immature Granulocytes: 0 %
Lymphocytes Relative: 15 %
Lymphs Abs: 1.2 10*3/uL (ref 0.7–4.0)
MCH: 31.6 pg (ref 26.0–34.0)
MCHC: 34.7 g/dL (ref 30.0–36.0)
MCV: 91.2 fL (ref 80.0–100.0)
Monocytes Absolute: 0.5 10*3/uL (ref 0.1–1.0)
Monocytes Relative: 7 %
Neutro Abs: 5.3 10*3/uL (ref 1.7–7.7)
Neutrophils Relative %: 69 %
Platelet Count: 259 10*3/uL (ref 150–400)
RBC: 3.86 MIL/uL — ABNORMAL LOW (ref 3.87–5.11)
RDW: 16 % — ABNORMAL HIGH (ref 11.5–15.5)
WBC Count: 7.6 10*3/uL (ref 4.0–10.5)
nRBC: 0 % (ref 0.0–0.2)

## 2021-05-04 LAB — TSH: TSH: 0.707 u[IU]/mL (ref 0.308–3.960)

## 2021-05-04 LAB — PREGNANCY, URINE: Preg Test, Ur: NEGATIVE

## 2021-05-04 MED ORDER — SODIUM CHLORIDE 0.9 % IV SOLN
1500.0000 mg | Freq: Once | INTRAVENOUS | Status: AC
  Administered 2021-05-04: 1500 mg via INTRAVENOUS
  Filled 2021-05-04: qty 30

## 2021-05-04 MED ORDER — SODIUM CHLORIDE 0.9 % IV SOLN: Freq: Once | INTRAVENOUS | Status: AC

## 2021-05-04 NOTE — Patient Instructions (Signed)
[facility]  Discharge Instructions: °Thank you for choosing [facility] to provide your oncology and hematology care.  ° °If you have a lab appointment with the Cancer Center, please go directly to the Cancer Center and check in at the registration area. °  °Wear comfortable clothing and clothing appropriate for easy access to any Portacath or PICC line.  ° °We strive to give you quality time with your provider. You may need to reschedule your appointment if you arrive late (15 or more minutes).  Arriving late affects you and other patients whose appointments are after yours.  Also, if you miss three or more appointments without notifying the office, you may be dismissed from the clinic at the provider’s discretion.    °  °For prescription refill requests, have your pharmacy contact our office and allow 72 hours for refills to be completed.   ° °Today you received the following chemotherapy and/or immunotherapy agents :  Durvalumab °    °  °To help prevent nausea and vomiting after your treatment, we encourage you to take your nausea medication as directed. ° °BELOW ARE SYMPTOMS THAT SHOULD BE REPORTED IMMEDIATELY: °*FEVER GREATER THAN 100.4 F (38 °C) OR HIGHER °*CHILLS OR SWEATING °*NAUSEA AND VOMITING THAT IS NOT CONTROLLED WITH YOUR NAUSEA MEDICATION °*UNUSUAL SHORTNESS OF BREATH °*UNUSUAL BRUISING OR BLEEDING °*URINARY PROBLEMS (pain or burning when urinating, or frequent urination) °*BOWEL PROBLEMS (unusual diarrhea, constipation, pain near the anus) °TENDERNESS IN MOUTH AND THROAT WITH OR WITHOUT PRESENCE OF ULCERS (sore throat, sores in mouth, or a toothache) °UNUSUAL RASH, SWELLING OR PAIN  °UNUSUAL VAGINAL DISCHARGE OR ITCHING  ° °Items with * indicate a potential emergency and should be followed up as soon as possible or go to the Emergency Department if any problems should occur. ° °Please show the CHEMOTHERAPY ALERT CARD or IMMUNOTHERAPY ALERT CARD at  check-in to the Emergency Department and triage nurse. ° °Should you have questions after your visit or need to cancel or reschedule your appointment, please contact [facility]  Dept: [phone]  and follow the prompts.  Office hours are 8:00 a.m. to 4:30 p.m. Monday - Friday. Please note that voicemails left after 4:00 p.m. may not be returned until the following business day.  We are closed weekends and major holidays. You have access to a nurse at all times for urgent questions. Please call the main number to the clinic Dept: [phone] and follow the prompts. ° ° °For any non-urgent questions, you may also contact your provider using MyChart. We now offer e-Visits for anyone 18 and older to request care online for non-urgent symptoms. For details visit [email or web address]. °  °Also download the MyChart app! Go to the app store, search "MyChart", open the app, select , and log in with your MyChart username and password. ° °Due to Covid, a mask is required upon entering the hospital/clinic. If you do not have a mask, one will be given to you upon arrival. For doctor visits, patients may have 1 support person aged 18 or older with them. For treatment visits, patients cannot have anyone with them due to current Covid guidelines and our immunocompromised population.  ° °

## 2021-05-04 NOTE — Patient Instructions (Signed)
Steps to Quit Smoking Smoking tobacco is the leading cause of preventable death. It can affect almost every organ in the body. Smoking puts you and people around you at risk for many serious, long-lasting (chronic) diseases. Quitting smoking can be hard, but it is one of the best things that you can do for your health. It is never too late to quit. How do I get ready to quit? When you decide to quit smoking, make a plan to help you succeed. Before you quit: Pick a date to quit. Set a date within the next 2 weeks to give you time to prepare. Write down the reasons why you are quitting. Keep this list in places where you will see it often. Tell your family, friends, and co-workers that you are quitting. Their support is important. Talk with your doctor about the choices that may help you quit. Find out if your health insurance will pay for these treatments. Know the people, places, things, and activities that make you want to smoke (triggers). Avoid them. What first steps can I take to quit smoking? Throw away all cigarettes at home, at work, and in your car. Throw away the things that you use when you smoke, such as ashtrays and lighters. Clean your car. Make sure to empty the ashtray. Clean your home, including curtains and carpets. What can I do to help me quit smoking? Talk with your doctor about taking medicines and seeing a counselor at the same time. You are more likely to succeed when you do both. If you are pregnant or breastfeeding, talk with your doctor about counseling or other ways to quit smoking. Do not take medicine to help you quit smoking unless your doctor tells you to do so. To quit smoking: Quit right away Quit smoking totally, instead of slowly cutting back on how much you smoke over a period of time. Go to counseling. You are more likely to quit if you go to counseling sessions regularly. Take medicine You may take medicines to help you quit. Some medicines need a  prescription, and some you can buy over-the-counter. Some medicines may contain a drug called nicotine to replace the nicotine in cigarettes. Medicines may: Help you to stop having the desire to smoke (cravings). Help to stop the problems that come when you stop smoking (withdrawal symptoms). Your doctor may ask you to use: Nicotine patches, gum, or lozenges. Nicotine inhalers or sprays. Non-nicotine medicine that is taken by mouth. Find resources Find resources and other ways to help you quit smoking and remain smoke-free after you quit. These resources are most helpful when you use them often. They include: Online chats with a counselor. Phone quitlines. Printed self-help materials. Support groups or group counseling. Text messaging programs. Mobile phone apps. Use apps on your mobile phone or tablet that can help you stick to your quit plan. There are many free apps for mobile phones and tablets as well as websites. Examples include Quit Guide from the CDC and smokefree.gov  What things can I do to make it easier to quit?  Talk to your family and friends. Ask them to support and encourage you. Call a phone quitline (1-800-QUIT-NOW), reach out to support groups, or work with a counselor. Ask people who smoke to not smoke around you. Avoid places that make you want to smoke, such as: Bars. Parties. Smoke-break areas at work. Spend time with people who do not smoke. Lower the stress in your life. Stress can make you want to   smoke. Try these things to help your stress: Getting regular exercise. Doing deep-breathing exercises. Doing yoga. Meditating. Doing a body scan. To do this, close your eyes, focus on one area of your body at a time from head to toe. Notice which parts of your body are tense. Try to relax the muscles in those areas. How will I feel when I quit smoking? Day 1 to 3 weeks Within the first 24 hours, you may start to have some problems that come from quitting tobacco.  These problems are very bad 2-3 days after you quit, but they do not often last for more than 2-3 weeks. You may get these symptoms: Mood swings. Feeling restless, nervous, angry, or annoyed. Trouble concentrating. Dizziness. Strong desire for high-sugar foods and nicotine. Weight gain. Trouble pooping (constipation). Feeling like you may vomit (nausea). Coughing or a sore throat. Changes in how the medicines that you take for other issues work in your body. Depression. Trouble sleeping (insomnia). Week 3 and afterward After the first 2-3 weeks of quitting, you may start to notice more positive results, such as: Better sense of smell and taste. Less coughing and sore throat. Slower heart rate. Lower blood pressure. Clearer skin. Better breathing. Fewer sick days. Quitting smoking can be hard. Do not give up if you fail the first time. Some people need to try a few times before they succeed. Do your best to stick to your quit plan, and talk with your doctor if you have any questions or concerns. Summary Smoking tobacco is the leading cause of preventable death. Quitting smoking can be hard, but it is one of the best things that you can do for your health. When you decide to quit smoking, make a plan to help you succeed. Quit smoking right away, not slowly over a period of time. When you start quitting, seek help from your doctor, family, or friends. This information is not intended to replace advice given to you by your health care provider. Make sure you discuss any questions you have with your health care provider. Document Revised: 02/06/2021 Document Reviewed: 08/19/2018 Elsevier Patient Education  2022 [employer] Inc.  

## 2021-05-04 NOTE — Progress Notes (Signed)
[place] [phone]   Fax:(336) [phone]  OFFICE PROGRESS NOTE  Koirala, Dibas, MD [address] [place] [address]  DIAGNOSIS: stage IIIC (T4, N3, M0) non-small cell lung cancer, adenocarcinoma presented with large right upper lobe lung mass with associated narrowing and occlusion of the right pulmonary artery superior trunk as well as narrowing of the interlobular right pulmonary artery and encasement and narrowing of the right upper lobe bronchus in addition to right hilar and subcarinal lymphadenopathy and suspicious lesion in the liver.  This was diagnosed in August 2022.  Molecular studies by [employer] showed no actionable mutations.  PRIOR THERAPY: Concurrent chemoradiation with weekly carboplatin for AUC of 2 and paclitaxel 45 Mg/M2.  Status post 6 cycles.  Last dose was given on 03/02/2021.  CURRENT THERAPY: Consolidation treatment with immunotherapy with Imfinzi 1500 Mg IV every 4 weeks.  First dose April 06, 2021.  Status post 1 cycle  INTERVAL HISTORY: Abigail Clark 45 y.o. female returns to the clinic today for follow-up visit.  The patient is feeling fine today with no concerning complaints.  She denied having any chest pain, shortness of breath, cough or hemoptysis.  Her sore throat has significantly improved.  She denied having any weight loss or night sweats.  She has no nausea, vomiting, diarrhea or constipation.  She has no headache or visual changes.  She tolerated the first cycle of consolidation treatment with immunotherapy fairly well.  She is here today for evaluation before starting cycle #2.  MEDICAL HISTORY: Past Medical History:  Diagnosis Date   Cataract    RIGHT EYE   Eczema    Exotropia of right eye 11/2014   History of radiation therapy    Right Lung- 01/26/21-03/09/21- Dr. Gery Pray   Irritable bowel syndrome (IBS)    no current med.   Lactose intolerance     ALLERGIES:  is allergic  to penicillins.  MEDICATIONS:  Current Outpatient Medications  Medication Sig Dispense Refill   cholecalciferol (VITAMIN D3) 25 MCG (1000 UNIT) tablet Take 1,000 Units by mouth daily.     Multiple Vitamin (MULTIVITAMIN) tablet Take 1 tablet by mouth daily.     omeprazole (PRILOSEC) 20 MG capsule Take 20 mg by mouth 2 (two) times daily as needed (indigestion).     UNABLE TO FIND Sea moss, burdock root, macca,     vitamin C (ASCORBIC ACID) 250 MG tablet Take 250 mg by mouth daily.     vitamin E 1000 UNIT capsule Take 1,000 Units by mouth daily.     oxyCODONE-acetaminophen (PERCOCET/ROXICET) 5-325 MG tablet Take 1 tablet by mouth every 4 (four) hours as needed for severe pain. (Patient not taking: Reported on 04/02/2021) 30 tablet 0   prochlorperazine (COMPAZINE) 10 MG tablet Take 1 tablet (10 mg total) by mouth every 6 (six) hours as needed for nausea or vomiting. (Patient not taking: Reported on 04/02/2021) 30 tablet 0   No current facility-administered medications for this visit.    SURGICAL HISTORY:  Past Surgical History:  Procedure Laterality Date   BRONCHIAL BIOPSY  01/12/2021   Procedure: BRONCHIAL BIOPSIES;  Surgeon: Spero Geralds, MD;  Location: [facility] ENDOSCOPY;  Service: Pulmonary;;   BRONCHIAL NEEDLE ASPIRATION BIOPSY  01/12/2021   Procedure: BRONCHIAL NEEDLE ASPIRATION BIOPSIES;  Surgeon: Spero Geralds, MD;  Location: [facility] ENDOSCOPY;  Service: Pulmonary;;   CATARACT PEDIATRIC Right 1986   LEEP  05/20/2006   LIPOSUCTION  10/2014  abd. - local anes.   STRABISMUS SURGERY Right 11/29/2014   Procedure: REPAIR STRABISMUS RIGHT EYE ;  Surgeon: Everitt Amber, MD;  Location: [place];  Service: Ophthalmology;  Laterality: Right;   VIDEO BRONCHOSCOPY WITH ENDOBRONCHIAL ULTRASOUND N/A 01/12/2021   Procedure: VIDEO BRONCHOSCOPY WITH ENDOBRONCHIAL ULTRASOUND;  Surgeon: Spero Geralds, MD;  Location: [facility] ENDOSCOPY;  Service: Pulmonary;  Laterality: N/A;   WISDOM TOOTH EXTRACTION       REVIEW OF SYSTEMS:  A comprehensive review of systems was negative except for: Constitutional: positive for fatigue   PHYSICAL EXAMINATION: General appearance: alert, cooperative, fatigued, and no distress Head: Normocephalic, without obvious abnormality, atraumatic Neck: no adenopathy, no JVD, supple, symmetrical, trachea midline, and thyroid not enlarged, symmetric, no tenderness/mass/nodules Lymph nodes: Cervical, supraclavicular, and axillary nodes normal. Resp: clear to auscultation bilaterally Back: symmetric, no curvature. ROM normal. No CVA tenderness. Cardio: regular rate and rhythm, S1, S2 normal, no murmur, click, rub or gallop GI: soft, non-tender; bowel sounds normal; no masses,  no organomegaly Extremities: extremities normal, atraumatic, no cyanosis or edema  ECOG PERFORMANCE STATUS: 1 - Symptomatic but completely ambulatory  Blood pressure 94/73, pulse 97, temperature (!) 97.5 F (36.4 C), resp. rate 20, weight 190 lb 6.4 oz (86.4 kg), SpO2 100 %.  LABORATORY DATA: Lab Results  Component Value Date   WBC 7.6 05/04/2021   HGB 12.2 05/04/2021   HCT 35.2 (L) 05/04/2021   MCV 91.2 05/04/2021   PLT 259 05/04/2021      Chemistry      Component Value Date/Time   NA 139 05/04/2021 0948   K 3.8 05/04/2021 0948   CL 106 05/04/2021 0948   CO2 22 05/04/2021 0948   BUN 8 05/04/2021 0948   CREATININE 0.79 05/04/2021 0948      Component Value Date/Time   CALCIUM 9.9 05/04/2021 0948   ALKPHOS 90 05/04/2021 0948   AST 11 (L) 05/04/2021 0948   ALT 11 05/04/2021 0948   BILITOT 0.3 05/04/2021 0948       RADIOGRAPHIC STUDIES: No results found.  ASSESSMENT AND PLAN: This is a very pleasant 45 years old African-American female diagnosed with a stage IIIC (T4, N3, M0) non-small cell lung cancer, adenocarcinoma presented with large right upper lobe lung mass with narrowing and occlusion of the right pulmonary artery superior trunk as well as narrowing of the  interlobular right pulmonary artery and encasement and narrowing of the right upper lobe bronchus in addition to right hilar and mediastinal lymphadenopathy and suspicious liver lesion. The patient had molecular studies by [employer] that showed no actionable mutation. She had a PET scan performed last week and that showed no evidence for metastatic disease outside the chest but there was evidence of metastatic disease to the left supraclavicular area. The patient underwent a course of concurrent chemoradiation with weekly carboplatin for AUC of 2 and paclitaxel 45 Mg/M2.  Status post 6 cycles.  Last dose was given on 03/02/2021.  Follow-up CT scan of the chest after the induction treatment showed significant decrease in the size of the right lung mass.  The patient is currently undergoing consolidation treatment with immunotherapy with Imfinzi 1500 Mg IV every 4 weeks status post 1 cycle. She tolerated the first cycle of her treatment well with no concerning adverse effects. I recommended for the patient to proceed with cycle #2 today as planned. I will see her back for follow-up visit in 4 weeks for evaluation before the next cycle of her treatment.  The patient was advised to call immediately if she has any concerning symptoms in the interval. The patient voices understanding of current disease status and treatment options and is in agreement with the current care plan.  All questions were answered. The patient knows to call the clinic with any problems, questions or concerns. We can certainly see the patient much sooner if necessary.   Disclaimer: This note was dictated with voice recognition software. Similar sounding words can inadvertently be transcribed and may not be corrected upon review.

## 2021-06-01 ENCOUNTER — Inpatient Hospital Stay: Payer: 59

## 2021-06-01 ENCOUNTER — Encounter: Payer: Self-pay | Admitting: Internal Medicine

## 2021-06-01 ENCOUNTER — Inpatient Hospital Stay (HOSPITAL_BASED_OUTPATIENT_CLINIC_OR_DEPARTMENT_OTHER): Payer: 59 | Admitting: Internal Medicine

## 2021-06-01 ENCOUNTER — Other Ambulatory Visit: Payer: Self-pay

## 2021-06-01 ENCOUNTER — Inpatient Hospital Stay: Payer: 59 | Attending: Internal Medicine

## 2021-06-01 VITALS — BP 111/69 | HR 97 | Temp 97.9°F | Resp 19 | Ht 67.0 in | Wt 199.5 lb

## 2021-06-01 DIAGNOSIS — C3490 Malignant neoplasm of unspecified part of unspecified bronchus or lung: Secondary | ICD-10-CM

## 2021-06-01 DIAGNOSIS — Z5112 Encounter for antineoplastic immunotherapy: Secondary | ICD-10-CM | POA: Insufficient documentation

## 2021-06-01 DIAGNOSIS — C3411 Malignant neoplasm of upper lobe, right bronchus or lung: Secondary | ICD-10-CM | POA: Insufficient documentation

## 2021-06-01 DIAGNOSIS — C3491 Malignant neoplasm of unspecified part of right bronchus or lung: Secondary | ICD-10-CM

## 2021-06-01 DIAGNOSIS — Z5111 Encounter for antineoplastic chemotherapy: Secondary | ICD-10-CM

## 2021-06-01 LAB — CBC WITH DIFFERENTIAL (CANCER CENTER ONLY)
Abs Immature Granulocytes: 0.02 10*3/uL (ref 0.00–0.07)
Basophils Absolute: 0.1 10*3/uL (ref 0.0–0.1)
Basophils Relative: 1 %
Eosinophils Absolute: 0.3 10*3/uL (ref 0.0–0.5)
Eosinophils Relative: 5 %
HCT: 38.7 % (ref 36.0–46.0)
Hemoglobin: 12.8 g/dL (ref 12.0–15.0)
Immature Granulocytes: 0 %
Lymphocytes Relative: 14 %
Lymphs Abs: 1 10*3/uL (ref 0.7–4.0)
MCH: 30.7 pg (ref 26.0–34.0)
MCHC: 33.1 g/dL (ref 30.0–36.0)
MCV: 92.8 fL (ref 80.0–100.0)
Monocytes Absolute: 0.5 10*3/uL (ref 0.1–1.0)
Monocytes Relative: 8 %
Neutro Abs: 5.3 10*3/uL (ref 1.7–7.7)
Neutrophils Relative %: 72 %
Platelet Count: 264 10*3/uL (ref 150–400)
RBC: 4.17 MIL/uL (ref 3.87–5.11)
RDW: 13.3 % (ref 11.5–15.5)
WBC Count: 7.2 10*3/uL (ref 4.0–10.5)
nRBC: 0 % (ref 0.0–0.2)

## 2021-06-01 LAB — CMP (CANCER CENTER ONLY)
ALT: 19 U/L (ref 0–44)
AST: 23 U/L (ref 15–41)
Albumin: 4 g/dL (ref 3.5–5.0)
Alkaline Phosphatase: 99 U/L (ref 38–126)
Anion gap: 10 (ref 5–15)
BUN: 12 mg/dL (ref 6–20)
CO2: 22 mmol/L (ref 22–32)
Calcium: 9.6 mg/dL (ref 8.9–10.3)
Chloride: 107 mmol/L (ref 98–111)
Creatinine: 0.77 mg/dL (ref 0.44–1.00)
GFR, Estimated: >60 mL/min (ref >60)
Glucose, Bld: 106 mg/dL — ABNORMAL HIGH (ref 70–99)
Potassium: 3.8 mmol/L (ref 3.5–5.1)
Sodium: 139 mmol/L (ref 135–145)
Total Bilirubin: 0.2 mg/dL — ABNORMAL LOW (ref 0.3–1.2)
Total Protein: 8.2 g/dL — ABNORMAL HIGH (ref 6.5–8.1)

## 2021-06-01 LAB — PREGNANCY, URINE: Preg Test, Ur: NEGATIVE

## 2021-06-01 LAB — TSH: TSH: 1.019 u[IU]/mL (ref 0.308–3.960)

## 2021-06-01 MED ORDER — SODIUM CHLORIDE 0.9 % IV SOLN
Freq: Once | INTRAVENOUS | Status: AC
Start: 2021-06-01 — End: 2021-06-01

## 2021-06-01 MED ORDER — SODIUM CHLORIDE 0.9 % IV SOLN
1500.0000 mg | Freq: Once | INTRAVENOUS | Status: AC
  Administered 2021-06-01: 14:00:00 1500 mg via INTRAVENOUS
  Filled 2021-06-01: qty 30

## 2021-06-01 NOTE — Progress Notes (Signed)
[place] Telephone:(336) [phone]   Fax:(336) [phone]  OFFICE PROGRESS NOTE  Koirala, Dibas, MD [address] [place] [address]  DIAGNOSIS: stage IIIC (T4, N3, M0) non-small cell lung cancer, adenocarcinoma presented with large right upper lobe lung mass with associated narrowing and occlusion of the right pulmonary artery superior trunk as well as narrowing of the interlobular right pulmonary artery and encasement and narrowing of the right upper lobe bronchus in addition to right hilar and subcarinal lymphadenopathy and suspicious lesion in the liver.  This was diagnosed in August 2022.  Molecular studies by [employer] showed no actionable mutations.  PRIOR THERAPY: Concurrent chemoradiation with weekly carboplatin for AUC of 2 and paclitaxel 45 Mg/M2.  Status post 6 cycles.  Last dose was given on 03/02/2021.  CURRENT THERAPY: Consolidation treatment with immunotherapy with Imfinzi 1500 Mg IV every 4 weeks.  First dose April 06, 2021.  Status post 2 cycles.  INTERVAL HISTORY: Abigail Clark 45 y.o. female returns to the clinic today for follow-up visit.  The patient is feeling fine today with no concerning complaints except for mild fatigue.  She has been tolerating her treatment with Imfinzi fairly well.  She has no chest pain, shortness of breath, cough or hemoptysis.  She has no nausea, vomiting, diarrhea or constipation.  She has no headache or visual changes.  She denied having any significant weight loss or night sweats.  She is here today for evaluation before starting cycle #3 of her treatment.  MEDICAL HISTORY: Past Medical History:  Diagnosis Date   Cataract    RIGHT EYE   Eczema    Exotropia of right eye 11/2014   History of radiation therapy    Right Lung- 01/26/21-03/09/21- Dr. Gery Pray   Irritable bowel syndrome (IBS)    no current med.   Lactose intolerance     ALLERGIES:  is allergic to  penicillins.  MEDICATIONS:  Current Outpatient Medications  Medication Sig Dispense Refill   cholecalciferol (VITAMIN D3) 25 MCG (1000 UNIT) tablet Take 1,000 Units by mouth daily.     Multiple Vitamin (MULTIVITAMIN) tablet Take 1 tablet by mouth daily.     omeprazole (PRILOSEC) 20 MG capsule Take 20 mg by mouth 2 (two) times daily as needed (indigestion).     oxyCODONE-acetaminophen (PERCOCET/ROXICET) 5-325 MG tablet Take 1 tablet by mouth every 4 (four) hours as needed for severe pain. (Patient not taking: Reported on 04/02/2021) 30 tablet 0   prochlorperazine (COMPAZINE) 10 MG tablet Take 1 tablet (10 mg total) by mouth every 6 (six) hours as needed for nausea or vomiting. (Patient not taking: Reported on 04/02/2021) 30 tablet 0   UNABLE TO FIND Sea moss, burdock root, macca,     vitamin C (ASCORBIC ACID) 250 MG tablet Take 250 mg by mouth daily.     vitamin E 1000 UNIT capsule Take 1,000 Units by mouth daily.     No current facility-administered medications for this visit.    SURGICAL HISTORY:  Past Surgical History:  Procedure Laterality Date   BRONCHIAL BIOPSY  01/12/2021   Procedure: BRONCHIAL BIOPSIES;  Surgeon: Spero Geralds, MD;  Location: [facility] ENDOSCOPY;  Service: Pulmonary;;   BRONCHIAL NEEDLE ASPIRATION BIOPSY  01/12/2021   Procedure: BRONCHIAL NEEDLE ASPIRATION BIOPSIES;  Surgeon: Spero Geralds, MD;  Location[facility] ENDOSCOPY;  Service: Pulmonary;;   CATARACT PEDIATRIC Right 1986   LEEP  05/20/2006   LIPOSUCTION  10/2014   abd. -  local anes.   STRABISMUS SURGERY Right 11/29/2014   Procedure: REPAIR STRABISMUS RIGHT EYE ;  Surgeon: Everitt Amber, MD;  Location: [place];  Service: Ophthalmology;  Laterality: Right;   VIDEO BRONCHOSCOPY WITH ENDOBRONCHIAL ULTRASOUND N/A 01/12/2021   Procedure: VIDEO BRONCHOSCOPY WITH ENDOBRONCHIAL ULTRASOUND;  Surgeon: Spero Geralds, MD;  Location: [facility] ENDOSCOPY;  Service: Pulmonary;  Laterality: N/A;   WISDOM TOOTH EXTRACTION       REVIEW OF SYSTEMS:  A comprehensive review of systems was negative except for: Constitutional: positive for fatigue   PHYSICAL EXAMINATION: General appearance: alert, cooperative, fatigued, and no distress Head: Normocephalic, without obvious abnormality, atraumatic Neck: no adenopathy, no JVD, supple, symmetrical, trachea midline, and thyroid not enlarged, symmetric, no tenderness/mass/nodules Lymph nodes: Cervical, supraclavicular, and axillary nodes normal. Resp: clear to auscultation bilaterally Back: symmetric, no curvature. ROM normal. No CVA tenderness. Cardio: regular rate and rhythm, S1, S2 normal, no murmur, click, rub or gallop GI: soft, non-tender; bowel sounds normal; no masses,  no organomegaly Extremities: extremities normal, atraumatic, no cyanosis or edema  ECOG PERFORMANCE STATUS: 1 - Symptomatic but completely ambulatory  Blood pressure 111/69, pulse 97, temperature 97.9 F (36.6 C), temperature source Tympanic, resp. rate 19, height 5\' 7"  (1.702 m), weight 199 lb 8 oz (90.5 kg), SpO2 97 %.  LABORATORY DATA: Lab Results  Component Value Date   WBC 7.2 06/01/2021   HGB 12.8 06/01/2021   HCT 38.7 06/01/2021   MCV 92.8 06/01/2021   PLT 264 06/01/2021      Chemistry      Component Value Date/Time   NA 139 06/01/2021 1103   K 3.8 06/01/2021 1103   CL 107 06/01/2021 1103   CO2 22 06/01/2021 1103   BUN 12 06/01/2021 1103   CREATININE 0.77 06/01/2021 1103      Component Value Date/Time   CALCIUM 9.6 06/01/2021 1103   ALKPHOS 99 06/01/2021 1103   AST 23 06/01/2021 1103   ALT 19 06/01/2021 1103   BILITOT 0.2 (L) 06/01/2021 1103       RADIOGRAPHIC STUDIES: No results found.  ASSESSMENT AND PLAN: This is a very pleasant 45 years old African-American female diagnosed with a stage IIIC (T4, N3, M0) non-small cell lung cancer, adenocarcinoma presented with large right upper lobe lung mass with narrowing and occlusion of the right pulmonary artery superior  trunk as well as narrowing of the interlobular right pulmonary artery and encasement and narrowing of the right upper lobe bronchus in addition to right hilar and mediastinal lymphadenopathy and suspicious liver lesion. The patient had molecular studies by [employer] that showed no actionable mutation. She had a PET scan performed last week and that showed no evidence for metastatic disease outside the chest but there was evidence of metastatic disease to the left supraclavicular area. The patient underwent a course of concurrent chemoradiation with weekly carboplatin for AUC of 2 and paclitaxel 45 Mg/M2.  Status post 6 cycles.  Last dose was given on 03/02/2021.  Follow-up CT scan of the chest after the induction treatment showed significant decrease in the size of the right lung mass.  The patient is currently undergoing consolidation treatment with immunotherapy with Imfinzi 1500 Mg IV every 4 weeks status post 2 cycles. The patient has been tolerating this treatment well with no concerning adverse effect except for mild residual fatigue from the previous concurrent chemoradiation. I recommended for her to proceed with cycle #3 today as planned. I will see her back for follow-up visit  in 4 weeks for evaluation before starting cycle #4 with repeat CT scan of the chest for restaging of her disease. The patient was advised to call immediately if she has any concerning symptoms in the interval. The patient voices understanding of current disease status and treatment options and is in agreement with the current care plan.  All questions were answered. The patient knows to call the clinic with any problems, questions or concerns. We can certainly see the patient much sooner if necessary.   Disclaimer: This note was dictated with voice recognition software. Similar sounding words can inadvertently be transcribed and may not be corrected upon review.

## 2021-06-01 NOTE — Patient Instructions (Signed)
[facility]  Discharge Instructions: Thank you for choosing [facility] to provide your oncology and hematology care.   If you have a lab appointment with the Cancer Center, please go directly to the Cancer Center and check in at the registration area.   Wear comfortable clothing and clothing appropriate for easy access to any Portacath or PICC line.   We strive to give you quality time with your provider. You may need to reschedule your appointment if you arrive late (15 or more minutes).  Arriving late affects you and other patients whose appointments are after yours.  Also, if you miss three or more appointments without notifying the office, you may be dismissed from the clinic at the provider's discretion.      For prescription refill requests, have your pharmacy contact our office and allow 72 hours for refills to be completed.    Today you received the following chemotherapy and/or immunotherapy agents Imfinzi      To help prevent nausea and vomiting after your treatment, we encourage you to take your nausea medication as directed.  BELOW ARE SYMPTOMS THAT SHOULD BE REPORTED IMMEDIATELY: *FEVER GREATER THAN 100.4 F (38 C) OR HIGHER *CHILLS OR SWEATING *NAUSEA AND VOMITING THAT IS NOT CONTROLLED WITH YOUR NAUSEA MEDICATION *UNUSUAL SHORTNESS OF BREATH *UNUSUAL BRUISING OR BLEEDING *URINARY PROBLEMS (pain or burning when urinating, or frequent urination) *BOWEL PROBLEMS (unusual diarrhea, constipation, pain near the anus) TENDERNESS IN MOUTH AND THROAT WITH OR WITHOUT PRESENCE OF ULCERS (sore throat, sores in mouth, or a toothache) UNUSUAL RASH, SWELLING OR PAIN  UNUSUAL VAGINAL DISCHARGE OR ITCHING   Items with * indicate a potential emergency and should be followed up as soon as possible or go to the Emergency Department if any problems should occur.  Please show the CHEMOTHERAPY ALERT CARD or IMMUNOTHERAPY ALERT CARD at check-in to the  Emergency Department and triage nurse.  Should you have questions after your visit or need to cancel or reschedule your appointment, please contact [facility]  Dept: [phone]  and follow the prompts.  Office hours are 8:00 a.m. to 4:30 p.m. Monday - Friday. Please note that voicemails left after 4:00 p.m. may not be returned until the following business day.  We are closed weekends and major holidays. You have access to a nurse at all times for urgent questions. Please call the main number to the clinic Dept: [phone] and follow the prompts.   For any non-urgent questions, you may also contact your provider using MyChart. We now offer e-Visits for anyone 18 and older to request care online for non-urgent symptoms. For details visit [email or web address].   Also download the MyChart app! Go to the app store, search "MyChart", open the app, select [facility], and log in with your MyChart username and password.  Due to Covid, a mask is required upon entering the hospital/clinic. If you do not have a mask, one will be given to you upon arrival. For doctor visits, patients may have 1 support person aged 18 or older with them. For treatment visits, patients cannot have anyone with them due to current Covid guidelines and our immunocompromised population.   

## 2021-06-11 ENCOUNTER — Encounter: Payer: Self-pay | Admitting: Internal Medicine

## 2021-06-25 ENCOUNTER — Other Ambulatory Visit: Payer: Self-pay

## 2021-06-25 ENCOUNTER — Ambulatory Visit (HOSPITAL_COMMUNITY)
Admission: RE | Admit: 2021-06-25 | Discharge: 2021-06-25 | Disposition: A | Discharge status: Home / Self Care | Payer: Managed Care, Other (non HMO) | Source: Ambulatory Visit | Attending: Internal Medicine | Admitting: Internal Medicine

## 2021-06-25 DIAGNOSIS — C3490 Malignant neoplasm of unspecified part of unspecified bronchus or lung: Secondary | ICD-10-CM | POA: Diagnosis not present

## 2021-06-25 MED ORDER — SODIUM CHLORIDE (PF) 0.9 % IJ SOLN
INTRAMUSCULAR | Status: AC
  Filled 2021-06-25: qty 50

## 2021-06-25 MED ORDER — IOHEXOL 350 MG/ML SOLN
75.0000 mL | Freq: Once | INTRAVENOUS | Status: AC | PRN
  Administered 2021-06-25: 75 mL via INTRAVENOUS

## 2021-06-29 ENCOUNTER — Inpatient Hospital Stay: Payer: Commercial Managed Care - HMO

## 2021-06-29 ENCOUNTER — Encounter: Payer: Self-pay | Admitting: Internal Medicine

## 2021-06-29 ENCOUNTER — Other Ambulatory Visit: Payer: Self-pay

## 2021-06-29 ENCOUNTER — Inpatient Hospital Stay: Payer: Commercial Managed Care - HMO | Attending: Internal Medicine

## 2021-06-29 ENCOUNTER — Inpatient Hospital Stay (HOSPITAL_BASED_OUTPATIENT_CLINIC_OR_DEPARTMENT_OTHER): Payer: Commercial Managed Care - HMO | Admitting: Internal Medicine

## 2021-06-29 VITALS — BP 120/80 | HR 103 | Temp 97.2°F | Resp 20 | Ht 67.0 in | Wt 198.9 lb

## 2021-06-29 VITALS — HR 85

## 2021-06-29 DIAGNOSIS — Z79899 Other long term (current) drug therapy: Secondary | ICD-10-CM | POA: Insufficient documentation

## 2021-06-29 DIAGNOSIS — Z5112 Encounter for antineoplastic immunotherapy: Secondary | ICD-10-CM | POA: Diagnosis not present

## 2021-06-29 DIAGNOSIS — C3411 Malignant neoplasm of upper lobe, right bronchus or lung: Secondary | ICD-10-CM

## 2021-06-29 DIAGNOSIS — C3491 Malignant neoplasm of unspecified part of right bronchus or lung: Secondary | ICD-10-CM

## 2021-06-29 DIAGNOSIS — Z5111 Encounter for antineoplastic chemotherapy: Secondary | ICD-10-CM

## 2021-06-29 LAB — CBC WITH DIFFERENTIAL (CANCER CENTER ONLY)
Abs Immature Granulocytes: 0.02 10*3/uL (ref 0.00–0.07)
Basophils Absolute: 0 10*3/uL (ref 0.0–0.1)
Basophils Relative: 1 %
Eosinophils Absolute: 0.2 10*3/uL (ref 0.0–0.5)
Eosinophils Relative: 3 %
HCT: 39.9 % (ref 36.0–46.0)
Hemoglobin: 13.3 g/dL (ref 12.0–15.0)
Immature Granulocytes: 0 %
Lymphocytes Relative: 14 %
Lymphs Abs: 1.1 10*3/uL (ref 0.7–4.0)
MCH: 30.2 pg (ref 26.0–34.0)
MCHC: 33.3 g/dL (ref 30.0–36.0)
MCV: 90.5 fL (ref 80.0–100.0)
Monocytes Absolute: 0.7 10*3/uL (ref 0.1–1.0)
Monocytes Relative: 8 %
Neutro Abs: 5.9 10*3/uL (ref 1.7–7.7)
Neutrophils Relative %: 74 %
Platelet Count: 305 10*3/uL (ref 150–400)
RBC: 4.41 MIL/uL (ref 3.87–5.11)
RDW: 13 % (ref 11.5–15.5)
WBC Count: 8 10*3/uL (ref 4.0–10.5)
nRBC: 0 % (ref 0.0–0.2)

## 2021-06-29 LAB — PREGNANCY, URINE: Preg Test, Ur: NEGATIVE

## 2021-06-29 LAB — CMP (CANCER CENTER ONLY)
ALT: 29 U/L (ref 0–44)
AST: 27 U/L (ref 15–41)
Albumin: 4.4 g/dL (ref 3.5–5.0)
Alkaline Phosphatase: 94 U/L (ref 38–126)
Anion gap: 10 (ref 5–15)
BUN: 8 mg/dL (ref 6–20)
CO2: 23 mmol/L (ref 22–32)
Calcium: 9.9 mg/dL (ref 8.9–10.3)
Chloride: 105 mmol/L (ref 98–111)
Creatinine: 0.71 mg/dL (ref 0.44–1.00)
GFR, Estimated: >60 mL/min (ref >60)
Glucose, Bld: 110 mg/dL — ABNORMAL HIGH (ref 70–99)
Potassium: 4 mmol/L (ref 3.5–5.1)
Sodium: 138 mmol/L (ref 135–145)
Total Bilirubin: 0.4 mg/dL (ref 0.3–1.2)
Total Protein: 8.3 g/dL — ABNORMAL HIGH (ref 6.5–8.1)

## 2021-06-29 LAB — TSH: TSH: 1.404 u[IU]/mL (ref 0.308–3.960)

## 2021-06-29 MED ORDER — SODIUM CHLORIDE 0.9 % IV SOLN
1500.0000 mg | Freq: Once | INTRAVENOUS | Status: AC
  Administered 2021-06-29: 1500 mg via INTRAVENOUS
  Filled 2021-06-29: qty 30

## 2021-06-29 MED ORDER — SODIUM CHLORIDE 0.9 % IV SOLN: Freq: Once | INTRAVENOUS | Status: AC

## 2021-06-29 NOTE — Patient Instructions (Signed)
Steps to Quit Smoking Smoking tobacco is the leading cause of preventable death. It can affect almost every organ in the body. Smoking puts you and people around you at risk for many serious, long-lasting (chronic) diseases. Quitting smoking can be hard, but it is one of the best things that you can do for your health. It is never too late to quit. How do I get ready to quit? When you decide to quit smoking, make a plan to help you succeed. Before you quit: Pick a date to quit. Set a date within the next 2 weeks to give you time to prepare. Write down the reasons why you are quitting. Keep this list in places where you will see it often. Tell your family, friends, and co-workers that you are quitting. Their support is important. Talk with your doctor about the choices that may help you quit. Find out if your health insurance will pay for these treatments. Know the people, places, things, and activities that make you want to smoke (triggers). Avoid them. What first steps can I take to quit smoking? Throw away all cigarettes at home, at work, and in your car. Throw away the things that you use when you smoke, such as ashtrays and lighters. Clean your car. Make sure to empty the ashtray. Clean your home, including curtains and carpets. What can I do to help me quit smoking? Talk with your doctor about taking medicines and seeing a counselor at the same time. You are more likely to succeed when you do both. If you are pregnant or breastfeeding, talk with your doctor about counseling or other ways to quit smoking. Do not take medicine to help you quit smoking unless your doctor tells you to do so. To quit smoking: Quit right away Quit smoking totally, instead of slowly cutting back on how much you smoke over a period of time. Go to counseling. You are more likely to quit if you go to counseling sessions regularly. Take medicine You may take medicines to help you quit. Some medicines need a  prescription, and some you can buy over-the-counter. Some medicines may contain a drug called nicotine to replace the nicotine in cigarettes. Medicines may: Help you to stop having the desire to smoke (cravings). Help to stop the problems that come when you stop smoking (withdrawal symptoms). Your doctor may ask you to use: Nicotine patches, gum, or lozenges. Nicotine inhalers or sprays. Non-nicotine medicine that is taken by mouth. Find resources Find resources and other ways to help you quit smoking and remain smoke-free after you quit. These resources are most helpful when you use them often. They include: Online chats with a counselor. Phone quitlines. Printed self-help materials. Support groups or group counseling. Text messaging programs. Mobile phone apps. Use apps on your mobile phone or tablet that can help you stick to your quit plan. There are many free apps for mobile phones and tablets as well as websites. Examples include Quit Guide from the CDC and smokefree.gov  What things can I do to make it easier to quit?  Talk to your family and friends. Ask them to support and encourage you. Call a phone quitline (1-800-QUIT-NOW), reach out to support groups, or work with a counselor. Ask people who smoke to not smoke around you. Avoid places that make you want to smoke, such as: Bars. Parties. Smoke-break areas at work. Spend time with people who do not smoke. Lower the stress in your life. Stress can make you want to   smoke. Try these things to help your stress: Getting regular exercise. Doing deep-breathing exercises. Doing yoga. Meditating. Doing a body scan. To do this, close your eyes, focus on one area of your body at a time from head to toe. Notice which parts of your body are tense. Try to relax the muscles in those areas. How will I feel when I quit smoking? Day 1 to 3 weeks Within the first 24 hours, you may start to have some problems that come from quitting tobacco.  These problems are very bad 2-3 days after you quit, but they do not often last for more than 2-3 weeks. You may get these symptoms: Mood swings. Feeling restless, nervous, angry, or annoyed. Trouble concentrating. Dizziness. Strong desire for high-sugar foods and nicotine. Weight gain. Trouble pooping (constipation). Feeling like you may vomit (nausea). Coughing or a sore throat. Changes in how the medicines that you take for other issues work in your body. Depression. Trouble sleeping (insomnia). Week 3 and afterward After the first 2-3 weeks of quitting, you may start to notice more positive results, such as: Better sense of smell and taste. Less coughing and sore throat. Slower heart rate. Lower blood pressure. Clearer skin. Better breathing. Fewer sick days. Quitting smoking can be hard. Do not give up if you fail the first time. Some people need to try a few times before they succeed. Do your best to stick to your quit plan, and talk with your doctor if you have any questions or concerns. Summary Smoking tobacco is the leading cause of preventable death. Quitting smoking can be hard, but it is one of the best things that you can do for your health. When you decide to quit smoking, make a plan to help you succeed. Quit smoking right away, not slowly over a period of time. When you start quitting, seek help from your doctor, family, or friends. This information is not intended to replace advice given to you by your health care provider. Make sure you discuss any questions you have with your health care provider. Document Revised: 02/06/2021 Document Reviewed: 08/19/2018 Elsevier Patient Education  2022 [employer] Inc.  

## 2021-06-29 NOTE — Progress Notes (Signed)
[place] Telephone:([phone]   [phone]  OFFICE PROGRESS NOTE  Koirala, Dibas, MD [address] [place]  [address]  DIAGNOSIS: stage IIIC (T4, N3, M0) non-small cell lung cancer, adenocarcinoma presented with large right upper lobe lung mass with associated narrowing and occlusion of the right pulmonary artery superior trunk as well as narrowing of the interlobular right pulmonary artery and encasement and narrowing of the right upper lobe bronchus in addition to right hilar and subcarinal lymphadenopathy and suspicious lesion in the liver.  This was diagnosed in August 2022.  Molecular studies by [employer] 360 showed no actionable mutations.  PRIOR THERAPY: Concurrent chemoradiation with weekly carboplatin for AUC of 2 and paclitaxel 45 Mg/M2.  Status post 6 cycles.  Last dose was given on 03/02/2021.  CURRENT THERAPY: Consolidation treatment with immunotherapy with Imfinzi 1500 Mg IV every 4 weeks.  First dose April 06, 2021.  Status post 3 cycles.  INTERVAL HISTORY: Abigail Clark 46 y.o. female returns to the clinic today for follow-up visit.  The patient is feeling fine today with no concerning complaints.  She had fever at the end of December that lasted for 1 day and improved.  She denied having any current chest pain, shortness of breath, cough or hemoptysis.  She denied having any fever or chills.  She has no nausea, vomiting, diarrhea or constipation.  She denied having any headache or visual changes.  She has no significant weight loss or night sweats.  She continues to tolerate her treatment with immunotherapy with Imfinzi fairly well.  The patient is here today for evaluation with repeat CT scan of the chest for restaging of her disease before starting cycle #4.  MEDICAL HISTORY: Past Medical History:  Diagnosis Date   Cataract    RIGHT EYE   Eczema    Exotropia of right eye 11/2014   History of radiation therapy     Right Lung- 01/26/21-03/09/21- Dr. Gery Pray   Irritable bowel syndrome (IBS)    no current med.   Lactose intolerance     ALLERGIES:  is allergic to penicillins.  MEDICATIONS:  Current Outpatient Medications  Medication Sig Dispense Refill   cholecalciferol (VITAMIN D3) 25 MCG (1000 UNIT) tablet Take 1,000 Units by mouth daily.     Multiple Vitamin (MULTIVITAMIN) tablet Take 1 tablet by mouth daily.     omeprazole (PRILOSEC) 20 MG capsule Take 20 mg by mouth 2 (two) times daily as needed (indigestion).     oxyCODONE-acetaminophen (PERCOCET/ROXICET) 5-325 MG tablet Take 1 tablet by mouth every 4 (four) hours as needed for severe pain. (Patient not taking: Reported on 04/02/2021) 30 tablet 0   prochlorperazine (COMPAZINE) 10 MG tablet Take 1 tablet (10 mg total) by mouth every 6 (six) hours as needed for nausea or vomiting. (Patient not taking: Reported on 04/02/2021) 30 tablet 0   vitamin E 1000 UNIT capsule Take 1,000 Units by mouth daily.     No current facility-administered medications for this visit.    SURGICAL HISTORY:  Past Surgical History:  Procedure Laterality Date   BRONCHIAL BIOPSY  01/12/2021   Procedure: BRONCHIAL BIOPSIES;  Surgeon: Spero Geralds, MD;  Location: [facility] ENDOSCOPY;  Service: Pulmonary;;   BRONCHIAL NEEDLE ASPIRATION BIOPSY  01/12/2021   Procedure: BRONCHIAL NEEDLE ASPIRATION BIOPSIES;  Surgeon: Spero Geralds, MD;  Location: [facility] ENDOSCOPY;  Service: Pulmonary;;   CATARACT PEDIATRIC Right 1986   LEEP  05/20/2006   LIPOSUCTION  10/2014   abd. - local anes.   STRABISMUS SURGERY Right 11/29/2014   Procedure: REPAIR STRABISMUS RIGHT EYE ;  Surgeon: Everitt Amber, MD;  Location: [facility];  Service: Ophthalmology;  Laterality: Right;   VIDEO BRONCHOSCOPY WITH ENDOBRONCHIAL ULTRASOUND N/A 01/12/2021   Procedure: VIDEO BRONCHOSCOPY WITH ENDOBRONCHIAL ULTRASOUND;  Surgeon: Spero Geralds, MD;  Location: [facility] ENDOSCOPY;  Service: Pulmonary;  Laterality:  N/A;   WISDOM TOOTH EXTRACTION      REVIEW OF SYSTEMS:  Constitutional: positive for fatigue Eyes: negative Ears, nose, mouth, throat, and face: negative Respiratory: negative Cardiovascular: negative Gastrointestinal: negative Genitourinary:negative Integument/breast: negative Hematologic/lymphatic: negative Musculoskeletal:negative Neurological: negative Behavioral/Psych: negative Endocrine: negative Allergic/Immunologic: negative   PHYSICAL EXAMINATION: General appearance: alert, cooperative, fatigued, and no distress Head: Normocephalic, without obvious abnormality, atraumatic Neck: no adenopathy, no JVD, supple, symmetrical, trachea midline, and thyroid not enlarged, symmetric, no tenderness/mass/nodules Lymph nodes: Cervical, supraclavicular, and axillary nodes normal. Resp: clear to auscultation bilaterally Back: symmetric, no curvature. ROM normal. No CVA tenderness. Cardio: regular rate and rhythm, S1, S2 normal, no murmur, click, rub or gallop GI: soft, non-tender; bowel sounds normal; no masses,  no organomegaly Extremities: extremities normal, atraumatic, no cyanosis or edema Neurologic: Alert and oriented X 3, normal strength and tone. Normal symmetric reflexes. Normal coordination and gait  ECOG PERFORMANCE STATUS: 1 - Symptomatic but completely ambulatory  Blood pressure 120/80, pulse (!) 103, temperature (!) 97.2 F (36.2 C), temperature source Tympanic, resp. rate 20, height 5\' 7"  (1.702 m), weight 198 lb 14.4 oz (90.2 kg), SpO2 97 %.  LABORATORY DATA: Lab Results  Component Value Date   WBC 8.0 06/29/2021   HGB 13.3 06/29/2021   HCT 39.9 06/29/2021   MCV 90.5 06/29/2021   PLT 305 06/29/2021      Chemistry      Component Value Date/Time   NA 139 06/01/2021 1103   K 3.8 06/01/2021 1103   CL 107 06/01/2021 1103   CO2 22 06/01/2021 1103   BUN 12 06/01/2021 1103   CREATININE 0.77 06/01/2021 1103      Component Value Date/Time   CALCIUM 9.6  06/01/2021 1103   ALKPHOS 99 06/01/2021 1103   AST 23 06/01/2021 1103   ALT 19 06/01/2021 1103   BILITOT 0.2 (L) 06/01/2021 1103       RADIOGRAPHIC STUDIES: CT Chest W Contrast  Result Date: 06/25/2021 CLINICAL DATA:  Non-small cell lung cancer staging EXAM: CT CHEST WITH CONTRAST TECHNIQUE: Multidetector CT imaging of the chest was performed during intravenous contrast administration. RADIATION DOSE REDUCTION: This exam was performed according to the departmental dose-optimization program which includes automated exposure control, adjustment of the mA and/or kV according to patient size and/or use of iterative reconstruction technique. CONTRAST:  90mL OMNIPAQUE IOHEXOL 350 MG/ML SOLN COMPARISON:  Multiple priors, most recent chest CT dated March 27, 2021 FINDINGS: Cardiovascular: Normal heart size. No pericardial effusion. No visible coronary artery calcifications or significant atherosclerotic disease of the thoracic aorta. Mediastinum/Nodes: Unchanged bilateral thyroid nodules. Esophagus is unremarkable. Interval decreased size of paratracheal and right hilar lymph nodes. Reference right paratracheal lymph node measuring 8 mm in short axis on series 4, image 47, previously 10.0 cm. Bulky right hilar lymph node measures 2.6 x 2.5 cm on image 61, previously 3.3 x 2.8 cm. Lungs/Pleura: Central airways are patent. Centrilobular emphysema. Decreased size of cavitary right upper lobe mass, measuring 7.2 x 5.3 cm in maximum axial dimension, previously 8.6 x 6.2 cm. New adjacent linear nodular opacities are  seen in the right upper lobe and superior portion of the right lower lobe. Reference solid nodule of the right upper lobe measuring 7 mm on series 5, image 64. Solid pulmonary nodule of the left upper lobe measuring 7 x 6 mm on series 5, image 50, increased in size and density when compared with prior exam, previously a ground-glass nodule that measured up to 6 mm. Additional previously seen small  nodules are stable. Reference left upper lobe nodule measuring 4 mm on series 5, image 64. Upper Abdomen: Scattered low-attenuation liver lesions are unchanged compared with prior exam and likely simple cysts. No acute abnormality. Musculoskeletal: No chest wall abnormality. No acute or significant osseous findings. IMPRESSION: 1. Interval decreased size of cavitary right upper lobe mass. New adjacent linear nodular opacities are seen in the right upper lobe and superior portion of the right lower lobe, possibly post treatment changes or due to infectious/inflammatory etiology. Consider short-term follow-up for further evaluation. 2. Interval decreased size of enlarged paratracheal and right hilar lymph nodes. 3. Increased size and density of a subcentimeter solid left upper lobe pulmonary nodule, now measuring up to 7 mm. Recommend attention on follow-up. 4. Emphysema (ICD10-J43.9). Electronically Signed   By: Yetta Glassman M.D.   On: 06/25/2021 10:16    ASSESSMENT AND PLAN: This is a very pleasant 46 years old African-American female diagnosed with a stage IIIC (T4, N3, M0) non-small cell lung cancer, adenocarcinoma presented with large right upper lobe lung mass with narrowing and occlusion of the right pulmonary artery superior trunk as well as narrowing of the interlobular right pulmonary artery and encasement and narrowing of the right upper lobe bronchus in addition to right hilar and mediastinal lymphadenopathy and suspicious liver lesion. The patient had molecular studies by [employer] that showed no actionable mutation. She had a PET scan performed last week and that showed no evidence for metastatic disease outside the chest but there was evidence of metastatic disease to the left supraclavicular area. The patient underwent a course of concurrent chemoradiation with weekly carboplatin for AUC of 2 and paclitaxel 45 Mg/M2.  Status post 6 cycles.  Last dose was given on 03/02/2021.  Follow-up CT  scan of the chest after the induction treatment showed significant decrease in the size of the right lung mass.  The patient is currently undergoing consolidation treatment with immunotherapy with Imfinzi 1500 Mg IV every 4 weeks status post 3 cycles. The patient is tolerating her current treatment with immunotherapy fairly well with no concerning adverse effects. She had repeat CT scan of the chest performed recently.  I personally and independently reviewed the scan images and discussed the result and showed the images to the patient today. Her scan showed further improvement of her disease except for new 0.7 cm left upper lobe nodule that need close monitoring on the upcoming imaging studies. I recommended for the patient to continue her current treatment and she will proceed with cycle #4 today. The patient will come back for follow-up visit in 4 weeks for evaluation before the next cycle of his treatment. The patient was advised to call immediately if she has any concerning symptoms in the interval. The patient voices understanding of current disease status and treatment options and is in agreement with the current care plan.  All questions were answered. The patient knows to call the clinic with any problems, questions or concerns. We can certainly see the patient much sooner if necessary.   Disclaimer: This note was dictated  with voice recognition software. Similar sounding words can inadvertently be transcribed and may not be corrected upon review.

## 2021-06-29 NOTE — Patient Instructions (Signed)
[facility]  Discharge Instructions: °Thank you for choosing [facility] to provide your oncology and hematology care.  ° °If you have a lab appointment with the Cancer Center, please go directly to the Cancer Center and check in at the registration area. °  °Wear comfortable clothing and clothing appropriate for easy access to any Portacath or PICC line.  ° °We strive to give you quality time with your provider. You may need to reschedule your appointment if you arrive late (15 or more minutes).  Arriving late affects you and other patients whose appointments are after yours.  Also, if you miss three or more appointments without notifying the office, you may be dismissed from the clinic at the provider’s discretion.    °  °For prescription refill requests, have your pharmacy contact our office and allow 72 hours for refills to be completed.   ° °Today you received the following chemotherapy and/or immunotherapy agents :  Durvalumab °    °  °To help prevent nausea and vomiting after your treatment, we encourage you to take your nausea medication as directed. ° °BELOW ARE SYMPTOMS THAT SHOULD BE REPORTED IMMEDIATELY: °*FEVER GREATER THAN 100.4 F (38 °C) OR HIGHER °*CHILLS OR SWEATING °*NAUSEA AND VOMITING THAT IS NOT CONTROLLED WITH YOUR NAUSEA MEDICATION °*UNUSUAL SHORTNESS OF BREATH °*UNUSUAL BRUISING OR BLEEDING °*URINARY PROBLEMS (pain or burning when urinating, or frequent urination) °*BOWEL PROBLEMS (unusual diarrhea, constipation, pain near the anus) °TENDERNESS IN MOUTH AND THROAT WITH OR WITHOUT PRESENCE OF ULCERS (sore throat, sores in mouth, or a toothache) °UNUSUAL RASH, SWELLING OR PAIN  °UNUSUAL VAGINAL DISCHARGE OR ITCHING  ° °Items with * indicate a potential emergency and should be followed up as soon as possible or go to the Emergency Department if any problems should occur. ° °Please show the CHEMOTHERAPY ALERT CARD or IMMUNOTHERAPY ALERT CARD at  check-in to the Emergency Department and triage nurse. ° °Should you have questions after your visit or need to cancel or reschedule your appointment, please contact [facility]  Dept: [phone]  and follow the prompts.  Office hours are 8:00 a.m. to 4:30 p.m. Monday - Friday. Please note that voicemails left after 4:00 p.m. may not be returned until the following business day.  We are closed weekends and major holidays. You have access to a nurse at all times for urgent questions. Please call the main number to the clinic Dept: [phone] and follow the prompts. ° ° °For any non-urgent questions, you may also contact your provider using MyChart. We now offer e-Visits for anyone 18 and older to request care online for non-urgent symptoms. For details visit [email or web address]. °  °Also download the MyChart app! Go to the app store, search "MyChart", open the app, select [facility], and log in with your MyChart username and password. ° °Due to Covid, a mask is required upon entering the hospital/clinic. If you do not have a mask, one will be given to you upon arrival. For doctor visits, patients may have 1 support person aged 18 or older with them. For treatment visits, patients cannot have anyone with them due to current Covid guidelines and our immunocompromised population.  ° °

## 2021-07-10 ENCOUNTER — Encounter (HOSPITAL_COMMUNITY): Payer: Self-pay

## 2021-08-03 ENCOUNTER — Inpatient Hospital Stay: Payer: Commercial Managed Care - HMO | Attending: Internal Medicine

## 2021-08-03 ENCOUNTER — Encounter: Payer: Self-pay | Admitting: Internal Medicine

## 2021-08-03 ENCOUNTER — Inpatient Hospital Stay: Payer: Commercial Managed Care - HMO | Admitting: Internal Medicine

## 2021-08-03 ENCOUNTER — Ambulatory Visit: Payer: 59

## 2021-08-03 DIAGNOSIS — Z5112 Encounter for antineoplastic immunotherapy: Secondary | ICD-10-CM | POA: Insufficient documentation

## 2021-08-03 DIAGNOSIS — C3411 Malignant neoplasm of upper lobe, right bronchus or lung: Secondary | ICD-10-CM | POA: Insufficient documentation

## 2021-08-03 DIAGNOSIS — Z79899 Other long term (current) drug therapy: Secondary | ICD-10-CM | POA: Insufficient documentation

## 2021-08-03 NOTE — Progress Notes (Signed)
Enrolled patient in [place] program for [place].  Patient approved for $26,000 08/03/21 - 08/03/22 leaving her with more than likely $0 copay after insurance pays and maximum amount of $100.  A copy of the approval letter will be mailed to patient for her records only.  A copy will be given to [facility] for billing/copay claim submissions.  Attempted to reach patient via phone, however her voicemail was full.  My card to be given at registration for any additional financial questions or concerns.

## 2021-08-05 ENCOUNTER — Telehealth: Payer: Self-pay | Admitting: Internal Medicine

## 2021-08-05 NOTE — Telephone Encounter (Signed)
Sch per 2/22 inbasket, patient r/s, unable to leave msg

## 2021-08-10 ENCOUNTER — Encounter: Payer: Self-pay | Admitting: Internal Medicine

## 2021-08-10 ENCOUNTER — Inpatient Hospital Stay: Payer: Commercial Managed Care - HMO

## 2021-08-10 ENCOUNTER — Other Ambulatory Visit: Payer: Self-pay

## 2021-08-10 ENCOUNTER — Other Ambulatory Visit: Payer: Managed Care, Other (non HMO)

## 2021-08-10 ENCOUNTER — Inpatient Hospital Stay (HOSPITAL_BASED_OUTPATIENT_CLINIC_OR_DEPARTMENT_OTHER): Payer: Commercial Managed Care - HMO | Admitting: Internal Medicine

## 2021-08-10 VITALS — BP 123/69 | HR 94 | Temp 97.9°F | Resp 18 | Ht 67.0 in | Wt 205.5 lb

## 2021-08-10 DIAGNOSIS — C3411 Malignant neoplasm of upper lobe, right bronchus or lung: Secondary | ICD-10-CM | POA: Diagnosis present

## 2021-08-10 DIAGNOSIS — Z5112 Encounter for antineoplastic immunotherapy: Secondary | ICD-10-CM | POA: Diagnosis not present

## 2021-08-10 DIAGNOSIS — C3491 Malignant neoplasm of unspecified part of right bronchus or lung: Secondary | ICD-10-CM | POA: Diagnosis not present

## 2021-08-10 DIAGNOSIS — Z5111 Encounter for antineoplastic chemotherapy: Secondary | ICD-10-CM

## 2021-08-10 DIAGNOSIS — Z79899 Other long term (current) drug therapy: Secondary | ICD-10-CM | POA: Diagnosis not present

## 2021-08-10 LAB — CBC WITH DIFFERENTIAL (CANCER CENTER ONLY)
Abs Immature Granulocytes: 0.03 10*3/uL (ref 0.00–0.07)
Basophils Absolute: 0 10*3/uL (ref 0.0–0.1)
Basophils Relative: 0 %
Eosinophils Absolute: 0.2 10*3/uL (ref 0.0–0.5)
Eosinophils Relative: 3 %
HCT: 39.1 % (ref 36.0–46.0)
Hemoglobin: 13.4 g/dL (ref 12.0–15.0)
Immature Granulocytes: 0 %
Lymphocytes Relative: 11 %
Lymphs Abs: 1.1 10*3/uL (ref 0.7–4.0)
MCH: 29.6 pg (ref 26.0–34.0)
MCHC: 34.3 g/dL (ref 30.0–36.0)
MCV: 86.5 fL (ref 80.0–100.0)
Monocytes Absolute: 0.6 10*3/uL (ref 0.1–1.0)
Monocytes Relative: 6 %
Neutro Abs: 7.6 10*3/uL (ref 1.7–7.7)
Neutrophils Relative %: 80 %
Platelet Count: 271 10*3/uL (ref 150–400)
RBC: 4.52 MIL/uL (ref 3.87–5.11)
RDW: 13.4 % (ref 11.5–15.5)
WBC Count: 9.5 10*3/uL (ref 4.0–10.5)
nRBC: 0 % (ref 0.0–0.2)

## 2021-08-10 LAB — CMP (CANCER CENTER ONLY)
ALT: 62 U/L — ABNORMAL HIGH (ref 0–44)
AST: 67 U/L — ABNORMAL HIGH (ref 15–41)
Albumin: 4.4 g/dL (ref 3.5–5.0)
Alkaline Phosphatase: 80 U/L (ref 38–126)
Anion gap: 7 (ref 5–15)
BUN: 14 mg/dL (ref 6–20)
CO2: 25 mmol/L (ref 22–32)
Calcium: 10.1 mg/dL (ref 8.9–10.3)
Chloride: 107 mmol/L (ref 98–111)
Creatinine: 0.68 mg/dL (ref 0.44–1.00)
GFR, Estimated: >60 mL/min (ref >60)
Glucose, Bld: 114 mg/dL — ABNORMAL HIGH (ref 70–99)
Potassium: 4 mmol/L (ref 3.5–5.1)
Sodium: 139 mmol/L (ref 135–145)
Total Bilirubin: 0.3 mg/dL (ref 0.3–1.2)
Total Protein: 8.1 g/dL (ref 6.5–8.1)

## 2021-08-10 LAB — PREGNANCY, URINE: Preg Test, Ur: NEGATIVE

## 2021-08-10 LAB — TSH: TSH: 0.405 u[IU]/mL (ref 0.308–3.960)

## 2021-08-10 MED ORDER — SODIUM CHLORIDE 0.9 % IV SOLN: Freq: Once | INTRAVENOUS | Status: AC

## 2021-08-10 MED ORDER — SODIUM CHLORIDE 0.9 % IV SOLN
1500.0000 mg | Freq: Once | INTRAVENOUS | Status: AC
  Administered 2021-08-10: 1500 mg via INTRAVENOUS
  Filled 2021-08-10: qty 30

## 2021-08-10 NOTE — Patient Instructions (Signed)
[facility]  Discharge Instructions: Thank you for choosing [facility] to provide your oncology and hematology care.   If you have a lab appointment with the Cancer Center, please go directly to the Cancer Center and check in at the registration area.   Wear comfortable clothing and clothing appropriate for easy access to any Portacath or PICC line.   We strive to give you quality time with your provider. You may need to reschedule your appointment if you arrive late (15 or more minutes).  Arriving late affects you and other patients whose appointments are after yours.  Also, if you miss three or more appointments without notifying the office, you may be dismissed from the clinic at the provider's discretion.      For prescription refill requests, have your pharmacy contact our office and allow 72 hours for refills to be completed.    Today you received the following chemotherapy and/or immunotherapy agents Imfinzi      To help prevent nausea and vomiting after your treatment, we encourage you to take your nausea medication as directed.  BELOW ARE SYMPTOMS THAT SHOULD BE REPORTED IMMEDIATELY: *FEVER GREATER THAN 100.4 F (38 C) OR HIGHER *CHILLS OR SWEATING *NAUSEA AND VOMITING THAT IS NOT CONTROLLED WITH YOUR NAUSEA MEDICATION *UNUSUAL SHORTNESS OF BREATH *UNUSUAL BRUISING OR BLEEDING *URINARY PROBLEMS (pain or burning when urinating, or frequent urination) *BOWEL PROBLEMS (unusual diarrhea, constipation, pain near the anus) TENDERNESS IN MOUTH AND THROAT WITH OR WITHOUT PRESENCE OF ULCERS (sore throat, sores in mouth, or a toothache) UNUSUAL RASH, SWELLING OR PAIN  UNUSUAL VAGINAL DISCHARGE OR ITCHING   Items with * indicate a potential emergency and should be followed up as soon as possible or go to the Emergency Department if any problems should occur.  Please show the CHEMOTHERAPY ALERT CARD or IMMUNOTHERAPY ALERT CARD at check-in to the  Emergency Department and triage nurse.  Should you have questions after your visit or need to cancel or reschedule your appointment, please contact [facility]  Dept: [phone]  and follow the prompts.  Office hours are 8:00 a.m. to 4:30 p.m. Monday - Friday. Please note that voicemails left after 4:00 p.m. may not be returned until the following business day.  We are closed weekends and major holidays. You have access to a nurse at all times for urgent questions. Please call the main number to the clinic Dept: [phone] and follow the prompts.   For any non-urgent questions, you may also contact your provider using MyChart. We now offer e-Visits for anyone 18 and older to request care online for non-urgent symptoms. For details visit [email or web address].   Also download the MyChart app! Go to the app store, search "MyChart", open the app, select , and log in with your MyChart username and password.  Due to Covid, a mask is required upon entering the hospital/clinic. If you do not have a mask, one will be given to you upon arrival. For doctor visits, patients may have 1 support person aged 18 or older with them. For treatment visits, patients cannot have anyone with them due to current Covid guidelines and our immunocompromised population.   

## 2021-08-10 NOTE — Patient Instructions (Signed)
Steps to Quit Smoking Smoking tobacco is the leading cause of preventable death. It can affect almost every organ in the body. Smoking puts you and people around you at risk for many serious, long-lasting (chronic) diseases. Quitting smoking can be hard, but it is one of the best things that you can do for your health. It is never too late to quit. How do I get ready to quit? When you decide to quit smoking, make a plan to help you succeed. Before you quit: Pick a date to quit. Set a date within the next 2 weeks to give you time to prepare. Write down the reasons why you are quitting. Keep this list in places where you will see it often. Tell your family, friends, and co-workers that you are quitting. Their support is important. Talk with your doctor about the choices that may help you quit. Find out if your health insurance will pay for these treatments. Know the people, places, things, and activities that make you want to smoke (triggers). Avoid them. What first steps can I take to quit smoking? Throw away all cigarettes at home, at work, and in your car. Throw away the things that you use when you smoke, such as ashtrays and lighters. Clean your car. Make sure to empty the ashtray. Clean your home, including curtains and carpets. What can I do to help me quit smoking? Talk with your doctor about taking medicines and seeing a counselor at the same time. You are more likely to succeed when you do both. If you are pregnant or breastfeeding, talk with your doctor about counseling or other ways to quit smoking. Do not take medicine to help you quit smoking unless your doctor tells you to do so. To quit smoking: Quit right away Quit smoking totally, instead of slowly cutting back on how much you smoke over a period of time. Go to counseling. You are more likely to quit if you go to counseling sessions regularly. Take medicine You may take medicines to help you quit. Some medicines need a  prescription, and some you can buy over-the-counter. Some medicines may contain a drug called nicotine to replace the nicotine in cigarettes. Medicines may: Help you to stop having the desire to smoke (cravings). Help to stop the problems that come when you stop smoking (withdrawal symptoms). Your doctor may ask you to use: Nicotine patches, gum, or lozenges. Nicotine inhalers or sprays. Non-nicotine medicine that is taken by mouth. Find resources Find resources and other ways to help you quit smoking and remain smoke-free after you quit. These resources are most helpful when you use them often. They include: Online chats with a counselor. Phone quitlines. Printed self-help materials. Support groups or group counseling. Text messaging programs. Mobile phone apps. Use apps on your mobile phone or tablet that can help you stick to your quit plan. There are many free apps for mobile phones and tablets as well as websites. Examples include Quit Guide from the CDC and smokefree.gov  What things can I do to make it easier to quit?  Talk to your family and friends. Ask them to support and encourage you. Call a phone quitline (1-800-QUIT-NOW), reach out to support groups, or work with a counselor. Ask people who smoke to not smoke around you. Avoid places that make you want to smoke, such as: Bars. Parties. Smoke-break areas at work. Spend time with people who do not smoke. Lower the stress in your life. Stress can make you want to   smoke. Try these things to help your stress: Getting regular exercise. Doing deep-breathing exercises. Doing yoga. Meditating. Doing a body scan. To do this, close your eyes, focus on one area of your body at a time from head to toe. Notice which parts of your body are tense. Try to relax the muscles in those areas. How will I feel when I quit smoking? Day 1 to 3 weeks Within the first 24 hours, you may start to have some problems that come from quitting tobacco.  These problems are very bad 2-3 days after you quit, but they do not often last for more than 2-3 weeks. You may get these symptoms: Mood swings. Feeling restless, nervous, angry, or annoyed. Trouble concentrating. Dizziness. Strong desire for high-sugar foods and nicotine. Weight gain. Trouble pooping (constipation). Feeling like you may vomit (nausea). Coughing or a sore throat. Changes in how the medicines that you take for other issues work in your body. Depression. Trouble sleeping (insomnia). Week 3 and afterward After the first 2-3 weeks of quitting, you may start to notice more positive results, such as: Better sense of smell and taste. Less coughing and sore throat. Slower heart rate. Lower blood pressure. Clearer skin. Better breathing. Fewer sick days. Quitting smoking can be hard. Do not give up if you fail the first time. Some people need to try a few times before they succeed. Do your best to stick to your quit plan, and talk with your doctor if you have any questions or concerns. Summary Smoking tobacco is the leading cause of preventable death. Quitting smoking can be hard, but it is one of the best things that you can do for your health. When you decide to quit smoking, make a plan to help you succeed. Quit smoking right away, not slowly over a period of time. When you start quitting, seek help from your doctor, family, or friends. This information is not intended to replace advice given to you by your health care provider. Make sure you discuss any questions you have with your health care provider. Document Revised: 02/06/2021 Document Reviewed: 08/19/2018 Elsevier Patient Education  2022 [employer] Inc.  

## 2021-08-10 NOTE — Progress Notes (Signed)
[facility] [phone]   [phone]  OFFICE PROGRESS NOTE  Koirala, Dibas, MD [address] [place] [address]  DIAGNOSIS: stage IIIC (T4, N3, M0) non-small cell lung cancer, adenocarcinoma presented with large right upper lobe lung mass with associated narrowing and occlusion of the right pulmonary artery superior trunk as well as narrowing of the interlobular right pulmonary artery and encasement and narrowing of the right upper lobe bronchus in addition to right hilar and subcarinal lymphadenopathy and suspicious lesion in the liver.  This was diagnosed in August 2022.  Molecular studies by [employer] showed no actionable mutations.  PRIOR THERAPY: Concurrent chemoradiation with weekly carboplatin for AUC of 2 and paclitaxel 45 Mg/M2.  Status post 6 cycles.  Last dose was given on 03/02/2021.  CURRENT THERAPY: Consolidation treatment with immunotherapy with Imfinzi 1500 Mg IV every 4 weeks.  First dose April 06, 2021.  Status post 4 cycles.  INTERVAL HISTORY: Abigail Clark 46 y.o. female returns to the clinic today for follow-up visit.  The patient is feeling fine today with no concerning complaints.  Her treatment was delayed by 1 week because of change in her insurance and needed new authorization.  The patient denied having any current chest pain, shortness of breath, cough or hemoptysis she continues to have arthralgia in the shoulders bilaterally.  She denied having any weight loss or night sweats.  She has no headache or visual changes.  She denied having any fever or chills.  She has no nausea, vomiting, diarrhea or constipation.  She has no skin rash or pruritus.  She is here today for evaluation before starting cycle #5 of her treatment.  MEDICAL HISTORY: Past Medical History:  Diagnosis Date   Cataract    RIGHT EYE   Eczema    Exotropia of right eye 11/2014   History of radiation therapy    Right Lung-  01/26/21-03/09/21- Dr. Gery Pray   Irritable bowel syndrome (IBS)    no current med.   Lactose intolerance     ALLERGIES:  is allergic to penicillins.  MEDICATIONS:  Current Outpatient Medications  Medication Sig Dispense Refill   cholecalciferol (VITAMIN D3) 25 MCG (1000 UNIT) tablet Take 1,000 Units by mouth daily.     Multiple Vitamin (MULTIVITAMIN) tablet Take 1 tablet by mouth daily.     omeprazole (PRILOSEC) 20 MG capsule Take 20 mg by mouth 2 (two) times daily as needed (indigestion).     prochlorperazine (COMPAZINE) 10 MG tablet Take 1 tablet (10 mg total) by mouth every 6 (six) hours as needed for nausea or vomiting. (Patient not taking: Reported on 04/02/2021) 30 tablet 0   vitamin E 1000 UNIT capsule Take 1,000 Units by mouth daily.     No current facility-administered medications for this visit.    SURGICAL HISTORY:  Past Surgical History:  Procedure Laterality Date   BRONCHIAL BIOPSY  01/12/2021   Procedure: BRONCHIAL BIOPSIES;  Surgeon: Spero Geralds, MD;  [facility] END[facility]COPY;  Service: Pulmonary;;   BRONCHIAL NEEDLE ASPIRATION BIOPSY  01/12/2021   Procedure: BRONCHIAL NEEDLE ASPIRATION BIOPSIES;  Surgeon: Spero Geralds, MD;  Location: [facility] ENDOSCOPY;  Service: Pulmonary;;   CATARACT PEDIATRIC Right 1986   LEEP  05/20/2006   LIPOSUCTION  10/2014   abd. - local anes.   STRABISMUS SURGERY Right 11/29/2014   Procedure: REPAIR STRABISMUS RIGHT EYE ;  Surgeon: Everitt Amber, MD;  Location: [facility];  Service: Ophthalmology;  Laterality: Right;   VIDEO BRONCHOSCOPY WITH ENDOBRONCHIAL ULTRASOUND N/A 01/12/2021   Procedure: VIDEO BRONCHOSCOPY WITH ENDOBRONCHIAL ULTRASOUND;  Surgeon: Spero Geralds, MD;  Location: [facility] ENDOSCOPY;  Service: Pulmonary;  Laterality: N/A;   WISDOM TOOTH EXTRACTION      REVIEW OF SYSTEMS:  A comprehensive review of systems was negative except for: Musculoskeletal: positive for arthralgias   PHYSICAL EXAMINATION: General  appearance: alert, cooperative, and no distress Head: Normocephalic, without obvious abnormality, atraumatic Neck: no adenopathy, no JVD, supple, symmetrical, trachea midline, and thyroid not enlarged, symmetric, no tenderness/mass/nodules Lymph nodes: Cervical, supraclavicular, and axillary nodes normal. Resp: clear to auscultation bilaterally Back: symmetric, no curvature. ROM normal. No CVA tenderness. Cardio: regular rate and rhythm, S1, S2 normal, no murmur, click, rub or gallop GI: soft, non-tender; bowel sounds normal; no masses,  no organomegaly Extremities: extremities normal, atraumatic, no cyanosis or edema  ECOG PERFORMANCE STATUS: 1 - Symptomatic but completely ambulatory  Blood pressure 123/69, pulse 94, temperature 97.9 F (36.6 C), temperature source Tympanic, resp. rate 18, height 5\' 7"  (1.702 m), weight 205 lb 8 oz (93.2 kg), SpO2 100 %.  LABORATORY DATA: Lab Results  Component Value Date   WBC 9.5 08/10/2021   HGB 13.4 08/10/2021   HCT 39.1 08/10/2021   MCV 86.5 08/10/2021   PLT 271 08/10/2021      Chemistry      Component Value Date/Time   NA 138 06/29/2021 1049   K 4.0 06/29/2021 1049   CL 105 06/29/2021 1049   CO2 23 06/29/2021 1049   BUN 8 06/29/2021 1049   CREATININE 0.71 06/29/2021 1049      Component Value Date/Time   CALCIUM 9.9 06/29/2021 1049   ALKPHOS 94 06/29/2021 1049   AST 27 06/29/2021 1049   ALT 29 06/29/2021 1049   BILITOT 0.4 06/29/2021 1049       RADIOGRAPHIC STUDIES: No results found.  ASSESSMENT AND PLAN: This is a very pleasant 46 years old African-American female diagnosed with a stage IIIC (T4, N3, M0) non-small cell lung cancer, adenocarcinoma presented with large right upper lobe lung mass with narrowing and occlusion of the right pulmonary artery superior trunk as well as narrowing of the interlobular right pulmonary artery and encasement and narrowing of the right upper lobe bronchus in addition to right hilar and  mediastinal lymphadenopathy and suspicious liver lesion. The patient had molecular studies by [employer] that showed no actionable mutation. She had a PET scan performed last week and that showed no evidence for metastatic disease outside the chest but there was evidence of metastatic disease to the left supraclavicular area. The patient underwent a course of concurrent chemoradiation with weekly carboplatin for AUC of 2 and paclitaxel 45 Mg/M2.  Status post 6 cycles.  Last dose was given on 03/02/2021.  Follow-up CT scan of the chest after the induction treatment showed significant decrease in the size of the right lung mass.  The patient is currently undergoing consolidation treatment with immunotherapy with Imfinzi 1500 Mg IV every 4 weeks status post 4 cycles. The patient has been tolerating her treatment well with no concerning adverse effects. I recommended for her to resume her treatment with cycle #5 today. I will see her back for follow-up visit in 4 weeks for evaluation before the next cycle of her treatment. She was advised to call immediately if she has any concerning symptoms in the interval. The patient voices understanding of current disease status and treatment options and is  in agreement with the current care plan.  All questions were answered. The patient knows to call the clinic with any problems, questions or concerns. We can certainly see the patient much sooner if necessary.   Disclaimer: This note was dictated with voice recognition software. Similar sounding words can inadvertently be transcribed and may not be corrected upon review.

## 2021-08-24 ENCOUNTER — Ambulatory Visit: Payer: 59 | Admitting: Internal Medicine

## 2021-08-24 ENCOUNTER — Other Ambulatory Visit: Payer: 59

## 2021-08-24 ENCOUNTER — Ambulatory Visit: Payer: 59

## 2021-09-03 NOTE — Progress Notes (Signed)
[name] ?OFFICE PROGRESS NOTE ? ?Lujean Amel, MD ?[address] ?[place] [address] ? ?DIAGNOSIS: Stage IIIC (T4, N3, M0) non-small cell lung cancer, adenocarcinoma presented with large right upper lobe lung mass with associated narrowing and occlusion of the right pulmonary artery superior trunk as well as narrowing of the interlobular right pulmonary artery and encasement and narrowing of the right upper lobe bronchus in addition to right hilar and subcarinal lymphadenopathy and suspicious lesion in the liver.  This was diagnosed in August 2022. ?  ?Molecular studies by [employer] showed no actionable mutations. ?  ? ?PRIOR THERAPY: Concurrent chemoradiation with weekly carboplatin for AUC of 2 and paclitaxel 45 Mg/M2.  Status post 6 cycles.  Last dose was given on 03/02/2021. ? ?CURRENT THERAPY: Consolidation treatment with immunotherapy with Imfinzi 1500 Mg IV every 4 weeks.  First dose April 06, 2021.  Status post 5 cycles. ? ?INTERVAL HISTORY: ?Abigail Clark 46 y.o. female returns to the clinic today for a follow-up visit.  The patient is feeling fairly well today without any concerning complaints.  She is currently undergoing consolidation immunotherapy with Imfinzi and she is tolerating it well without any concerning adverse side effects.  She had some itching on her chest in the interval since her last cycle of treatment.  She also reports some joint pain, particularly in her shoulders.  No rash visible today.  She has not tried taking anything for the joint pain or the itching.  She denies any fever, chills, night sweats, or unexplained weight loss.  Denies any nausea or vomiting.  She denies any unusual diarrhea or constipation; however, at baseline she does tend to alternate between diarrhea and constipation.  Denies any chest pain, hemoptysis, or shortness of breath.  She reports she sometimes has a cough at night but it will improve after drinking water.   She is here today for evaluation and repeat blood work before starting cycle #6. ? ? ?MEDICAL HISTORY: ?Past Medical History:  ?Diagnosis Date  ? Cataract   ? RIGHT EYE  ? Eczema   ? Exotropia of right eye 11/2014  ? History of radiation therapy   ? Right Lung- 01/26/21-03/09/21- Dr. Gery Pray  ? Irritable bowel syndrome (IBS)   ? no current med.  ? Lactose intolerance   ? ? ?ALLERGIES:  is allergic to penicillins. ? ?MEDICATIONS:  ?Current Outpatient Medications  ?Medication Sig Dispense Refill  ? cholecalciferol (VITAMIN D3) 25 MCG (1000 UNIT) tablet Take 1,000 Units by mouth daily.    ? Multiple Vitamin (MULTIVITAMIN) tablet Take 1 tablet by mouth daily.    ? omeprazole (PRILOSEC) 20 MG capsule Take 20 mg by mouth 2 (two) times daily as needed (indigestion).    ? prochlorperazine (COMPAZINE) 10 MG tablet Take 1 tablet (10 mg total) by mouth every 6 (six) hours as needed for nausea or vomiting. (Patient not taking: Reported on 04/02/2021) 30 tablet 0  ? vitamin E 1000 UNIT capsule Take 1,000 Units by mouth daily.    ? ?No current facility-administered medications for this visit.  ? ? ?SURGICAL HISTORY:  ?Past Surgical History:  ?Procedure Laterality Date  ? BRONCHIAL BIOPSY  01/12/2021  ? Procedure: BRONCHIAL BIOPSIES;  Surgeon: Spero Geralds, MD;  Location: [facility] ENDOSCOPY;  Service: Pulmonary;;  ? BRONCHIAL NEEDLE ASPIRATION BIOPSY  01/12/2021  ? Procedure: BRONCHIAL NEEDLE ASPIRATION BIOPSIES;  Surgeon: Spero Geralds, MD;  Location: [facility] ENDOSCOPY;  Service: Pulmonary;;  ? CATARACT PEDIATRIC  Right 1986  ? LEEP  05/20/2006  ? LIPOSUCTION  10/2014  ? abd. - local anes.  ? STRABISMUS SURGERY Right 11/29/2014  ? Procedure: REPAIR STRABISMUS RIGHT EYE ;  Surgeon: Everitt Amber, MD;  Location: [facility];  Service: Ophthalmology;  Laterality: Right;  ? VIDEO BRONCHOSCOPY WITH ENDOBRONCHIAL ULTRASOUND N/A 01/12/2021  ? Procedure: VIDEO BRONCHOSCOPY WITH ENDOBRONCHIAL ULTRASOUND;  Surgeon: Spero Geralds, MD;   Location: [facility] ENDOSCOPY;  Service: Pulmonary;  Laterality: N/A;  ? WISDOM TOOTH EXTRACTION    ? ? ?REVIEW OF SYSTEMS:   ?Review of Systems  ?Constitutional: Negative for appetite change, chills, fatigue, fever and unexpected weight change.  ?HENT: Negative for mouth sores, nosebleeds, sore throat and trouble swallowing.   ?Eyes: Negative for eye problems and icterus.  ?Respiratory: Positive for mild cough at night.  Negative for cough, hemoptysis, shortness of breath and wheezing.   ?Cardiovascular: Negative for chest pain and leg swelling.  ?Gastrointestinal: Negative for abdominal pain, constipation, diarrhea, nausea and vomiting.  ?Genitourinary: Negative for bladder incontinence, difficulty urinating, dysuria, frequency and hematuria.   ?Musculoskeletal: Positive for shoulder pain bilaterally.  Negative for back pain, gait problem, neck pain and neck stiffness.  ?Skin: Positive for itching on the chest without rash. ?Neurological: Negative for dizziness, extremity weakness, gait problem, headaches, light-headedness and seizures.  ?Hematological: Negative for adenopathy. Does not bruise/bleed easily.  ?Psychiatric/Behavioral: Negative for confusion, depression and sleep disturbance. The patient is not nervous/anxious.   ? ? ?PHYSICAL EXAMINATION:  ?Blood pressure 114/73, pulse 92, temperature 97.6 ?F (36.4 ?C), temperature source Tympanic, resp. rate 18, height 5\' 7"  (1.702 m), weight 204 lb 9 oz (92.8 kg), SpO2 100 %. ? ?ECOG PERFORMANCE STATUS: 1 ? ?Physical Exam  ?Constitutional: Oriented to person,  ?HENT:  ?Head: Normocephalic and atraumatic.  ?Mouth/Throat: Oropharynx is clear and moist. No oropharyngeal exudate.  ?Eyes: Conjunctivae are normal. Right eye exhibits no discharge. Left eye exhibits no discharge. No scleral icterus.  ?Neck: Normal range of motion. Neck supple.  ?Cardiovascular: Normal rate, regular rhythm, normal heart sounds and intact distal pulses.   ?Pulmonary/Chest: Effort normal and breath  sounds normal. No respiratory distress. No wheezes. No rales.  ?Abdominal: Soft. Bowel sounds are normal. Exhibits no distension and no mass. There is no tenderness.  ?Musculoskeletal: Normal range of motion. Exhibits no edema.  ?Lymphadenopathy:  ?  No cervical adenopathy.  ?Neurological: Alert and oriented to person, place, and time. Exhibits normal muscle tone. Gait normal. Coordination normal.  ?Skin: Skin is warm and dry. No rash noted. Not diaphoretic. No erythema. No pallor.  ?Psychiatric: Mood, memory and judgment normal.  ?Vitals reviewed. ? ?LABORATORY DATA: ?Lab Results  ?Component Value Date  ? WBC 8.9 09/07/2021  ? HGB 13.0 09/07/2021  ? HCT 39.0 09/07/2021  ? MCV 87.2 09/07/2021  ? PLT 270 09/07/2021  ? ? ?  Chemistry   ?   ?Component Value Date/Time  ? NA 138 09/07/2021 1008  ? K 4.2 09/07/2021 1008  ? CL 105 09/07/2021 1008  ? CO2 23 09/07/2021 1008  ? BUN 9 09/07/2021 1008  ? CREATININE 0.64 09/07/2021 1008  ?    ?Component Value Date/Time  ? CALCIUM 9.8 09/07/2021 1008  ? ALKPHOS 78 09/07/2021 1008  ? AST 41 09/07/2021 1008  ? ALT 40 09/07/2021 1008  ? BILITOT 0.5 09/07/2021 1008  ?  ? ? ? ?RADIOGRAPHIC STUDIES: ? ?No results found. ? ? ?ASSESSMENT/PLAN:  ?This is a very pleasant 46 year old African-American female diagnosed  with stage IIIc (T4, N3, M0) non-small cell lung cancer, adenocarcinoma.  The patient presented with a right upper lobe lung mass and narrowing and occlusion of the right pulmonary artery superior trunk as well as narrowing of the intralobular right pulmonary artery and encasement and narrowing of the right upper lobe bronchus in addition to right hilar mediastinal lymphadenopathy.  She also has a suspicious liver lesion.  The patient's molecular studies by [employer] showed no actionable mutations. ? ?The patient completed concurrent chemoradiation with carboplatin for an AUC of 2 and paclitaxel 45 mg per metered square.  She is status post 6 cycles.  The last dose of  treatment was on 03/02/2021. ? ?She is currently undergoing consolidation immunotherapy with Imfinzi 1500 mg IV every 4 weeks.  The patient is status post 5 cycles. ? ?  Labs were reviewed.  Recommend that

## 2021-09-07 ENCOUNTER — Inpatient Hospital Stay: Payer: Commercial Managed Care - HMO | Attending: Physician Assistant

## 2021-09-07 ENCOUNTER — Inpatient Hospital Stay: Payer: Commercial Managed Care - HMO

## 2021-09-07 ENCOUNTER — Inpatient Hospital Stay (HOSPITAL_BASED_OUTPATIENT_CLINIC_OR_DEPARTMENT_OTHER): Payer: Commercial Managed Care - HMO | Admitting: Physician Assistant

## 2021-09-07 ENCOUNTER — Other Ambulatory Visit: Payer: Self-pay

## 2021-09-07 VITALS — BP 114/73 | HR 92 | Temp 97.6°F | Resp 18 | Ht 67.0 in | Wt 204.6 lb

## 2021-09-07 DIAGNOSIS — Z79899 Other long term (current) drug therapy: Secondary | ICD-10-CM | POA: Insufficient documentation

## 2021-09-07 DIAGNOSIS — C3491 Malignant neoplasm of unspecified part of right bronchus or lung: Secondary | ICD-10-CM | POA: Diagnosis not present

## 2021-09-07 DIAGNOSIS — C3411 Malignant neoplasm of upper lobe, right bronchus or lung: Secondary | ICD-10-CM | POA: Insufficient documentation

## 2021-09-07 DIAGNOSIS — Z5112 Encounter for antineoplastic immunotherapy: Secondary | ICD-10-CM | POA: Insufficient documentation

## 2021-09-07 DIAGNOSIS — Z5111 Encounter for antineoplastic chemotherapy: Secondary | ICD-10-CM

## 2021-09-07 LAB — CMP (CANCER CENTER ONLY)
ALT: 40 U/L (ref 0–44)
AST: 41 U/L (ref 15–41)
Albumin: 4.3 g/dL (ref 3.5–5.0)
Alkaline Phosphatase: 78 U/L (ref 38–126)
Anion gap: 10 (ref 5–15)
BUN: 9 mg/dL (ref 6–20)
CO2: 23 mmol/L (ref 22–32)
Calcium: 9.8 mg/dL (ref 8.9–10.3)
Chloride: 105 mmol/L (ref 98–111)
Creatinine: 0.64 mg/dL (ref 0.44–1.00)
GFR, Estimated: >60 mL/min (ref >60)
Glucose, Bld: 147 mg/dL — ABNORMAL HIGH (ref 70–99)
Potassium: 4.2 mmol/L (ref 3.5–5.1)
Sodium: 138 mmol/L (ref 135–145)
Total Bilirubin: 0.5 mg/dL (ref 0.3–1.2)
Total Protein: 7.8 g/dL (ref 6.5–8.1)

## 2021-09-07 LAB — CBC WITH DIFFERENTIAL (CANCER CENTER ONLY)
Abs Immature Granulocytes: 0.07 10*3/uL (ref 0.00–0.07)
Basophils Absolute: 0 10*3/uL (ref 0.0–0.1)
Basophils Relative: 0 %
Eosinophils Absolute: 0.3 10*3/uL (ref 0.0–0.5)
Eosinophils Relative: 3 %
HCT: 39 % (ref 36.0–46.0)
Hemoglobin: 13 g/dL (ref 12.0–15.0)
Immature Granulocytes: 1 %
Lymphocytes Relative: 11 %
Lymphs Abs: 1 10*3/uL (ref 0.7–4.0)
MCH: 29.1 pg (ref 26.0–34.0)
MCHC: 33.3 g/dL (ref 30.0–36.0)
MCV: 87.2 fL (ref 80.0–100.0)
Monocytes Absolute: 0.6 10*3/uL (ref 0.1–1.0)
Monocytes Relative: 6 %
Neutro Abs: 7 10*3/uL (ref 1.7–7.7)
Neutrophils Relative %: 79 %
Platelet Count: 270 10*3/uL (ref 150–400)
RBC: 4.47 MIL/uL (ref 3.87–5.11)
RDW: 13.8 % (ref 11.5–15.5)
WBC Count: 8.9 10*3/uL (ref 4.0–10.5)
nRBC: 0 % (ref 0.0–0.2)

## 2021-09-07 LAB — TSH: TSH: 0.661 u[IU]/mL (ref 0.308–3.960)

## 2021-09-07 LAB — PREGNANCY, URINE: Preg Test, Ur: NEGATIVE

## 2021-09-07 MED ORDER — SODIUM CHLORIDE 0.9 % IV SOLN: Freq: Once | INTRAVENOUS | Status: AC

## 2021-09-07 MED ORDER — SODIUM CHLORIDE 0.9 % IV SOLN
1500.0000 mg | Freq: Once | INTRAVENOUS | Status: AC
  Administered 2021-09-07: 1500 mg via INTRAVENOUS
  Filled 2021-09-07: qty 30

## 2021-09-07 NOTE — Patient Instructions (Signed)
[facility] MEDICAL ONCOLOGY  Discharge Instructions: Thank you for choosing [facility] to provide your oncology and hematology care.   If you have a lab appointment with the Cancer Center, please go directly to the Cancer Center and check in at the registration area.   Wear comfortable clothing and clothing appropriate for easy access to any Portacath or PICC line.   We strive to give you quality time with your provider. You may need to reschedule your appointment if you arrive late (15 or more minutes).  Arriving late affects you and other patients whose appointments are after yours.  Also, if you miss three or more appointments without notifying the office, you may be dismissed from the clinic at the provider's discretion.      For prescription refill requests, have your pharmacy contact our office and allow 72 hours for refills to be completed.    Today you received the following chemotherapy and/or immunotherapy agents Imfinzi      To help prevent nausea and vomiting after your treatment, we encourage you to take your nausea medication as directed.  BELOW ARE SYMPTOMS THAT SHOULD BE REPORTED IMMEDIATELY: *FEVER GREATER THAN 100.4 F (38 C) OR HIGHER *CHILLS OR SWEATING *NAUSEA AND VOMITING THAT IS NOT CONTROLLED WITH YOUR NAUSEA MEDICATION *UNUSUAL SHORTNESS OF BREATH *UNUSUAL BRUISING OR BLEEDING *URINARY PROBLEMS (pain or burning when urinating, or frequent urination) *BOWEL PROBLEMS (unusual diarrhea, constipation, pain near the anus) TENDERNESS IN MOUTH AND THROAT WITH OR WITHOUT PRESENCE OF ULCERS (sore throat, sores in mouth, or a toothache) UNUSUAL RASH, SWELLING OR PAIN  UNUSUAL VAGINAL DISCHARGE OR ITCHING   Items with * indicate a potential emergency and should be followed up as soon as possible or go to the Emergency Department if any problems should occur.  Please show the CHEMOTHERAPY ALERT CARD or IMMUNOTHERAPY ALERT CARD at check-in to the  Emergency Department and triage nurse.  Should you have questions after your visit or need to cancel or reschedule your appointment, please contact [facility] MEDICAL [facility]COLOG[facility]  Dept: [phone]  and follow the prompts.  Office hours are 8:00 a.m. to 4:30 p.m. Monday - Friday. Please note that voicemails left after 4:00 p.m. may not be returned until the following business day.  We are closed weekends and major holidays. You have access to a nurse at all times for urgent questions. Please call the main number to the clinic Dept: [phone] and follow the prompts.   For any non-urgent questions, you may also contact your provider using MyChart. We now offer e-Visits for anyone 18 and older to request care online for non-urgent symptoms. For details visit [email or web address].   Also download the MyChart app! Go to the app store, search "MyChart", open the app, select [facility], and log in with your MyChart username and password.  Due to Covid, a mask is required upon entering the hospital/clinic. If you do not have a mask, one will be given to you upon arrival. For doctor visits, patients may have 1 support person aged 18 or older with them. For treatment visits, patients cannot have anyone with them due to current Covid guidelines and our immunocompromised population.   

## 2021-09-18 ENCOUNTER — Other Ambulatory Visit: Payer: Self-pay | Admitting: Physician Assistant

## 2021-09-18 DIAGNOSIS — C3491 Malignant neoplasm of unspecified part of right bronchus or lung: Secondary | ICD-10-CM

## 2021-09-18 NOTE — Progress Notes (Signed)
I received a message that her insurance approved her scan at [facility] as opposed to [facility]. I will call [facility] radiology scheduling and cancel the scan. I will call the patient and give her the number for [facility] so she can schedule her scan.  ?

## 2021-09-21 ENCOUNTER — Ambulatory Visit: Payer: Managed Care, Other (non HMO)

## 2021-09-21 ENCOUNTER — Ambulatory Visit: Payer: Managed Care, Other (non HMO) | Admitting: Physician Assistant

## 2021-09-21 ENCOUNTER — Other Ambulatory Visit: Payer: Managed Care, Other (non HMO)

## 2021-09-24 ENCOUNTER — Encounter: Payer: Self-pay | Admitting: Internal Medicine

## 2021-09-24 ENCOUNTER — Ambulatory Visit
Admission: RE | Admit: 2021-09-24 | Discharge: 2021-09-24 | Disposition: A | Discharge status: Home / Self Care | Payer: Managed Care, Other (non HMO) | Source: Ambulatory Visit | Attending: Physician Assistant | Admitting: Physician Assistant

## 2021-09-24 DIAGNOSIS — C3491 Malignant neoplasm of unspecified part of right bronchus or lung: Secondary | ICD-10-CM

## 2021-09-24 MED ORDER — IOPAMIDOL (ISOVUE-300) INJECTION 61%
75.0000 mL | Freq: Once | INTRAVENOUS | Status: AC | PRN
  Administered 2021-09-24: 75 mL via INTRAVENOUS

## 2021-09-29 ENCOUNTER — Ambulatory Visit (HOSPITAL_COMMUNITY): Payer: Managed Care, Other (non HMO)

## 2021-10-05 ENCOUNTER — Inpatient Hospital Stay: Payer: Managed Care, Other (non HMO)

## 2021-10-05 ENCOUNTER — Other Ambulatory Visit: Payer: Managed Care, Other (non HMO)

## 2021-10-05 ENCOUNTER — Ambulatory Visit: Payer: Self-pay | Admitting: Internal Medicine

## 2021-10-06 ENCOUNTER — Inpatient Hospital Stay: Payer: Managed Care, Other (non HMO)

## 2021-10-06 ENCOUNTER — Other Ambulatory Visit: Payer: Self-pay

## 2021-10-06 ENCOUNTER — Inpatient Hospital Stay: Payer: Managed Care, Other (non HMO) | Attending: Internal Medicine

## 2021-10-06 ENCOUNTER — Encounter: Payer: Self-pay | Admitting: Internal Medicine

## 2021-10-06 ENCOUNTER — Inpatient Hospital Stay (HOSPITAL_BASED_OUTPATIENT_CLINIC_OR_DEPARTMENT_OTHER): Payer: Commercial Managed Care - HMO | Admitting: Internal Medicine

## 2021-10-06 VITALS — HR 94

## 2021-10-06 VITALS — BP 124/78 | HR 106 | Temp 97.9°F | Resp 19 | Ht 67.0 in | Wt 207.7 lb

## 2021-10-06 DIAGNOSIS — C3411 Malignant neoplasm of upper lobe, right bronchus or lung: Secondary | ICD-10-CM | POA: Diagnosis not present

## 2021-10-06 DIAGNOSIS — Z5112 Encounter for antineoplastic immunotherapy: Secondary | ICD-10-CM

## 2021-10-06 DIAGNOSIS — C3491 Malignant neoplasm of unspecified part of right bronchus or lung: Secondary | ICD-10-CM

## 2021-10-06 DIAGNOSIS — Z79899 Other long term (current) drug therapy: Secondary | ICD-10-CM | POA: Diagnosis not present

## 2021-10-06 DIAGNOSIS — Z5111 Encounter for antineoplastic chemotherapy: Secondary | ICD-10-CM

## 2021-10-06 LAB — CBC WITH DIFFERENTIAL (CANCER CENTER ONLY)
Abs Immature Granulocytes: 0.04 10*3/uL (ref 0.00–0.07)
Basophils Absolute: 0 10*3/uL (ref 0.0–0.1)
Basophils Relative: 1 %
Eosinophils Absolute: 0.3 10*3/uL (ref 0.0–0.5)
Eosinophils Relative: 4 %
HCT: 41.3 % (ref 36.0–46.0)
Hemoglobin: 13.6 g/dL (ref 12.0–15.0)
Immature Granulocytes: 1 %
Lymphocytes Relative: 17 %
Lymphs Abs: 1.4 10*3/uL (ref 0.7–4.0)
MCH: 29.1 pg (ref 26.0–34.0)
MCHC: 32.9 g/dL (ref 30.0–36.0)
MCV: 88.4 fL (ref 80.0–100.0)
Monocytes Absolute: 0.6 10*3/uL (ref 0.1–1.0)
Monocytes Relative: 7 %
Neutro Abs: 5.9 10*3/uL (ref 1.7–7.7)
Neutrophils Relative %: 70 %
Platelet Count: 298 10*3/uL (ref 150–400)
RBC: 4.67 MIL/uL (ref 3.87–5.11)
RDW: 14.3 % (ref 11.5–15.5)
WBC Count: 8.3 10*3/uL (ref 4.0–10.5)
nRBC: 0 % (ref 0.0–0.2)

## 2021-10-06 LAB — CMP (CANCER CENTER ONLY)
ALT: 41 U/L (ref 0–44)
AST: 39 U/L (ref 15–41)
Albumin: 4.5 g/dL (ref 3.5–5.0)
Alkaline Phosphatase: 85 U/L (ref 38–126)
Anion gap: 13 (ref 5–15)
BUN: 10 mg/dL (ref 6–20)
CO2: 23 mmol/L (ref 22–32)
Calcium: 10.1 mg/dL (ref 8.9–10.3)
Chloride: 106 mmol/L (ref 98–111)
Creatinine: 0.65 mg/dL (ref 0.44–1.00)
GFR, Estimated: >60 mL/min (ref >60)
Glucose, Bld: 119 mg/dL — ABNORMAL HIGH (ref 70–99)
Potassium: 3.7 mmol/L (ref 3.5–5.1)
Sodium: 142 mmol/L (ref 135–145)
Total Bilirubin: 0.3 mg/dL (ref 0.3–1.2)
Total Protein: 8.2 g/dL — ABNORMAL HIGH (ref 6.5–8.1)

## 2021-10-06 LAB — PREGNANCY, URINE: Preg Test, Ur: NEGATIVE

## 2021-10-06 LAB — TSH: TSH: 1.282 u[IU]/mL (ref 0.350–4.500)

## 2021-10-06 MED ORDER — SODIUM CHLORIDE 0.9 % IV SOLN: Freq: Once | INTRAVENOUS | Status: AC

## 2021-10-06 MED ORDER — SODIUM CHLORIDE 0.9 % IV SOLN
1500.0000 mg | Freq: Once | INTRAVENOUS | Status: AC
  Administered 2021-10-06: 1500 mg via INTRAVENOUS
  Filled 2021-10-06: qty 30

## 2021-10-06 NOTE — Patient Instructions (Signed)
Steps to Quit Smoking Smoking tobacco is the leading cause of preventable death. It can affect almost every organ in the body. Smoking puts you and people around you at risk for many serious, long-lasting (chronic) diseases. Quitting smoking can be hard, but it is one of the best things that you can do for your health. It is never too late to quit. Do not give up if you cannot quit the first time. Some people need to try many times to quit. Do your best to stick to your quit plan, and talk with your doctor if you have any questions or concerns. How do I get ready to quit? Pick a date to quit. Set a date within the next 2 weeks to give you time to prepare. Write down the reasons why you are quitting. Keep this list in places where you will see it often. Tell your family, friends, and co-workers that you are quitting. Their support is important. Talk with your doctor about the choices that may help you quit. Find out if your health insurance will pay for these treatments. Know the people, places, things, and activities that make you want to smoke (triggers). Avoid them. What first steps can I take to quit smoking? Throw away all cigarettes at home, at work, and in your car. Throw away the things that you use when you smoke, such as ashtrays and lighters. Clean your car. Empty the ashtray. Clean your home, including curtains and carpets. What can I do to help me quit smoking? Talk with your doctor about taking medicines and seeing a counselor. You are more likely to succeed when you do both. If you are pregnant or breastfeeding: Talk with your doctor about counseling or other ways to quit smoking. Do not take medicine to help you quit smoking unless your doctor tells you to. Quit right away Quit smoking completely, instead of slowly cutting back on how much you smoke over a period of time. Stopping smoking right away may be more successful than slowly quitting. Go to counseling. In-person is best  if this is an option. You are more likely to quit if you go to counseling sessions regularly. Take medicine You may take medicines to help you quit. Some medicines need a prescription, and some you can buy over-the-counter. Some medicines may contain a drug called nicotine to replace the nicotine in cigarettes. Medicines may: Help you stop having the desire to smoke (cravings). Help to stop the problems that come when you stop smoking (withdrawal symptoms). Your doctor may ask you to use: Nicotine patches, gum, or lozenges. Nicotine inhalers or sprays. Non-nicotine medicine that you take by mouth. Find resources Find resources and other ways to help you quit smoking and remain smoke-free after you quit. They include: Online chats with a counselor. Phone quitlines. Printed self-help materials. Support groups or group counseling. Text messaging programs. Mobile phone apps. Use apps on your mobile phone or tablet that can help you stick to your quit plan. Examples of free services include Quit Guide from the CDC and smokefree.gov  What can I do to make it easier to quit?  Talk to your family and friends. Ask them to support and encourage you. Call a phone quitline, such as 1-800-QUIT-NOW, reach out to support groups, or work with a counselor. Ask people who smoke to not smoke around you. Avoid places that make you want to smoke, such as: Bars. Parties. Smoke-break areas at work. Spend time with people who do not smoke. Lower   the stress in your life. Stress can make you want to smoke. Try these things to lower stress: Getting regular exercise. Doing deep-breathing exercises. Doing yoga. Meditating. What benefits will I see if I quit smoking? Over time, you may have: A better sense of smell and taste. Less coughing and sore throat. A slower heart rate. Lower blood pressure. Clearer skin. Better breathing. Fewer sick days. Summary Quitting smoking can be hard, but it is one of  the best things that you can do for your health. Do not give up if you cannot quit the first time. Some people need to try many times to quit. When you decide to quit smoking, make a plan to help you succeed. Quit smoking right away, not slowly over a period of time. When you start quitting, get help and support to keep you smoke-free. This information is not intended to replace advice given to you by your health care provider. Make sure you discuss any questions you have with your health care provider. Document Revised: 05/22/2021 Document Reviewed: 05/22/2021 Elsevier Patient Education  2023 [employer].  

## 2021-10-06 NOTE — Patient Instructions (Signed)
[facility]  Discharge Instructions: Thank you for choosing [facility] to provide your oncology and hematology care.   If you have a lab appointment with the Cancer Center, please go directly to the Cancer Center and check in at the registration area.   Wear comfortable clothing and clothing appropriate for easy access to any Portacath or PICC line.   We strive to give you quality time with your provider. You may need to reschedule your appointment if you arrive late (15 or more minutes).  Arriving late affects you and other patients whose appointments are after yours.  Also, if you miss three or more appointments without notifying the office, you may be dismissed from the clinic at the provider's discretion.      For prescription refill requests, have your pharmacy contact our office and allow 72 hours for refills to be completed.    Today you received the following chemotherapy and/or immunotherapy agents Imfinzi      To help prevent nausea and vomiting after your treatment, we encourage you to take your nausea medication as directed.  BELOW ARE SYMPTOMS THAT SHOULD BE REPORTED IMMEDIATELY: *FEVER GREATER THAN 100.4 F (38 C) OR HIGHER *CHILLS OR SWEATING *NAUSEA AND VOMITING THAT IS NOT CONTROLLED WITH YOUR NAUSEA MEDICATION *UNUSUAL SHORTNESS OF BREATH *UNUSUAL BRUISING OR BLEEDING *URINARY PROBLEMS (pain or burning when urinating, or frequent urination) *BOWEL PROBLEMS (unusual diarrhea, constipation, pain near the anus) TENDERNESS IN MOUTH AND THROAT WITH OR WITHOUT PRESENCE OF ULCERS (sore throat, sores in mouth, or a toothache) UNUSUAL RASH, SWELLING OR PAIN  UNUSUAL VAGINAL DISCHARGE OR ITCHING   Items with * indicate a potential emergency and should be followed up as soon as possible or go to the Emergency Department if any problems should occur.  Please show the CHEMOTHERAPY ALERT CARD or IMMUNOTHERAPY ALERT CARD at check-in to the  Emergency Department and triage nurse.  Should you have questions after your visit or need to cancel or reschedule your appointment, please contact [facility]  Dept: [phone]  and follow the prompts.  Office hours are 8:00 a.m. to 4:30 p.m. Monday - Friday. Please note that voicemails left after 4:00 p.m. may not be returned until the following business day.  We are closed weekends and major holidays. You have access to a nurse at all times for urgent questions. Please call the main number to the clinic Dept: [phone] and follow the prompts.   For any non-urgent questions, you may also contact your provider using MyChart. We now offer e-Visits for anyone 18 and older to request care online for non-urgent symptoms. For details visit [email or web address].   Also download the MyChart app! Go to the app store, search "MyChart", open the app, select , and log in with your MyChart username and password.  Due to Covid, a mask is required upon entering the hospital/clinic. If you do not have a mask, one will be given to you upon arrival. For doctor visits, patients may have 1 support person aged 18 or older with them. For treatment visits, patients cannot have anyone with them due to current Covid guidelines and our immunocompromised population.   

## 2021-10-06 NOTE — Progress Notes (Signed)
?    [facility] ?Telephone:(336) [phone]   [phone] ? ?OFFICE PROGRESS NOTE ? ?Lujean Amel, MD ?[address] ?[place] [address] ? ?DIAGNOSIS: stage IIIC (T4, N3, M0) non-small cell lung cancer, adenocarcinoma presented with large right upper lobe lung mass with associated narrowing and occlusion of the right pulmonary artery superior trunk as well as narrowing of the interlobular right pulmonary artery and encasement and narrowing of the right upper lobe bronchus in addition to right hilar and subcarinal lymphadenopathy and suspicious lesion in the liver.  This was diagnosed in August 2022. ? ?Molecular studies by [employer] showed no actionable mutations. ? ?PRIOR THERAPY: Concurrent chemoradiation with weekly carboplatin for AUC of 2 and paclitaxel 45 Mg/M2.  Status post 6 cycles.  Last dose was given on 03/02/2021. ? ?CURRENT THERAPY: Consolidation treatment with immunotherapy with Imfinzi 1500 Mg IV every 4 weeks.  First dose April 06, 2021.  Status post 6 cycles. ? ?INTERVAL HISTORY: ?Abigail Clark 46 y.o. female returns to the clinic today for follow-up visit.  The patient is feeling fine today with no concerning complaints.  She denied having any chest pain, shortness of breath, cough or hemoptysis.  She denied having any fever or chills.  She has no nausea, vomiting, diarrhea or constipation.  She has no headache or visual changes.  She has no weight loss or night sweats.  The patient denied having any bleeding, bruises or ecchymosis.  She continues to tolerate her treatment with immunotherapy fairly well.  She had repeat CT scan of the chest performed recently and she is here for evaluation and discussion of her scan results. ? ? ?MEDICAL HISTORY: ?Past Medical History:  ?Diagnosis Date  ? Cataract   ? RIGHT EYE  ? Eczema   ? Exotropia of right eye 11/2014  ? History of radiation therapy   ? Right Lung- 01/26/21-03/09/21- Dr. Gery Pray  ?  Irritable bowel syndrome (IBS)   ? no current med.  ? Lactose intolerance   ? ? ?ALLERGIES:  is allergic to penicillins. ? ?MEDICATIONS:  ?Current Outpatient Medications  ?Medication Sig Dispense Refill  ? cholecalciferol (VITAMIN D3) 25 MCG (1000 UNIT) tablet Take 1,000 Units by mouth daily.    ? Multiple Vitamin (MULTIVITAMIN) tablet Take 1 tablet by mouth daily.    ? omeprazole (PRILOSEC) 20 MG capsule Take 20 mg by mouth 2 (two) times daily as needed (indigestion).    ? prochlorperazine (COMPAZINE) 10 MG tablet Take 1 tablet (10 mg total) by mouth every 6 (six) hours as needed for nausea or vomiting. (Patient not taking: Reported on 04/02/2021) 30 tablet 0  ? vitamin E 1000 UNIT capsule Take 1,000 Units by mouth daily.    ? ?No current facility-administered medications for this visit.  ? ? ?SURGICAL HISTORY:  ?Past Surgical History:  ?Procedure Laterality Date  ? BRONCHIAL BIOPSY  01/12/2021  ? Procedure: BRONCHIAL BIOPSIES;  Surgeon: Spero Geralds, MD;  Location[facility] END[facility]COPY;  Service: Pulmonary;;  ? BRONCHIAL NEEDLE ASPIRATION BIOPSY  01/12/2021  ? Procedure: BRONCHIAL NEEDLE ASPIRATION BIOPSIES;  Surgeon: Spero Geralds, MD;  Location: [facility] ENDOSCOPY;  Service: Pulmonary;;  ? CATARACT PEDIATRIC Right 1986  ? LEEP  05/20/2006  ? LIPOSUCTION  10/2014  ? abd. - local anes.  ? STRABISMUS SURGERY Right 11/29/2014  ? Procedure: REPAIR STRABISMUS RIGHT EYE ;  Surgeon: Everitt Amber, MD;  Location: [facility];  Service: Ophthalmology;  Laterality: Right;  ?  VIDEO BRONCHOSCOPY WITH ENDOBRONCHIAL ULTRASOUND N/A 01/12/2021  ? Procedure: VIDEO BRONCHOSCOPY WITH ENDOBRONCHIAL ULTRASOUND;  Surgeon: Spero Geralds, MD;  Location: [facility] ENDOSCOPY;  Service: Pulmonary;  Laterality: N/A;  ? WISDOM TOOTH EXTRACTION    ? ? ?REVIEW OF SYSTEMS:  Constitutional: negative ?Eyes: negative ?Ears, nose, mouth, throat, and face: negative ?Respiratory: negative ?Cardiovascular: negative ?Gastrointestinal:  negative ?Genitourinary:negative ?Integument/breast: negative ?Hematologic/lymphatic: negative ?Musculoskeletal:negative ?Neurological: negative ?Behavioral/Psych: negative ?Endocrine: negative ?Allergic/Immunologic: negative  ? ?PHYSICAL EXAMINATION: General appearance: alert, cooperative, and no distress ?Head: Normocephalic, without obvious abnormality, atraumatic ?Neck: no adenopathy, no JVD, supple, symmetrical, trachea midline, and thyroid not enlarged, symmetric, no tenderness/mass/nodules ?Lymph nodes: Cervical, supraclavicular, and axillary nodes normal. ?Resp: clear to auscultation bilaterally ?Back: symmetric, no curvature. ROM normal. No CVA tenderness. ?Cardio: regular rate and rhythm, S1, S2 normal, no murmur, click, rub or gallop ?GI: soft, non-tender; bowel sounds normal; no masses,  no organomegaly ?Extremities: extremities normal, atraumatic, no cyanosis or edema ?Neurologic: Alert and oriented X 3, normal strength and tone. Normal symmetric reflexes. Normal coordination and gait ? ?ECOG PERFORMANCE STATUS: 0 - Asymptomatic ? ?Blood pressure 124/78, pulse (!) 106, temperature 97.9 ?F (36.6 ?C), temperature source Tympanic, resp. rate 19, height 5\' 7"  (1.702 m), weight 207 lb 11.2 oz (94.2 kg), SpO2 100 %. ? ?LABORATORY DATA: ?Lab Results  ?Component Value Date  ? WBC 8.9 09/07/2021  ? HGB 13.0 09/07/2021  ? HCT 39.0 09/07/2021  ? MCV 87.2 09/07/2021  ? PLT 270 09/07/2021  ? ? ?  Chemistry   ?   ?Component Value Date/Time  ? NA 138 09/07/2021 1008  ? K 4.2 09/07/2021 1008  ? CL 105 09/07/2021 1008  ? CO2 23 09/07/2021 1008  ? BUN 9 09/07/2021 1008  ? CREATININE 0.64 09/07/2021 1008  ?    ?Component Value Date/Time  ? CALCIUM 9.8 09/07/2021 1008  ? ALKPHOS 78 09/07/2021 1008  ? AST 41 09/07/2021 1008  ? ALT 40 09/07/2021 1008  ? BILITOT 0.5 09/07/2021 1008  ?  ? ? ? ?RADIOGRAPHIC STUDIES: ?CT Chest W Contrast ? ?Result Date: 09/24/2021 ?CLINICAL DATA:  Primary Cancer Type: Lung Imaging Indication:  Assess response to therapy Interval therapy since last imaging? Yes Initial Cancer Diagnosis Date: 01/12/2021; Established by: Biopsy-proven Detailed Pathology: Stage IIIC non-small cell lung cancer, adenocarcinoma. Primary Tumor location: Right upper lobe. Surgeries: No thoracic. Chemotherapy: Yes; Ongoing? No; Most recent administration: 03/02/2021 Immunotherapy?  Yes; Type: Imfinzi; Ongoing? Yes Radiation therapy? Yes; Date Range: 01/26/2021 - 03/09/2021; Target: Right lung EXAM: CT CHEST WITH CONTRAST TECHNIQUE: Multidetector CT imaging of the chest was performed during intravenous contrast administration. RADIATION DOSE REDUCTION: This exam was performed according to the departmental dose-optimization program which includes automated exposure control, adjustment of the mA and/or kV according to patient size and/or use of iterative reconstruction technique. CONTRAST:  13mL ISOVUE-300 IOPAMIDOL (ISOVUE-300) INJECTION 61% COMPARISON:  Most recent CT chest 06/25/2021.  02/02/2021 PET-CT. FINDINGS: Cardiovascular: The heart is normal in size. No pericardial effusion. The aorta is in caliber. No dissection. Aberrant right subclavian artery and noted. Mediastinum/Nodes: Stable small bilateral thyroid nodules, negative on recent PET-CT. Stable mediastinal and hilar lymph nodes. The right hilar node measures 20 mm and previously measured 24 mm. The precarinal node measures 9 mm and previously measured 10 mm. The esophagus is grossly. Lungs/Pleura: Large cavitary mass in the right upper lobe measures approximately 6.1 x 4.6 cm and previously measured 7.1 x 5.2 cm. Surrounding thick interstitial changes could be related to radiation  or pneumonitis. Stable severe underlying emphysematous changes and pulmonary scarring. The solid-appearing left upper lobe pulmonary nodule seen on the prior study appears less solid and smaller on today's study measuring a maximum of 5 mm. It previously measured 7.5 mm. This could be  inflammatory or post treatment changes. Attention on future studies is suggested. No new pulmonary lesions or pulmonary nodules. Upper Abdomen: Stable small hepatic cysts. There is a 4 cm hyperenhancing lesions in the left hepatic lobe. This is

## 2021-10-07 ENCOUNTER — Encounter: Payer: Self-pay | Admitting: Internal Medicine

## 2021-10-20 ENCOUNTER — Telehealth: Payer: Self-pay

## 2021-10-20 NOTE — Telephone Encounter (Signed)
Pt LVM stating her insurance will not take effect until 11/12/21 but has an appt 11/02/21 and really needs to come. She states she is not sure what to do.  ? ?I have checked with the financial counselor, Marguarite Arbour H., who advised the pt would be self pay but would receive a 60% discount. ? ? I have attempted to call the pt regarding this. There was no naswer and I was not able to LVM because her mailbox is full. ?

## 2021-10-23 ENCOUNTER — Telehealth: Payer: Self-pay | Admitting: Medical Oncology

## 2021-10-23 ENCOUNTER — Telehealth: Payer: Self-pay | Admitting: Internal Medicine

## 2021-10-23 NOTE — Telephone Encounter (Signed)
Pt LVM that her insurance will not cover her next appts on 05/22. ? I was unable to make phone contact with pt . Her VM is full. ?

## 2021-10-23 NOTE — Telephone Encounter (Signed)
.  Called patient to schedule appointment per 5/12 inbasket, patient is aware of date and time.   ?

## 2021-10-23 NOTE — Telephone Encounter (Signed)
Per Dr. Julien Nordmann , it is okay to move appts to June 1st.Pt aware, Schedule message sent. ? ?

## 2021-10-27 ENCOUNTER — Encounter: Payer: Self-pay | Admitting: Internal Medicine

## 2021-11-02 ENCOUNTER — Other Ambulatory Visit: Payer: Managed Care, Other (non HMO)

## 2021-11-02 ENCOUNTER — Ambulatory Visit: Payer: Managed Care, Other (non HMO) | Admitting: Internal Medicine

## 2021-11-02 ENCOUNTER — Inpatient Hospital Stay: Payer: Commercial Managed Care - HMO

## 2021-11-03 ENCOUNTER — Encounter: Payer: Self-pay | Admitting: Internal Medicine

## 2021-11-10 ENCOUNTER — Encounter: Payer: Self-pay | Admitting: Internal Medicine

## 2021-11-11 ENCOUNTER — Encounter: Payer: Self-pay | Admitting: Internal Medicine

## 2021-11-12 ENCOUNTER — Inpatient Hospital Stay: Payer: Managed Care, Other (non HMO) | Attending: Internal Medicine

## 2021-11-12 ENCOUNTER — Inpatient Hospital Stay: Payer: Managed Care, Other (non HMO)

## 2021-11-12 ENCOUNTER — Inpatient Hospital Stay (HOSPITAL_BASED_OUTPATIENT_CLINIC_OR_DEPARTMENT_OTHER): Payer: Managed Care, Other (non HMO) | Admitting: Internal Medicine

## 2021-11-12 ENCOUNTER — Other Ambulatory Visit: Payer: Self-pay

## 2021-11-12 ENCOUNTER — Encounter: Payer: Self-pay | Admitting: Internal Medicine

## 2021-11-12 VITALS — BP 111/74 | HR 97 | Temp 97.1°F | Resp 16 | Wt 213.2 lb

## 2021-11-12 DIAGNOSIS — Z5112 Encounter for antineoplastic immunotherapy: Secondary | ICD-10-CM | POA: Insufficient documentation

## 2021-11-12 DIAGNOSIS — C3411 Malignant neoplasm of upper lobe, right bronchus or lung: Secondary | ICD-10-CM | POA: Diagnosis present

## 2021-11-12 DIAGNOSIS — C3491 Malignant neoplasm of unspecified part of right bronchus or lung: Secondary | ICD-10-CM

## 2021-11-12 DIAGNOSIS — Z79899 Other long term (current) drug therapy: Secondary | ICD-10-CM | POA: Diagnosis not present

## 2021-11-12 LAB — CMP (CANCER CENTER ONLY)
ALT: 31 U/L (ref 0–44)
AST: 27 U/L (ref 15–41)
Albumin: 4.4 g/dL (ref 3.5–5.0)
Alkaline Phosphatase: 91 U/L (ref 38–126)
Anion gap: 8 (ref 5–15)
BUN: 10 mg/dL (ref 6–20)
CO2: 24 mmol/L (ref 22–32)
Calcium: 10.1 mg/dL (ref 8.9–10.3)
Chloride: 106 mmol/L (ref 98–111)
Creatinine: 0.57 mg/dL (ref 0.44–1.00)
GFR, Estimated: >60 mL/min (ref >60)
Glucose, Bld: 117 mg/dL — ABNORMAL HIGH (ref 70–99)
Potassium: 4.1 mmol/L (ref 3.5–5.1)
Sodium: 138 mmol/L (ref 135–145)
Total Bilirubin: 0.4 mg/dL (ref 0.3–1.2)
Total Protein: 7.7 g/dL (ref 6.5–8.1)

## 2021-11-12 LAB — CBC WITH DIFFERENTIAL (CANCER CENTER ONLY)
Abs Immature Granulocytes: 0.01 10*3/uL (ref 0.00–0.07)
Basophils Absolute: 0.1 10*3/uL (ref 0.0–0.1)
Basophils Relative: 1 %
Eosinophils Absolute: 0.3 10*3/uL (ref 0.0–0.5)
Eosinophils Relative: 3 %
HCT: 39.5 % (ref 36.0–46.0)
Hemoglobin: 13.5 g/dL (ref 12.0–15.0)
Immature Granulocytes: 0 %
Lymphocytes Relative: 15 %
Lymphs Abs: 1.3 10*3/uL (ref 0.7–4.0)
MCH: 29.7 pg (ref 26.0–34.0)
MCHC: 34.2 g/dL (ref 30.0–36.0)
MCV: 87 fL (ref 80.0–100.0)
Monocytes Absolute: 0.6 10*3/uL (ref 0.1–1.0)
Monocytes Relative: 7 %
Neutro Abs: 6.3 10*3/uL (ref 1.7–7.7)
Neutrophils Relative %: 74 %
Platelet Count: 272 10*3/uL (ref 150–400)
RBC: 4.54 MIL/uL (ref 3.87–5.11)
RDW: 14.1 % (ref 11.5–15.5)
WBC Count: 8.6 10*3/uL (ref 4.0–10.5)
nRBC: 0 % (ref 0.0–0.2)

## 2021-11-12 LAB — TSH: TSH: 1.335 u[IU]/mL (ref 0.350–4.500)

## 2021-11-12 MED ORDER — SODIUM CHLORIDE 0.9 % IV SOLN: Freq: Once | INTRAVENOUS | Status: AC

## 2021-11-12 MED ORDER — SODIUM CHLORIDE 0.9 % IV SOLN
1500.0000 mg | Freq: Once | INTRAVENOUS | Status: AC
  Administered 2021-11-12: 1500 mg via INTRAVENOUS
  Filled 2021-11-12: qty 30

## 2021-11-12 NOTE — Addendum Note (Signed)
Addended by: Ardeen Garland on: 11/12/2021 11:30 AM   Modules accepted: Orders

## 2021-11-12 NOTE — Progress Notes (Signed)
[facility] Telephone:(336) [phone]   Fax:(336) [phone]  OFFICE PROGRESS NOTE  Koirala, Dibas, MD [address] [place] [address]  DIAGNOSIS: stage IIIC (T4, N3, M0) non-small cell lung cancer, adenocarcinoma presented with large right upper lobe lung mass with associated narrowing and occlusion of the right pulmonary artery superior trunk as well as narrowing of the interlobular right pulmonary artery and encasement and narrowing of the right upper lobe bronchus in addition to right hilar and subcarinal lymphadenopathy and suspicious lesion in the liver.  This was diagnosed in August 2022.  Molecular studies by [employer] 360 showed no actionable mutations.  PRIOR THERAPY: Concurrent chemoradiation with weekly carboplatin for AUC of 2 and paclitaxel 45 Mg/M2.  Status post 6 cycles.  Last dose was given on 03/02/2021.  CURRENT THERAPY: Consolidation treatment with immunotherapy with Imfinzi 1500 Mg IV every 4 weeks.  First dose April 06, 2021.  Status post 7 cycles.  INTERVAL HISTORY: Abigail Clark 46 y.o. female returns to the clinic today for follow-up visit.  The patient is feeling fine today with no concerning complaints.  She denied having any chest pain, shortness of breath, cough or hemoptysis.  She denied having any fever or chills.  She has no nausea, vomiting, diarrhea or constipation.  She has no headache or visual changes.  She continues to tolerate her treatment with Imfinzi fairly well.  The patient is here today for evaluation before starting cycle #8.  MEDICAL HISTORY: Past Medical History:  Diagnosis Date   Cataract    RIGHT EYE   Eczema    Exotropia of right eye 11/2014   History of radiation therapy    Right Lung- 01/26/21-03/09/21- Dr. Gery Pray   Irritable bowel syndrome (IBS)    no current med.   Lactose intolerance     ALLERGIES:  is allergic to penicillins.  MEDICATIONS:  Current Outpatient Medications   Medication Sig Dispense Refill   cholecalciferol (VITAMIN D3) 25 MCG (1000 UNIT) tablet Take 1,000 Units by mouth daily.     Multiple Vitamin (MULTIVITAMIN) tablet Take 1 tablet by mouth daily.     omeprazole (PRILOSEC) 20 MG capsule Take 20 mg by mouth 2 (two) times daily as needed (indigestion).     OVER THE COUNTER MEDICATION Take 1 capsule by mouth daily.     prochlorperazine (COMPAZINE) 10 MG tablet Take 1 tablet (10 mg total) by mouth every 6 (six) hours as needed for nausea or vomiting. (Patient not taking: Reported on 04/02/2021) 30 tablet 0   Turmeric (QC TUMERIC COMPLEX) 500 MG CAPS Take 1 capsule by mouth daily.     vitamin E 1000 UNIT capsule Take 1,000 Units by mouth daily.     No current facility-administered medications for this visit.    SURGICAL HISTORY:  Past Surgical History:  Procedure Laterality Date   BRONCHIAL BIOPSY  01/12/2021   Procedure: BRONCHIAL BIOPSIES;  Surgeon: Spero Geralds, MD;  Location: [facility] ENDOSCOPY;  Service: Pulmonary;;   BRONCHIAL NEEDLE ASPIRATION BIOPSY  01/12/2021   Procedure: BRONCHIAL NEEDLE ASPIRATION BIOPSIES;  Surgeon: Spero Geralds, MD;  Location: [facility] ENDOSCOPY;  Service: Pulmonary;;   CATARACT PEDIATRIC Right 1986   LEEP  05/20/2006   LIPOSUCTION  10/2014   abd. - local anes.   STRABISMUS SURGERY Right 11/29/2014   Procedure: REPAIR STRABISMUS RIGHT EYE ;  Surgeon: Everitt Amber, MD;  Location: [facility];  Service: Ophthalmology;  Laterality: Right;  VIDEO BRONCHOSCOPY WITH ENDOBRONCHIAL ULTRASOUND N/A 01/12/2021   Procedure: VIDEO BRONCHOSCOPY WITH ENDOBRONCHIAL ULTRASOUND;  Surgeon: Spero Geralds, MD;  Location: [facility] ENDOSCOPY;  Service: Pulmonary;  Laterality: N/A;   WISDOM TOOTH EXTRACTION      REVIEW OF SYSTEMS:  A comprehensive review of systems was negative.   PHYSICAL EXAMINATION: General appearance: alert, cooperative, and no distress Head: Normocephalic, without obvious abnormality, atraumatic Neck: no  adenopathy, no JVD, supple, symmetrical, trachea midline, and thyroid not enlarged, symmetric, no tenderness/mass/nodules Lymph nodes: Cervical, supraclavicular, and axillary nodes normal. Resp: clear to auscultation bilaterally Back: symmetric, no curvature. ROM normal. No CVA tenderness. Cardio: regular rate and rhythm, S1, S2 normal, no murmur, click, rub or gallop GI: soft, non-tender; bowel sounds normal; no masses,  no organomegaly Extremities: extremities normal, atraumatic, no cyanosis or edema  ECOG PERFORMANCE STATUS: 0 - Asymptomatic  Blood pressure 111/74, pulse 97, temperature (!) 97.1 F (36.2 C), temperature source Tympanic, resp. rate 16, weight 213 lb 4 oz (96.7 kg), SpO2 100 %.  LABORATORY DATA: Lab Results  Component Value Date   WBC 8.6 11/12/2021   HGB 13.5 11/12/2021   HCT 39.5 11/12/2021   MCV 87.0 11/12/2021   PLT 272 11/12/2021      Chemistry      Component Value Date/Time   NA 142 10/06/2021 0819   K 3.7 10/06/2021 0819   CL 106 10/06/2021 0819   CO2 23 10/06/2021 0819   BUN 10 10/06/2021 0819   CREATININE 0.65 10/06/2021 0819      Component Value Date/Time   CALCIUM 10.1 10/06/2021 0819   ALKPHOS 85 10/06/2021 0819   AST 39 10/06/2021 0819   ALT 41 10/06/2021 0819   BILITOT 0.3 10/06/2021 0819       RADIOGRAPHIC STUDIES: No results found.  ASSESSMENT AND PLAN: This is a very pleasant 46 years old African-American female diagnosed with a stage IIIC (T4, N3, M0) non-small cell lung cancer, adenocarcinoma presented with large right upper lobe lung mass with narrowing and occlusion of the right pulmonary artery superior trunk as well as narrowing of the interlobular right pulmonary artery and encasement and narrowing of the right upper lobe bronchus in addition to right hilar and mediastinal lymphadenopathy and suspicious liver lesion. The patient had molecular studies by [employer] 360 that showed no actionable mutation. She had a PET scan  performed last week and that showed no evidence for metastatic disease outside the chest but there was evidence of metastatic disease to the left supraclavicular area. The patient underwent a course of concurrent chemoradiation with weekly carboplatin for AUC of 2 and paclitaxel 45 Mg/M2.  Status post 6 cycles.  Last dose was given on 03/02/2021.  Follow-up CT scan of the chest after the induction treatment showed significant decrease in the size of the right lung mass.  The patient is currently undergoing consolidation treatment with immunotherapy with Imfinzi 1500 Mg IV every 4 weeks status post 7 cycles. She has been tolerating her treatment well with no concerning adverse effects. I recommended for the patient to proceed with cycle #8 today as planned. I will see her back for follow-up visit in 4 weeks for evaluation before starting cycle #9.  The patient was advised to call immediately if she has any other concerning symptoms in the interval. The patient voices understanding of current disease status and treatment options and is in agreement with the current care plan.  All questions were answered. The patient knows to call the clinic with  any problems, questions or concerns. We can certainly see the patient much sooner if necessary.   Disclaimer: This note was dictated with voice recognition software. Similar sounding words can inadvertently be transcribed and may not be corrected upon review.

## 2021-11-12 NOTE — Patient Instructions (Signed)
[facility]   Discharge Instructions: Thank you for choosing [facility] to provide your oncology and hematology care.   If you have a lab appointment with the Cancer Center, please go directly to the Cancer Center and check in at the registration area.   Wear comfortable clothing and clothing appropriate for easy access to any Portacath or PICC line.   We strive to give you quality time with your provider. You may need to reschedule your appointment if you arrive late (15 or more minutes).  Arriving late affects you and other patients whose appointments are after yours.  Also, if you miss three or more appointments without notifying the office, you may be dismissed from the clinic at the provider's discretion.      For prescription refill requests, have your pharmacy contact our office and allow 72 hours for refills to be completed.    Today you received the following chemotherapy and/or immunotherapy agents: durvalumab.      To help prevent nausea and vomiting after your treatment, we encourage you to take your nausea medication as directed.  BELOW ARE SYMPTOMS THAT SHOULD BE REPORTED IMMEDIATELY: *FEVER GREATER THAN 100.4 F (38 C) OR HIGHER *CHILLS OR SWEATING *NAUSEA AND VOMITING THAT IS NOT CONTROLLED WITH YOUR NAUSEA MEDICATION *UNUSUAL SHORTNESS OF BREATH *UNUSUAL BRUISING OR BLEEDING *URINARY PROBLEMS (pain or burning when urinating, or frequent urination) *BOWEL PROBLEMS (unusual diarrhea, constipation, pain near the anus) TENDERNESS IN MOUTH AND THROAT WITH OR WITHOUT PRESENCE OF ULCERS (sore throat, sores in mouth, or a toothache) UNUSUAL RASH, SWELLING OR PAIN  UNUSUAL VAGINAL DISCHARGE OR ITCHING   Items with * indicate a potential emergency and should be followed up as soon as possible or go to the Emergency Department if any problems should occur.  Please show the CHEMOTHERAPY ALERT CARD or IMMUNOTHERAPY ALERT CARD at check-in  to the Emergency Department and triage nurse.  Should you have questions after your visit or need to cancel or reschedule your appointment, please contact [facility]  Dept: [phone]  and follow the prompts.  Office hours are 8:00 a.m. to 4:30 p.m. Monday - Friday. Please note that voicemails left after 4:00 p.m. may not be returned until the following business day.  We are closed weekends and major holidays. You have access to a nurse at all times for urgent questions. Please call the main number to the clinic Dept: [phone] and follow the prompts.   For any non-urgent questions, you may also contact your provider using MyChart. We now offer e-Visits for anyone 18 and older to request care online for non-urgent symptoms. For details visit [email or web address].   Also download the MyChart app! Go to the app store, search "MyChart", open the app, select , and log in with your MyChart username and password.  Due to Covid, a mask is required upon entering the hospital/clinic. If you do not have a mask, one will be given to you upon arrival. For doctor visits, patients may have 1 support person aged 18 or older with them. For treatment visits, patients cannot have anyone with them due to current Covid guidelines and our immunocompromised population.   

## 2021-11-30 ENCOUNTER — Ambulatory Visit: Payer: Managed Care, Other (non HMO) | Admitting: Physician Assistant

## 2021-11-30 ENCOUNTER — Ambulatory Visit: Payer: Managed Care, Other (non HMO)

## 2021-11-30 ENCOUNTER — Other Ambulatory Visit: Payer: Managed Care, Other (non HMO)

## 2021-12-07 ENCOUNTER — Encounter: Payer: Self-pay | Admitting: *Deleted

## 2021-12-07 NOTE — Progress Notes (Signed)
Oncology Nurse Navigator Documentation     12/07/2021    9:00 AM 03/09/2021   11:00 AM 01/30/2021    2:00 PM  Oncology Nurse Navigator Flowsheets  Abnormal Finding Date   01/11/2021  Confirmed Diagnosis Date   01/12/2021  Diagnosis Status   Confirmed Diagnosis Complete  Planned Course of Treatment   Chemo/Radiation Concurrent  Phase of Treatment Targeted Therapy Chemo Radiation  Chemotherapy Actual Start Date:  01/28/2021 01/28/2021  Chemotherapy Actual End Date:  03/02/2021   Radiation Actual Start Date:  01/26/2021 01/26/2021  Radiation Actual End Date:  03/09/2021   Targeted Therapy Actual Start Date: 04/06/2021    Navigator Follow Up Date:  06/08/2021 02/02/2021  Navigator Follow Up Reason:  Follow-up Appointment Education  Navigation Complete Date: 12/07/2021    Post Navigation: Continue to Follow Patient? No    Reason Not Navigating Patient: Patient On Maintenance Chemotherapy    Navigator [facility]  Navigator Encounter Type Appt/Treatment Plan Review Clinic/[facility];Follow-up Appt Other:  Treatment Initiated Date   01/26/2021  Patient Visit Type Other MedOnc   Treatment Phase Treatment Final Radiation Tx Treatment  Barriers/Navigation Needs Coordination of Care Coordination of Care;Education Coordination of Care  Education  Other   Interventions Coordination of Care Coordination of Care;Education;Psycho-Social Support Coordination of Care  Acuity Level 2-Minimal Needs (1-2 Barriers Identified) Level 2-Minimal Needs (1-2 Barriers Identified) Level 2-Minimal Needs (1-2 Barriers Identified)  Coordination of Care Other Other Other  Education Method  Verbal   Time Spent with Patient 15 45 30

## 2021-12-10 ENCOUNTER — Encounter: Payer: Self-pay | Admitting: Internal Medicine

## 2021-12-28 ENCOUNTER — Ambulatory Visit: Payer: Managed Care, Other (non HMO) | Admitting: Internal Medicine

## 2021-12-28 ENCOUNTER — Other Ambulatory Visit: Payer: Managed Care, Other (non HMO)

## 2021-12-28 ENCOUNTER — Ambulatory Visit: Payer: Managed Care, Other (non HMO)

## 2022-01-04 ENCOUNTER — Other Ambulatory Visit: Payer: Self-pay

## 2022-03-23 ENCOUNTER — Encounter: Payer: Self-pay | Admitting: Internal Medicine

## 2022-03-23 ENCOUNTER — Other Ambulatory Visit: Payer: Self-pay

## 2022-03-24 ENCOUNTER — Other Ambulatory Visit: Payer: Self-pay

## 2022-03-30 ENCOUNTER — Other Ambulatory Visit: Payer: Self-pay

## 2022-03-30 ENCOUNTER — Inpatient Hospital Stay: Payer: Commercial Managed Care - HMO

## 2022-03-30 ENCOUNTER — Encounter: Payer: Self-pay | Admitting: Internal Medicine

## 2022-03-30 ENCOUNTER — Inpatient Hospital Stay (HOSPITAL_BASED_OUTPATIENT_CLINIC_OR_DEPARTMENT_OTHER): Payer: Commercial Managed Care - HMO | Admitting: Internal Medicine

## 2022-03-30 ENCOUNTER — Inpatient Hospital Stay: Payer: Commercial Managed Care - HMO | Attending: Internal Medicine

## 2022-03-30 VITALS — BP 119/83 | HR 102 | Temp 97.9°F | Resp 16 | Wt 230.9 lb

## 2022-03-30 DIAGNOSIS — C3490 Malignant neoplasm of unspecified part of unspecified bronchus or lung: Secondary | ICD-10-CM

## 2022-03-30 DIAGNOSIS — C3491 Malignant neoplasm of unspecified part of right bronchus or lung: Secondary | ICD-10-CM

## 2022-03-30 DIAGNOSIS — K769 Liver disease, unspecified: Secondary | ICD-10-CM | POA: Diagnosis not present

## 2022-03-30 DIAGNOSIS — E041 Nontoxic single thyroid nodule: Secondary | ICD-10-CM | POA: Diagnosis not present

## 2022-03-30 DIAGNOSIS — C3411 Malignant neoplasm of upper lobe, right bronchus or lung: Secondary | ICD-10-CM | POA: Insufficient documentation

## 2022-03-30 DIAGNOSIS — R590 Localized enlarged lymph nodes: Secondary | ICD-10-CM | POA: Diagnosis not present

## 2022-03-30 DIAGNOSIS — Z923 Personal history of irradiation: Secondary | ICD-10-CM | POA: Diagnosis not present

## 2022-03-30 DIAGNOSIS — J432 Centrilobular emphysema: Secondary | ICD-10-CM | POA: Insufficient documentation

## 2022-03-30 DIAGNOSIS — Z79899 Other long term (current) drug therapy: Secondary | ICD-10-CM | POA: Diagnosis not present

## 2022-03-30 LAB — CBC WITH DIFFERENTIAL (CANCER CENTER ONLY)
Abs Immature Granulocytes: 0.03 10*3/uL (ref 0.00–0.07)
Basophils Absolute: 0.1 10*3/uL (ref 0.0–0.1)
Basophils Relative: 1 %
Eosinophils Absolute: 0.4 10*3/uL (ref 0.0–0.5)
Eosinophils Relative: 4 %
HCT: 39.8 % (ref 36.0–46.0)
Hemoglobin: 13.7 g/dL (ref 12.0–15.0)
Immature Granulocytes: 0 %
Lymphocytes Relative: 18 %
Lymphs Abs: 1.6 10*3/uL (ref 0.7–4.0)
MCH: 30.3 pg (ref 26.0–34.0)
MCHC: 34.4 g/dL (ref 30.0–36.0)
MCV: 88.1 fL (ref 80.0–100.0)
Monocytes Absolute: 0.6 10*3/uL (ref 0.1–1.0)
Monocytes Relative: 7 %
Neutro Abs: 6.3 10*3/uL (ref 1.7–7.7)
Neutrophils Relative %: 70 %
Platelet Count: 269 10*3/uL (ref 150–400)
RBC: 4.52 MIL/uL (ref 3.87–5.11)
RDW: 13.1 % (ref 11.5–15.5)
WBC Count: 8.9 10*3/uL (ref 4.0–10.5)
nRBC: 0 % (ref 0.0–0.2)

## 2022-03-30 LAB — CMP (CANCER CENTER ONLY)
ALT: 19 U/L (ref 0–44)
AST: 22 U/L (ref 15–41)
Albumin: 4.5 g/dL (ref 3.5–5.0)
Alkaline Phosphatase: 96 U/L (ref 38–126)
Anion gap: 9 (ref 5–15)
BUN: 10 mg/dL (ref 6–20)
CO2: 23 mmol/L (ref 22–32)
Calcium: 9.8 mg/dL (ref 8.9–10.3)
Chloride: 108 mmol/L (ref 98–111)
Creatinine: 0.71 mg/dL (ref 0.44–1.00)
GFR, Estimated: >60 mL/min (ref >60)
Glucose, Bld: 128 mg/dL — ABNORMAL HIGH (ref 70–99)
Potassium: 3.8 mmol/L (ref 3.5–5.1)
Sodium: 140 mmol/L (ref 135–145)
Total Bilirubin: 0.3 mg/dL (ref 0.3–1.2)
Total Protein: 7.9 g/dL (ref 6.5–8.1)

## 2022-03-30 LAB — TSH: TSH: 1.182 u[IU]/mL (ref 0.350–4.500)

## 2022-03-30 NOTE — Patient Instructions (Signed)
Steps to Quit Smoking Smoking tobacco is the leading cause of preventable death. It can affect almost every organ in the body. Smoking puts you and people around you at risk for many serious, long-lasting (chronic) diseases. Quitting smoking can be hard, but it is one of the best things that you can do for your health. It is never too late to quit. Do not give up if you cannot quit the first time. Some people need to try many times to quit. Do your best to stick to your quit plan, and talk with your doctor if you have any questions or concerns. How do I get ready to quit? Pick a date to quit. Set a date within the next 2 weeks to give you time to prepare. Write down the reasons why you are quitting. Keep this list in places where you will see it often. Tell your family, friends, and co-workers that you are quitting. Their support is important. Talk with your doctor about the choices that may help you quit. Find out if your health insurance will pay for these treatments. Know the people, places, things, and activities that make you want to smoke (triggers). Avoid them. What first steps can I take to quit smoking? Throw away all cigarettes at home, at work, and in your car. Throw away the things that you use when you smoke, such as ashtrays and lighters. Clean your car. Empty the ashtray. Clean your home, including curtains and carpets. What can I do to help me quit smoking? Talk with your doctor about taking medicines and seeing a counselor. You are more likely to succeed when you do both. If you are pregnant or breastfeeding: Talk with your doctor about counseling or other ways to quit smoking. Do not take medicine to help you quit smoking unless your doctor tells you to. Quit right away Quit smoking completely, instead of slowly cutting back on how much you smoke over a period of time. Stopping smoking right away may be more successful than slowly quitting. Go to counseling. In-person is best  if this is an option. You are more likely to quit if you go to counseling sessions regularly. Take medicine You may take medicines to help you quit. Some medicines need a prescription, and some you can buy over-the-counter. Some medicines may contain a drug called nicotine to replace the nicotine in cigarettes. Medicines may: Help you stop having the desire to smoke (cravings). Help to stop the problems that come when you stop smoking (withdrawal symptoms). Your doctor may ask you to use: Nicotine patches, gum, or lozenges. Nicotine inhalers or sprays. Non-nicotine medicine that you take by mouth. Find resources Find resources and other ways to help you quit smoking and remain smoke-free after you quit. They include: Online chats with a counselor. Phone quitlines. Printed self-help materials. Support groups or group counseling. Text messaging programs. Mobile phone apps. Use apps on your mobile phone or tablet that can help you stick to your quit plan. Examples of free services include Quit Guide from the CDC and smokefree.gov  What can I do to make it easier to quit?  Talk to your family and friends. Ask them to support and encourage you. Call a phone quitline, such as 1-800-QUIT-NOW, reach out to support groups, or work with a counselor. Ask people who smoke to not smoke around you. Avoid places that make you want to smoke, such as: Bars. Parties. Smoke-break areas at work. Spend time with people who do not smoke. Lower   the stress in your life. Stress can make you want to smoke. Try these things to lower stress: Getting regular exercise. Doing deep-breathing exercises. Doing yoga. Meditating. What benefits will I see if I quit smoking? Over time, you may have: A better sense of smell and taste. Less coughing and sore throat. A slower heart rate. Lower blood pressure. Clearer skin. Better breathing. Fewer sick days. Summary Quitting smoking can be hard, but it is one of  the best things that you can do for your health. Do not give up if you cannot quit the first time. Some people need to try many times to quit. When you decide to quit smoking, make a plan to help you succeed. Quit smoking right away, not slowly over a period of time. When you start quitting, get help and support to keep you smoke-free. This information is not intended to replace advice given to you by your health care provider. Make sure you discuss any questions you have with your health care provider. Document Revised: 05/22/2021 Document Reviewed: 05/22/2021 Elsevier Patient Education  2023 [employer].  

## 2022-03-30 NOTE — Progress Notes (Signed)
[facility] [phone]   Fax:([phone]  OFFICE PROGRESS NOTE  Koirala, Dibas, MD [address] [place] [address]  DIAGNOSIS: stage IIIC (T4, N3, M0) non-small cell lung cancer, adenocarcinoma presented with large right upper lobe lung mass with associated narrowing and occlusion of the right pulmonary artery superior trunk as well as narrowing of the interlobular right pulmonary artery and encasement and narrowing of the right upper lobe bronchus in addition to right hilar and subcarinal lymphadenopathy and suspicious lesion in the liver.  This was diagnosed in August 2022.  Molecular studies by [employer] showed no actionable mutations.  PRIOR THERAPY: Concurrent chemoradiation with weekly carboplatin for AUC of 2 and paclitaxel 45 Mg/M2.  Status post 6 cycles.  Last dose was given on 03/02/2021.  CURRENT THERAPY: Consolidation treatment with immunotherapy with Imfinzi 1500 Mg IV every 4 weeks.  First dose April 06, 2021.  Status post 8 cycles.  INTERVAL HISTORY: Abigail Clark 46 y.o. female returns to the clinic today for follow-up visit.  The patient was lost to follow-up visit since June 2023.  She mentions that she tried to call several times for an appointment but she did not receive any call back from scheduling.  She denied having any current chest pain, shortness of breath, cough or hemoptysis.  She has no nausea, vomiting, diarrhea or constipation.  She has no headache or visual changes.  She denied having any weight loss or night sweats.  MEDICAL HISTORY: Past Medical History:  Diagnosis Date   Cataract    RIGHT EYE   Eczema    Exotropia of right eye 11/2014   History of radiation therapy    Right Lung- 01/26/21-03/09/21- Dr. Gery Pray   Irritable bowel syndrome (IBS)    no current med.   Lactose intolerance     ALLERGIES:  is allergic to penicillins.  MEDICATIONS:  Current Outpatient Medications   Medication Sig Dispense Refill   cholecalciferol (VITAMIN D3) 25 MCG (1000 UNIT) tablet Take 1,000 Units by mouth daily.     Multiple Vitamin (MULTIVITAMIN) tablet Take 1 tablet by mouth daily.     omeprazole (PRILOSEC) 20 MG capsule Take 20 mg by mouth 2 (two) times daily as needed (indigestion).     prochlorperazine (COMPAZINE) 10 MG tablet Take 1 tablet (10 mg total) by mouth every 6 (six) hours as needed for nausea or vomiting. (Patient not taking: Reported on 04/02/2021) 30 tablet 0   Turmeric 500 MG CAPS Take 1 capsule by mouth daily.     vitamin E 1000 UNIT capsule Take 1,000 Units by mouth daily.     No current facility-administered medications for this visit.    SURGICAL HISTORY:  Past Surgical History:  Procedure Laterality Date   BRONCHIAL BIOPSY  01/12/2021   Procedure: BRONCHIAL BIOPSIES;  Surgeon: Spero Geralds, MD;  Location: [facility] ENDOSCOPY;  Service: Pulmonary;;   BRONCHIAL NEEDLE ASPIRATION BIOPSY  01/12/2021   Procedure: BRONCHIAL NEEDLE ASPIRATION BIOPSIES;  Surgeon: Spero Geralds, MD;  Location: [facility] ENDOSCOPY;  Service: Pulmonary;;   CATARACT PEDIATRIC Right 1986   LEEP  05/20/2006   LIPOSUCTION  10/2014   abd. - local anes.   STRABISMUS SURGERY Right 11/29/2014   Procedure: REPAIR STRABISMUS RIGHT EYE ;  Surgeon: Everitt Amber, MD;  Location: [facility];  Service: Ophthalmology;  Laterality: Right;   VIDEO BRONCHOSCOPY WITH ENDOBRONCHIAL ULTRASOUND N/A 01/12/2021   Procedure: VIDEO BRONCHOSCOPY WITH ENDOBRONCHIAL  ULTRASOUND;  Surgeon: Spero Geralds, MD;  Location: [facility] ENDOSCOPY;  Service: Pulmonary;  Laterality: N/A;   WISDOM TOOTH EXTRACTION      REVIEW OF SYSTEMS:  A comprehensive review of systems was negative.   PHYSICAL EXAMINATION: General appearance: alert, cooperative, and no distress Head: Normocephalic, without obvious abnormality, atraumatic Neck: no adenopathy, no JVD, supple, symmetrical, trachea midline, and thyroid not enlarged,  symmetric, no tenderness/mass/nodules Lymph nodes: Cervical, supraclavicular, and axillary nodes normal. Resp: clear to auscultation bilaterally Back: symmetric, no curvature. ROM normal. No CVA tenderness. Cardio: regular rate and rhythm, S1, S2 normal, no murmur, click, rub or gallop GI: soft, non-tender; bowel sounds normal; no masses,  no organomegaly Extremities: extremities normal, atraumatic, no cyanosis or edema  ECOG PERFORMANCE STATUS: 0 - Asymptomatic  Blood pressure 119/83, pulse (!) 102, temperature 97.9 F (36.6 C), temperature source Oral, resp. rate 16, weight 230 lb 14.4 oz (104.7 kg), SpO2 94 %.  LABORATORY DATA: Lab Results  Component Value Date   WBC 8.6 11/12/2021   HGB 13.5 11/12/2021   HCT 39.5 11/12/2021   MCV 87.0 11/12/2021   PLT 272 11/12/2021      Chemistry      Component Value Date/Time   NA 138 11/12/2021 0957   K 4.1 11/12/2021 0957   CL 106 11/12/2021 0957   CO2 24 11/12/2021 0957   BUN 10 11/12/2021 0957   CREATININE 0.57 11/12/2021 0957      Component Value Date/Time   CALCIUM 10.1 11/12/2021 0957   ALKPHOS 91 11/12/2021 0957   AST 27 11/12/2021 0957   ALT 31 11/12/2021 0957   BILITOT 0.4 11/12/2021 0957       RADIOGRAPHIC STUDIES: No results found.  ASSESSMENT AND PLAN: This is a very pleasant 46 years old African-American female diagnosed with a stage IIIC (T4, N3, M0) non-small cell lung cancer, adenocarcinoma presented with large right upper lobe lung mass with narrowing and occlusion of the right pulmonary artery superior trunk as well as narrowing of the interlobular right pulmonary artery and encasement and narrowing of the right upper lobe bronchus in addition to right hilar and mediastinal lymphadenopathy and suspicious liver lesion. The patient had molecular studies by [employer] that showed no actionable mutation. She had a PET scan performed last week and that showed no evidence for metastatic disease outside the chest  but there was evidence of metastatic disease to the left supraclavicular area. The patient underwent a course of concurrent chemoradiation with weekly carboplatin for AUC of 2 and paclitaxel 45 Mg/M2.  Status post 6 cycles.  Last dose was given on 03/02/2021.  Follow-up CT scan of the chest after the induction treatment showed significant decrease in the size of the right lung mass.  The patient is currently undergoing consolidation treatment with immunotherapy with Imfinzi 1500 Mg IV every 4 weeks status post 8 cycles. The patient was tolerating her treatment well but unfortunately she was lost to follow-up since June 2023. I recommended for her to have repeat CT scan of the chest performed next week. I will see her back for follow-up visit few days after the scan for evaluation and discussion of the scan results and recommendation regarding her condition and if need to resume any additional treatment. She was advised to call immediately if she has any other concerning symptoms in the interval. The patient voices understanding of current disease status and treatment options and is in agreement with the current care plan.  All questions were answered.  The patient knows to call the clinic with any problems, questions or concerns. We can certainly see the patient much sooner if necessary.   Disclaimer: This note was dictated with voice recognition software. Similar sounding words can inadvertently be transcribed and may not be corrected upon review.

## 2022-03-31 ENCOUNTER — Other Ambulatory Visit: Payer: Self-pay

## 2022-04-07 ENCOUNTER — Ambulatory Visit (HOSPITAL_COMMUNITY)
Admission: RE | Admit: 2022-04-07 | Discharge: 2022-04-07 | Disposition: A | Discharge status: Home / Self Care | Payer: Commercial Managed Care - HMO | Source: Ambulatory Visit | Attending: Internal Medicine | Admitting: Internal Medicine

## 2022-04-07 DIAGNOSIS — C3490 Malignant neoplasm of unspecified part of unspecified bronchus or lung: Secondary | ICD-10-CM | POA: Diagnosis present

## 2022-04-07 MED ORDER — IOHEXOL 300 MG/ML  SOLN
100.0000 mL | Freq: Once | INTRAMUSCULAR | Status: AC | PRN
  Administered 2022-04-07: 75 mL via INTRAVENOUS

## 2022-04-13 ENCOUNTER — Inpatient Hospital Stay: Payer: Commercial Managed Care - HMO | Admitting: Internal Medicine

## 2022-04-13 ENCOUNTER — Other Ambulatory Visit: Payer: Self-pay

## 2022-04-13 ENCOUNTER — Encounter: Payer: Self-pay | Admitting: Internal Medicine

## 2022-04-13 VITALS — BP 115/80 | HR 97 | Temp 97.8°F | Resp 18 | Wt 231.4 lb

## 2022-04-13 DIAGNOSIS — C3490 Malignant neoplasm of unspecified part of unspecified bronchus or lung: Secondary | ICD-10-CM | POA: Diagnosis not present

## 2022-04-13 DIAGNOSIS — C3411 Malignant neoplasm of upper lobe, right bronchus or lung: Secondary | ICD-10-CM | POA: Diagnosis not present

## 2022-04-13 NOTE — Progress Notes (Signed)
[facility] Telephone:(336) [phone]   Fax:(336) [phone]  OFFICE PROGRESS NOTE  Koirala, Dibas, MD [address] [place] [address]  DIAGNOSIS: stage IIIC (T4, N3, M0) non-small cell lung cancer, adenocarcinoma presented with large right upper lobe lung mass with associated narrowing and occlusion of the right pulmonary artery superior trunk as well as narrowing of the interlobular right pulmonary artery and encasement and narrowing of the right upper lobe bronchus in addition to right hilar and subcarinal lymphadenopathy and suspicious lesion in the liver.  This was diagnosed in August 2022.  Molecular studies by [employer] showed no actionable mutations.  PRIOR THERAPY:  1) Concurrent chemoradiation with weekly carboplatin for AUC of 2 and paclitaxel 45 Mg/M2.  Status post 6 cycles.  Last dose was given on 03/02/2021. 2) Consolidation treatment with immunotherapy with Imfinzi 1500 Mg IV every 4 weeks.  First dose April 06, 2021.  Status post 8 cycles.  CURRENT THERAPY: Observation.  INTERVAL HISTORY: Abigail Clark 46 y.o. female returns to the clinic today for follow-up visit.  The patient is feeling fine today with no concerning complaints except for occasional chest pain.  She denied having any shortness of breath, cough or hemoptysis.  She has no nausea, vomiting, diarrhea or constipation.  She has no headache or visual changes.  She denied having any recent weight loss or night sweats.  She had repeat CT scan of the chest performed recently and she is here for evaluation and discussion of her scan results.  MEDICAL HISTORY: Past Medical History:  Diagnosis Date   Cataract    RIGHT EYE   Eczema    Exotropia of right eye 11/2014   History of radiation therapy    Right Lung- 01/26/21-03/09/21- Dr. Gery Pray   Irritable bowel syndrome (IBS)    no current med.   Lactose intolerance     ALLERGIES:  is allergic to  penicillins.  MEDICATIONS:  Current Outpatient Medications  Medication Sig Dispense Refill   cholecalciferol (VITAMIN D3) 25 MCG (1000 UNIT) tablet Take 1,000 Units by mouth daily.     Multiple Vitamin (MULTIVITAMIN) tablet Take 1 tablet by mouth daily.     omeprazole (PRILOSEC) 20 MG capsule Take 20 mg by mouth 2 (two) times daily as needed (indigestion).     prochlorperazine (COMPAZINE) 10 MG tablet Take 1 tablet (10 mg total) by mouth every 6 (six) hours as needed for nausea or vomiting. (Patient not taking: Reported on 04/02/2021) 30 tablet 0   Turmeric 500 MG CAPS Take 1 capsule by mouth daily.     vitamin E 1000 UNIT capsule Take 1,000 Units by mouth daily.     No current facility-administered medications for this visit.    SURGICAL HISTORY:  Past Surgical History:  Procedure Laterality Date   BRONCHIAL BIOPSY  01/12/2021   Procedure: BRONCHIAL BIOPSIES;  Surgeon: Spero Geralds, MD;  Location: [facility] ENDOSCOPY;  Service: Pulmonary;;   BRONCHIAL NEEDLE ASPIRATION BIOPSY  01/12/2021   Procedure: BRONCHIAL NEEDLE ASPIRATION BIOPSIES;  Surgeon: Spero Geralds, MD;  Location: [facility] ENDOSCOPY;  Service: Pulmonary;;   CATARACT PEDIATRIC Right 1986   LEEP  05/20/2006   LIPOSUCTION  10/2014   abd. - local anes.   STRABISMUS SURGERY Right 11/29/2014   Procedure: REPAIR STRABISMUS RIGHT EYE ;  Surgeon: Everitt Amber, MD;  Location: [facility];  Service: Ophthalmology;  Laterality: Right;   VIDEO BRONCHOSCOPY WITH ENDOBRONCHIAL ULTRASOUND N/A 01/12/2021  Procedure: VIDEO BRONCHOSCOPY WITH ENDOBRONCHIAL ULTRASOUND;  Surgeon: Spero Geralds, MD;  Location: [facility] ENDOSCOPY;  Service: Pulmonary;  Laterality: N/A;   WISDOM TOOTH EXTRACTION      REVIEW OF SYSTEMS:  Constitutional: negative Eyes: negative Ears, nose, mouth, throat, and face: negative Respiratory: positive for pleurisy/chest pain Cardiovascular: negative Gastrointestinal: negative Genitourinary:negative Integument/breast:  negative Hematologic/lymphatic: negative Musculoskeletal:negative Neurological: negative Behavioral/Psych: negative Endocrine: negative Allergic/Immunologic: negative   PHYSICAL EXAMINATION: General appearance: alert, cooperative, and no distress Head: Normocephalic, without obvious abnormality, atraumatic Neck: no adenopathy, no JVD, supple, symmetrical, trachea midline, and thyroid not enlarged, symmetric, no tenderness/mass/nodules Lymph nodes: Cervical, supraclavicular, and axillary nodes normal. Resp: clear to auscultation bilaterally Back: symmetric, no curvature. ROM normal. No CVA tenderness. Cardio: regular rate and rhythm, S1, S2 normal, no murmur, click, rub or gallop GI: soft, non-tender; bowel sounds normal; no masses,  no organomegaly Extremities: extremities normal, atraumatic, no cyanosis or edema Neurologic: Alert and oriented X 3, normal strength and tone. Normal symmetric reflexes. Normal coordination and gait  ECOG PERFORMANCE STATUS: 0 - Asymptomatic  Blood pressure 115/80, pulse 97, temperature 97.8 F (36.6 C), temperature source Oral, resp. rate 18, weight 231 lb 7 oz (105 kg), SpO2 100 %.  LABORATORY DATA: Lab Results  Component Value Date   WBC 8.9 03/30/2022   HGB 13.7 03/30/2022   HCT 39.8 03/30/2022   MCV 88.1 03/30/2022   PLT 269 03/30/2022      Chemistry      Component Value Date/Time   NA 140 03/30/2022 0816   K 3.8 03/30/2022 0816   CL 108 03/30/2022 0816   CO2 23 03/30/2022 0816   BUN 10 03/30/2022 0816   CREATININE 0.71 03/30/2022 0816      Component Value Date/Time   CALCIUM 9.8 03/30/2022 0816   ALKPHOS 96 03/30/2022 0816   AST 22 03/30/2022 0816   ALT 19 03/30/2022 0816   BILITOT 0.3 03/30/2022 0816       RADIOGRAPHIC STUDIES: CT Chest W Contrast  Result Date: 04/09/2022 CLINICAL DATA:  Non-small-cell lung cancer. Restaging. * Tracking Code: BO * EXAM: CT CHEST WITH CONTRAST TECHNIQUE: Multidetector CT imaging of the  chest was performed during intravenous contrast administration. RADIATION DOSE REDUCTION: This exam was performed according to the departmental dose-optimization program which includes automated exposure control, adjustment of the mA and/or kV according to patient size and/or use of iterative reconstruction technique. CONTRAST:  16mL OMNIPAQUE IOHEXOL 300 MG/ML  SOLN COMPARISON:  09/24/2021 FINDINGS: Cardiovascular: The heart size is normal. No substantial pericardial effusion. No thoracic aortic aneurysm. No substantial atherosclerosis of the thoracic aorta. Aberrant origin right subclavian artery noted. Mediastinum/Nodes: 1.5 cm right thyroid nodule evident, increased in size in the interval. 9 mm short axis precarinal node measured previously is 8 mm short axis today on 67/2. 20 mm right hilar lymph node measured previously is 14 mm today on 67/2. Persistent abnormal soft tissue density in the right hilum and suprahilar right lung. No left hilar lymphadenopathy. The esophagus has normal imaging features. There is no axillary lymphadenopathy. Lungs/Pleura: Centrilobular and paraseptal emphysema evident. Post radiation scarring in the suprahilar right lung is progressive in the interval. Cavitary right upper lobe lesion seen previously is no longer cavitary and measures approximately 4.4 x 3.6 cm, decreased from 6.1 x 4.6 cm previously. This nodule has a more central location in the right upper lobe, likely related to the treatment related volume loss. Tiny right upper lobe pulmonary nodules (image 39/series 5, 58/5, 62/5, 76/5,  as examples) are stable in the interval. No pleural effusion. Upper Abdomen: 2 cm lesion posterior right liver approaches water density, unchanged in the interval and compatible with a cyst. No followup imaging is recommended. Musculoskeletal: No worrisome lytic or sclerotic osseous abnormality. IMPRESSION: 1. Interval decrease in size of the cavitary right upper lobe lesion seen  previously. This lesion has a more central location in the right upper lobe, likely related to the treatment related volume loss. 2. Persistent abnormal soft tissue density in the right hilum and suprahilar right lung, likely a combination of tumor and post treatment change. 3. Interval decrease in size of the right hilar and precarinal lymph nodes. 4. 1.5 cm right thyroid nodule, increased in size in the interval. Recommend thyroid US (ref: J Am Coll Radiol. 2015 Feb;12(2): 143-50). 5. Stable tiny right upper lobe pulmonary nodules. Likely benign, attention on follow-up recommended. 6.  Emphysema (ICD10-J43.9). Electronically Signed   By: Misty Stanley M.D.   On: 04/09/2022 15:16    ASSESSMENT AND PLAN: This is a very pleasant 46 years old African-American female diagnosed with a stage IIIC (T4, N3, M0) non-small cell lung cancer, adenocarcinoma presented with large right upper lobe lung mass with narrowing and occlusion of the right pulmonary artery superior trunk as well as narrowing of the interlobular right pulmonary artery and encasement and narrowing of the right upper lobe bronchus in addition to right hilar and mediastinal lymphadenopathy and suspicious liver lesion. The patient had molecular studies by [employer] that showed no actionable mutation. She had a PET scan performed last week and that showed no evidence for metastatic disease outside the chest but there was evidence of metastatic disease to the left supraclavicular area. The patient underwent a course of concurrent chemoradiation with weekly carboplatin for AUC of 2 and paclitaxel 45 Mg/M2.  Status post 6 cycles.  Last dose was given on 03/02/2021.  Follow-up CT scan of the chest after the induction treatment showed significant decrease in the size of the right lung mass.  The patient is currently undergoing consolidation treatment with immunotherapy with Imfinzi 1500 Mg IV every 4 weeks status post 8 cycles. The patient was tolerating  her treatment well but unfortunately she was lost to follow-up since June 2023. She had repeat CT scan of the chest performed recently.  I personally and independently reviewed the scan and discussed the result with the patient today. Her scan showed no concerning findings for disease progression and there was continuous mild improvement of her disease.  She has slightly enlarging thyroid nodules that need close monitoring. I recommended for the patient to continue on observation for now with repeat CT scan of the chest in 3 months. The patient was advised to call immediately if she has any other concerning symptoms in the interval. The patient voices understanding of current disease status and treatment options and is in agreement with the current care plan.  All questions were answered. The patient knows to call the clinic with any problems, questions or concerns. We can certainly see the patient much sooner if necessary.   Disclaimer: This note was dictated with voice recognition software. Similar sounding words can inadvertently be transcribed and may not be corrected upon review.

## 2022-04-13 NOTE — Patient Instructions (Signed)
Steps to Quit Smoking Smoking tobacco is the leading cause of preventable death. It can affect almost every organ in the body. Smoking puts you and people around you at risk for many serious, long-lasting (chronic) diseases. Quitting smoking can be hard, but it is one of the best things that you can do for your health. It is never too late to quit. Do not give up if you cannot quit the first time. Some people need to try many times to quit. Do your best to stick to your quit plan, and talk with your doctor if you have any questions or concerns. How do I get ready to quit? Pick a date to quit. Set a date within the next 2 weeks to give you time to prepare. Write down the reasons why you are quitting. Keep this list in places where you will see it often. Tell your family, friends, and co-workers that you are quitting. Their support is important. Talk with your doctor about the choices that may help you quit. Find out if your health insurance will pay for these treatments. Know the people, places, things, and activities that make you want to smoke (triggers). Avoid them. What first steps can I take to quit smoking? Throw away all cigarettes at home, at work, and in your car. Throw away the things that you use when you smoke, such as ashtrays and lighters. Clean your car. Empty the ashtray. Clean your home, including curtains and carpets. What can I do to help me quit smoking? Talk with your doctor about taking medicines and seeing a counselor. You are more likely to succeed when you do both. If you are pregnant or breastfeeding: Talk with your doctor about counseling or other ways to quit smoking. Do not take medicine to help you quit smoking unless your doctor tells you to. Quit right away Quit smoking completely, instead of slowly cutting back on how much you smoke over a period of time. Stopping smoking right away may be more successful than slowly quitting. Go to counseling. In-person is best  if this is an option. You are more likely to quit if you go to counseling sessions regularly. Take medicine You may take medicines to help you quit. Some medicines need a prescription, and some you can buy over-the-counter. Some medicines may contain a drug called nicotine to replace the nicotine in cigarettes. Medicines may: Help you stop having the desire to smoke (cravings). Help to stop the problems that come when you stop smoking (withdrawal symptoms). Your doctor may ask you to use: Nicotine patches, gum, or lozenges. Nicotine inhalers or sprays. Non-nicotine medicine that you take by mouth. Find resources Find resources and other ways to help you quit smoking and remain smoke-free after you quit. They include: Online chats with a counselor. Phone quitlines. Printed self-help materials. Support groups or group counseling. Text messaging programs. Mobile phone apps. Use apps on your mobile phone or tablet that can help you stick to your quit plan. Examples of free services include Quit Guide from the CDC and smokefree.gov  What can I do to make it easier to quit?  Talk to your family and friends. Ask them to support and encourage you. Call a phone quitline, such as 1-800-QUIT-NOW, reach out to support groups, or work with a counselor. Ask people who smoke to not smoke around you. Avoid places that make you want to smoke, such as: Bars. Parties. Smoke-break areas at work. Spend time with people who do not smoke. Lower   the stress in your life. Stress can make you want to smoke. Try these things to lower stress: Getting regular exercise. Doing deep-breathing exercises. Doing yoga. Meditating. What benefits will I see if I quit smoking? Over time, you may have: A better sense of smell and taste. Less coughing and sore throat. A slower heart rate. Lower blood pressure. Clearer skin. Better breathing. Fewer sick days. Summary Quitting smoking can be hard, but it is one of  the best things that you can do for your health. Do not give up if you cannot quit the first time. Some people need to try many times to quit. When you decide to quit smoking, make a plan to help you succeed. Quit smoking right away, not slowly over a period of time. When you start quitting, get help and support to keep you smoke-free. This information is not intended to replace advice given to you by your health care provider. Make sure you discuss any questions you have with your health care provider. Document Revised: 05/22/2021 Document Reviewed: 05/22/2021 Elsevier Patient Education  2023 [employer].  

## 2022-04-14 ENCOUNTER — Other Ambulatory Visit: Payer: Self-pay

## 2022-05-25 ENCOUNTER — Other Ambulatory Visit: Payer: Self-pay | Admitting: Internal Medicine

## 2022-06-17 ENCOUNTER — Telehealth: Payer: Self-pay | Admitting: Internal Medicine

## 2022-06-17 NOTE — Telephone Encounter (Signed)
Called patient regarding January appointments, patient's voicemail is full. Calendar will be mailed.

## 2022-06-23 ENCOUNTER — Telehealth: Payer: Self-pay | Admitting: Internal Medicine

## 2022-06-23 NOTE — Telephone Encounter (Signed)
Called patient per 1/10 high priority in basket. Informed patient of already scheduled appointments and transferred patient to scheduling like for scan.

## 2022-07-08 ENCOUNTER — Ambulatory Visit (HOSPITAL_COMMUNITY)
Admission: RE | Admit: 2022-07-08 | Discharge: 2022-07-08 | Disposition: A | Discharge status: Home / Self Care | Payer: Commercial Managed Care - HMO | Source: Ambulatory Visit | Attending: Internal Medicine | Admitting: Internal Medicine

## 2022-07-08 DIAGNOSIS — C3490 Malignant neoplasm of unspecified part of unspecified bronchus or lung: Secondary | ICD-10-CM | POA: Insufficient documentation

## 2022-07-08 MED ORDER — SODIUM CHLORIDE (PF) 0.9 % IJ SOLN
INTRAMUSCULAR | Status: AC
  Filled 2022-07-08: qty 50

## 2022-07-08 MED ORDER — IOHEXOL 300 MG/ML  SOLN
75.0000 mL | Freq: Once | INTRAMUSCULAR | Status: AC | PRN
  Administered 2022-07-08: 75 mL via INTRAVENOUS

## 2022-07-12 ENCOUNTER — Inpatient Hospital Stay: Payer: Commercial Managed Care - HMO | Attending: Internal Medicine

## 2022-07-12 DIAGNOSIS — C3490 Malignant neoplasm of unspecified part of unspecified bronchus or lung: Secondary | ICD-10-CM

## 2022-07-12 DIAGNOSIS — Z923 Personal history of irradiation: Secondary | ICD-10-CM | POA: Diagnosis not present

## 2022-07-12 DIAGNOSIS — C3411 Malignant neoplasm of upper lobe, right bronchus or lung: Secondary | ICD-10-CM | POA: Diagnosis present

## 2022-07-12 LAB — CBC WITH DIFFERENTIAL (CANCER CENTER ONLY)
Abs Immature Granulocytes: 0.04 10*3/uL (ref 0.00–0.07)
Basophils Absolute: 0.1 10*3/uL (ref 0.0–0.1)
Basophils Relative: 0 %
Eosinophils Absolute: 0.3 10*3/uL (ref 0.0–0.5)
Eosinophils Relative: 3 %
HCT: 41.2 % (ref 36.0–46.0)
Hemoglobin: 14 g/dL (ref 12.0–15.0)
Immature Granulocytes: 0 %
Lymphocytes Relative: 17 %
Lymphs Abs: 2 10*3/uL (ref 0.7–4.0)
MCH: 29.7 pg (ref 26.0–34.0)
MCHC: 34 g/dL (ref 30.0–36.0)
MCV: 87.5 fL (ref 80.0–100.0)
Monocytes Absolute: 0.7 10*3/uL (ref 0.1–1.0)
Monocytes Relative: 6 %
Neutro Abs: 8.7 10*3/uL — ABNORMAL HIGH (ref 1.7–7.7)
Neutrophils Relative %: 74 %
Platelet Count: 305 10*3/uL (ref 150–400)
RBC: 4.71 MIL/uL (ref 3.87–5.11)
RDW: 13.2 % (ref 11.5–15.5)
WBC Count: 11.8 10*3/uL — ABNORMAL HIGH (ref 4.0–10.5)
nRBC: 0 % (ref 0.0–0.2)

## 2022-07-12 LAB — CMP (CANCER CENTER ONLY)
ALT: 17 U/L (ref 0–44)
AST: 17 U/L (ref 15–41)
Albumin: 4.4 g/dL (ref 3.5–5.0)
Alkaline Phosphatase: 92 U/L (ref 38–126)
Anion gap: 8 (ref 5–15)
BUN: 11 mg/dL (ref 6–20)
CO2: 26 mmol/L (ref 22–32)
Calcium: 10 mg/dL (ref 8.9–10.3)
Chloride: 105 mmol/L (ref 98–111)
Creatinine: 0.78 mg/dL (ref 0.44–1.00)
GFR, Estimated: >60 mL/min (ref >60)
Glucose, Bld: 120 mg/dL — ABNORMAL HIGH (ref 70–99)
Potassium: 3.7 mmol/L (ref 3.5–5.1)
Sodium: 139 mmol/L (ref 135–145)
Total Bilirubin: 0.3 mg/dL (ref 0.3–1.2)
Total Protein: 8.2 g/dL — ABNORMAL HIGH (ref 6.5–8.1)

## 2022-07-14 ENCOUNTER — Other Ambulatory Visit: Payer: Self-pay

## 2022-07-14 ENCOUNTER — Inpatient Hospital Stay: Payer: Commercial Managed Care - HMO | Admitting: Internal Medicine

## 2022-07-14 VITALS — BP 126/82 | HR 108 | Temp 97.6°F | Resp 17 | Ht 67.0 in | Wt 240.3 lb

## 2022-07-14 DIAGNOSIS — C3490 Malignant neoplasm of unspecified part of unspecified bronchus or lung: Secondary | ICD-10-CM | POA: Diagnosis not present

## 2022-07-14 DIAGNOSIS — C3411 Malignant neoplasm of upper lobe, right bronchus or lung: Secondary | ICD-10-CM | POA: Diagnosis not present

## 2022-07-14 NOTE — Progress Notes (Signed)
[place] [phone]   [phone]) [phone]  OFFICE PROGRESS NOTE  Koirala, Dibas, MD [address] [place] [address]  DIAGNOSIS: stage IIIC (T4, N3, M0) non-small cell lung cancer, adenocarcinoma presented with large right upper lobe lung mass with associated narrowing and occlusion of the right pulmonary artery superior trunk as well as narrowing of the interlobular right pulmonary artery and encasement and narrowing of the right upper lobe bronchus in addition to right hilar and subcarinal lymphadenopathy and suspicious lesion in the liver.  This was diagnosed in August 2022.  Molecular studies by [employer] showed no actionable mutations.  PRIOR THERAPY:  1) Concurrent chemoradiation with weekly carboplatin for AUC of 2 and paclitaxel 45 Mg/M2.  Status post 6 cycles.  Last dose was given on 03/02/2021. 2) Consolidation treatment with immunotherapy with Imfinzi 1500 Mg IV every 4 weeks.  First dose April 06, 2021.  Status post 8 cycles.  CURRENT THERAPY: Observation.  INTERVAL HISTORY: Abigail Clark 47 y.o. female returns to the clinic today for 42-month follow-up visit.  The patient is feeling fine today with no concerning complaints except for mild pain in her lower extremities especially in the morning but improves as the day goes.  She denied having any current chest pain, shortness of breath, cough or hemoptysis.  She has no nausea, vomiting, diarrhea or constipation.  She has no headache or visual changes.  She has no recent weight loss or night sweats.  She is here today for evaluation with repeat CT scan of the chest for restaging of her disease.   MEDICAL HISTORY: Past Medical History:  Diagnosis Date   Cataract    RIGHT EYE   Eczema    Exotropia of right eye 11/2014   History of radiation therapy    Right Lung- 01/26/21-03/09/21- Dr. Gery Pray   Irritable bowel syndrome (IBS)    no current med.   Lactose  intolerance     ALLERGIES:  is allergic to penicillins.  MEDICATIONS:  Current Outpatient Medications  Medication Sig Dispense Refill   cholecalciferol (VITAMIN D3) 25 MCG (1000 UNIT) tablet Take 1,000 Units by mouth daily.     Multiple Vitamin (MULTIVITAMIN) tablet Take 1 tablet by mouth daily.     omeprazole (PRILOSEC) 20 MG capsule Take 20 mg by mouth 2 (two) times daily as needed (indigestion).     prochlorperazine (COMPAZINE) 10 MG tablet Take 1 tablet (10 mg total) by mouth every 6 (six) hours as needed for nausea or vomiting. (Patient not taking: Reported on 04/02/2021) 30 tablet 0   Turmeric 500 MG CAPS Take 1 capsule by mouth daily.     vitamin E 1000 UNIT capsule Take 1,000 Units by mouth daily.     No current facility-administered medications for this visit.    SURGICAL HISTORY:  Past Surgical History:  Procedure Laterality Date   BRONCHIAL BIOPSY  01/12/2021   Procedure: BRONCHIAL BIOPSIES;  Surgeon: Spero Geralds, MD;  Location: [facility] ENDOSCOPY;  Service: Pulmonary;;   BRONCHIAL NEEDLE ASPIRATION BIOPSY  01/12/2021   Procedure: BRONCHIAL NEEDLE ASPIRATION BIOPSIES;  Surgeon: Spero Geralds, MD;  Location: [facility] ENDOSCOPY;  Service: Pulmonary;;   CATARACT PEDIATRIC Right 1986   LEEP  05/20/2006   LIPOSUCTION  10/2014   abd. - local anes.   STRABISMUS SURGERY Right 11/29/2014   Procedure: REPAIR STRABISMUS RIGHT EYE ;  Surgeon: Everitt Amber, MD;  Location: [facility];  Service:  Ophthalmology;  Laterality: Right;   VIDEO BRONCHOSCOPY WITH ENDOBRONCHIAL ULTRASOUND N/A 01/12/2021   Procedure: VIDEO BRONCHOSCOPY WITH ENDOBRONCHIAL ULTRASOUND;  Surgeon: Spero Geralds, MD;  Location: [facility] ENDOSCOPY;  Service: Pulmonary;  Laterality: N/A;   WISDOM TOOTH EXTRACTION      REVIEW OF SYSTEMS:  A comprehensive review of systems was negative except for: Constitutional: positive for fatigue   PHYSICAL EXAMINATION: General appearance: alert, cooperative, and no distress Head:  Normocephalic, without obvious abnormality, atraumatic Neck: no adenopathy, no JVD, supple, symmetrical, trachea midline, and thyroid not enlarged, symmetric, no tenderness/mass/nodules Lymph nodes: Cervical, supraclavicular, and axillary nodes normal. Resp: clear to auscultation bilaterally Back: symmetric, no curvature. ROM normal. No CVA tenderness. Cardio: regular rate and rhythm, S1, S2 normal, no murmur, click, rub or gallop GI: soft, non-tender; bowel sounds normal; no masses,  no organomegaly Extremities: extremities normal, atraumatic, no cyanosis or edema  ECOG PERFORMANCE STATUS: 0 - Asymptomatic  Blood pressure 126/82, pulse (!) 108, temperature 97.6 F (36.4 C), temperature source Temporal, resp. rate 17, height 5\' 7"  (1.702 m), weight 240 lb 4.8 oz (109 kg), SpO2 94 %.  LABORATORY DATA: Lab Results  Component Value Date   WBC 11.8 (H) 07/12/2022   HGB 14.0 07/12/2022   HCT 41.2 07/12/2022   MCV 87.5 07/12/2022   PLT 305 07/12/2022      Chemistry      Component Value Date/Time   NA 139 07/12/2022 1008   K 3.7 07/12/2022 1008   CL 105 07/12/2022 1008   CO2 26 07/12/2022 1008   BUN 11 07/12/2022 1008   CREATININE 0.78 07/12/2022 1008      Component Value Date/Time   CALCIUM 10.0 07/12/2022 1008   ALKPHOS 92 07/12/2022 1008   AST 17 07/12/2022 1008   ALT 17 07/12/2022 1008   BILITOT 0.3 07/12/2022 1008       RADIOGRAPHIC STUDIES: CT Chest W Contrast  Result Date: 07/09/2022 CLINICAL DATA:  Restaging non-small cell lung cancer. * Tracking Code: BO * EXAM: CT CHEST WITH CONTRAST TECHNIQUE: Multidetector CT imaging of the chest was performed during intravenous contrast administration. RADIATION DOSE REDUCTION: This exam was performed according to the departmental dose-optimization program which includes automated exposure control, adjustment of the mA and/or kV according to patient size and/or use of iterative reconstruction technique. CONTRAST:  40mL OMNIPAQUE  IOHEXOL 300 MG/ML  SOLN COMPARISON:  Numerous prior imaging studies. The most recent CT scan is 04/07/2022. Prior PET-CT 02/02/2021 FINDINGS: Cardiovascular: The heart is normal in size. No pericardial effusion. The aorta is normal in caliber. No dissection. Stable scratch again noted is an aberrant right subclavian artery. Mediastinum/Nodes: Stable sub 8 mm mediastinal lymph nodes. Persistent large right suprahilar mass surrounding the right upper lobe bronchus and bronchus intermedius. No obvious increase in size. Probable further necrosis which could be post treatment change. The esophagus is grossly normal. Lungs/Pleura: Overall slightly smaller central right upper lobe lung mass involving the right suprahilar region. This measures approximately 4.0 x 3.3 cm on image 49/5 and previously measured 4.4 x 3.6 cm. Surrounding interstitial changes likely radiation fibrosis. No findings to suggest progressive tumor. Stable significant underlying emphysematous changes and pulmonary scarring. Stable 6 mm sub solid nodule at the right lung apex on image 35/5. No new pulmonary lesions or pulmonary nodules. No pleural effusions or pleural nodules. Upper Abdomen: No significant upper abdominal findings. Stable area hyperenhancement in the left hepatic lobe is likely some type of vascular shunt. This is unchanged when looking back  at multiple prior studies with variable appearance depending on the contrast timing. Stable hepatic cysts. No adrenal gland lesions. Musculoskeletal: No breast masses, supraclavicular or axillary adenopathy. Stable small subcentimeter thyroid nodules. No significant bony findings. IMPRESSION: 1. Overall slightly smaller central right upper lobe lung mass involving the right suprahilar region. Probable further necrosis which could be post treatment change. No findings to suggest progressive tumor. 2. Stable sub 8 mm mediastinal lymph nodes. 3. Stable significant emphysematous changes and pulmonary  scarring. 4. Stable 6 mm sub solid nodule at the right lung apex. No new pulmonary lesions or pulmonary nodules. 5. No findings for upper abdominal metastatic disease. 6. Stable area of hyperenhancement in the left hepatic lobe is likely some type of vascular shunt. This is unchanged when looking back at multiple prior studies with variable appearance depending on the contrast timing. Emphysema (ICD10-J43.9). Electronically Signed   By: Marijo Sanes M.D.   On: 07/09/2022 08:31    ASSESSMENT AND PLAN: This is a very pleasant 47 years old African-American female diagnosed with a stage IIIC (T4, N3, M0) non-small cell lung cancer, adenocarcinoma presented with large right upper lobe lung mass with narrowing and occlusion of the right pulmonary artery superior trunk as well as narrowing of the interlobular right pulmonary artery and encasement and narrowing of the right upper lobe bronchus in addition to right hilar and mediastinal lymphadenopathy and suspicious liver lesion. The patient had molecular studies by [employer] that showed no actionable mutation. She had a PET scan performed last week and that showed no evidence for metastatic disease outside the chest but there was evidence of metastatic disease to the left supraclavicular area. The patient underwent a course of concurrent chemoradiation with weekly carboplatin for AUC of 2 and paclitaxel 45 Mg/M2.  Status post 6 cycles.  Last dose was given on 03/02/2021.  Follow-up CT scan of the chest after the induction treatment showed significant decrease in the size of the right lung mass.  The patient is currently undergoing consolidation treatment with immunotherapy with Imfinzi 1500 Mg IV every 4 weeks status post 8 cycles. The patient was tolerating her treatment well but unfortunately she was lost to follow-up since June 2023. The patient is currently on observation and she is feeling fine with no concerning complaints except for mild fatigue. She had  repeat CT scan of the chest performed recently.  I personally and independently reviewed the scan and discussed results with the patient today. Her scan showed no concerning findings for disease recurrence or metastasis. I recommended for her to continue on observation with repeat CT scan of the chest in 3 months. She was advised to call immediately if she has any concerning symptoms in the interval. The patient voices understanding of current disease status and treatment options and is in agreement with the current care plan.  All questions were answered. The patient knows to call the clinic with any problems, questions or concerns. We can certainly see the patient much sooner if necessary.   Disclaimer: This note was dictated with voice recognition software. Similar sounding words can inadvertently be transcribed and may not be corrected upon review.

## 2022-09-08 ENCOUNTER — Telehealth: Payer: Self-pay | Admitting: Internal Medicine

## 2022-09-08 NOTE — Telephone Encounter (Signed)
Called patient regarding April appointments, voicemail is full. Calendar will be mailed.

## 2022-10-07 ENCOUNTER — Ambulatory Visit (HOSPITAL_COMMUNITY)
Admission: RE | Admit: 2022-10-07 | Discharge: 2022-10-07 | Disposition: A | Discharge status: Home / Self Care | Payer: 59 | Source: Ambulatory Visit | Attending: Internal Medicine | Admitting: Internal Medicine

## 2022-10-07 DIAGNOSIS — C3490 Malignant neoplasm of unspecified part of unspecified bronchus or lung: Secondary | ICD-10-CM | POA: Insufficient documentation

## 2022-10-07 DIAGNOSIS — J9811 Atelectasis: Secondary | ICD-10-CM | POA: Diagnosis not present

## 2022-10-07 DIAGNOSIS — J432 Centrilobular emphysema: Secondary | ICD-10-CM | POA: Diagnosis not present

## 2022-10-07 MED ORDER — IOHEXOL 300 MG/ML  SOLN
75.0000 mL | Freq: Once | INTRAMUSCULAR | Status: AC | PRN
  Administered 2022-10-07: 100 mL via INTRAVENOUS

## 2022-10-08 ENCOUNTER — Inpatient Hospital Stay: Payer: 59 | Attending: Internal Medicine

## 2022-10-08 ENCOUNTER — Other Ambulatory Visit: Payer: Self-pay

## 2022-10-08 DIAGNOSIS — C3411 Malignant neoplasm of upper lobe, right bronchus or lung: Secondary | ICD-10-CM | POA: Diagnosis not present

## 2022-10-08 DIAGNOSIS — K769 Liver disease, unspecified: Secondary | ICD-10-CM | POA: Insufficient documentation

## 2022-10-08 DIAGNOSIS — C3490 Malignant neoplasm of unspecified part of unspecified bronchus or lung: Secondary | ICD-10-CM

## 2022-10-08 LAB — CBC WITH DIFFERENTIAL (CANCER CENTER ONLY)
Abs Immature Granulocytes: 0.02 10*3/uL (ref 0.00–0.07)
Basophils Absolute: 0 10*3/uL (ref 0.0–0.1)
Basophils Relative: 1 %
Eosinophils Absolute: 0.3 10*3/uL (ref 0.0–0.5)
Eosinophils Relative: 3 %
HCT: 41.6 % (ref 36.0–46.0)
Hemoglobin: 13.8 g/dL (ref 12.0–15.0)
Immature Granulocytes: 0 %
Lymphocytes Relative: 22 %
Lymphs Abs: 1.9 10*3/uL (ref 0.7–4.0)
MCH: 29.5 pg (ref 26.0–34.0)
MCHC: 33.2 g/dL (ref 30.0–36.0)
MCV: 88.9 fL (ref 80.0–100.0)
Monocytes Absolute: 0.4 10*3/uL (ref 0.1–1.0)
Monocytes Relative: 5 %
Neutro Abs: 5.9 10*3/uL (ref 1.7–7.7)
Neutrophils Relative %: 69 %
Platelet Count: 339 10*3/uL (ref 150–400)
RBC: 4.68 MIL/uL (ref 3.87–5.11)
RDW: 13.4 % (ref 11.5–15.5)
WBC Count: 8.5 10*3/uL (ref 4.0–10.5)
nRBC: 0 % (ref 0.0–0.2)

## 2022-10-08 LAB — CMP (CANCER CENTER ONLY)
ALT: 15 U/L (ref 0–44)
AST: 15 U/L (ref 15–41)
Albumin: 4.6 g/dL (ref 3.5–5.0)
Alkaline Phosphatase: 98 U/L (ref 38–126)
Anion gap: 8 (ref 5–15)
BUN: 10 mg/dL (ref 6–20)
CO2: 27 mmol/L (ref 22–32)
Calcium: 10.1 mg/dL (ref 8.9–10.3)
Chloride: 104 mmol/L (ref 98–111)
Creatinine: 0.79 mg/dL (ref 0.44–1.00)
GFR, Estimated: >60 mL/min (ref >60)
Glucose, Bld: 134 mg/dL — ABNORMAL HIGH (ref 70–99)
Potassium: 4 mmol/L (ref 3.5–5.1)
Sodium: 139 mmol/L (ref 135–145)
Total Bilirubin: 0.4 mg/dL (ref 0.3–1.2)
Total Protein: 8.2 g/dL — ABNORMAL HIGH (ref 6.5–8.1)

## 2022-10-12 ENCOUNTER — Other Ambulatory Visit: Payer: Self-pay

## 2022-10-12 ENCOUNTER — Inpatient Hospital Stay (HOSPITAL_BASED_OUTPATIENT_CLINIC_OR_DEPARTMENT_OTHER): Payer: 59 | Admitting: Internal Medicine

## 2022-10-12 VITALS — BP 115/73 | HR 94 | Temp 97.3°F | Resp 16 | Wt 235.6 lb

## 2022-10-12 DIAGNOSIS — C3490 Malignant neoplasm of unspecified part of unspecified bronchus or lung: Secondary | ICD-10-CM

## 2022-10-12 DIAGNOSIS — C3411 Malignant neoplasm of upper lobe, right bronchus or lung: Secondary | ICD-10-CM | POA: Diagnosis not present

## 2022-10-12 DIAGNOSIS — K769 Liver disease, unspecified: Secondary | ICD-10-CM | POA: Diagnosis not present

## 2022-10-12 NOTE — Progress Notes (Signed)
[facility] [phone]   Fax:([phone]  OFFICE PROGRESS NOTE  Clark, Dibas, MD [address] [place] [address]  DIAGNOSIS: stage IIIC (T4, N3, M0) non-small cell lung cancer, adenocarcinoma presented with large right upper lobe lung mass with associated narrowing and occlusion of the right pulmonary artery superior trunk as well as narrowing of the interlobular right pulmonary artery and encasement and narrowing of the right upper lobe bronchus in addition to right hilar and subcarinal lymphadenopathy and suspicious lesion in the liver.  This was diagnosed in August 2022.  Molecular studies by [employer] showed no actionable mutations.  PRIOR THERAPY:  1) Concurrent chemoradiation with weekly carboplatin for AUC of 2 and paclitaxel 45 Mg/M2.  Status post 6 cycles.  Last dose was given on 03/02/2021. 2) Consolidation treatment with immunotherapy with Imfinzi 1500 Mg IV every 4 weeks.  First dose April 06, 2021.  Status post 8 cycles.  CURRENT THERAPY: Observation.  INTERVAL HISTORY: Abigail Clark 47 y.o. female returns to the clinic today for 3 months follow-up visit.  The patient is feeling fine today with no concerning complaints.  She denied having any significant fatigue or weakness.  She has no nausea, vomiting, diarrhea or constipation.  She has no headache or visual changes.  She denied having any significant weight loss or night sweats.  She has no chest pain, shortness of breath, cough or hemoptysis.  She is here today for evaluation with repeat CT scan of the chest for restaging of her disease.  MEDICAL HISTORY: Past Medical History:  Diagnosis Date   Cataract    RIGHT EYE   Eczema    Exotropia of right eye 11/2014   History of radiation therapy    Right Lung- 01/26/21-03/09/21- Dr. Antony Blackbird   Irritable bowel syndrome (IBS)    no current med.   Lactose intolerance     ALLERGIES:  is allergic to  penicillins.  MEDICATIONS:  Current Outpatient Medications  Medication Sig Dispense Refill   cholecalciferol (VITAMIN D3) 25 MCG (1000 UNIT) tablet Take 1,000 Units by mouth daily.     Multiple Vitamin (MULTIVITAMIN) tablet Take 1 tablet by mouth daily.     omeprazole (PRILOSEC) 20 MG capsule Take 20 mg by mouth 2 (two) times daily as needed (indigestion).     prochlorperazine (COMPAZINE) 10 MG tablet Take 1 tablet (10 mg total) by mouth every 6 (six) hours as needed for nausea or vomiting. (Patient not taking: Reported on 04/02/2021) 30 tablet 0   Turmeric 500 MG CAPS Take 1 capsule by mouth daily.     vitamin E 1000 UNIT capsule Take 1,000 Units by mouth daily.     No current facility-administered medications for this visit.    SURGICAL HISTORY:  Past Surgical History:  Procedure Laterality Date   BRONCHIAL BIOPSY  01/12/2021   Procedure: BRONCHIAL BIOPSIES;  Surgeon: Charlott Holler, MD;  Location[facility] ENDOSCOPY;  Service: Pulmonary;;   BRONCHIAL NEEDLE ASPIRATION BIOPSY  01/12/2021   Procedure: BRONCHIAL NEEDLE ASPIRATION BIOPSIES;  Surgeon: Charlott Holler, MD;  Location: [facility] ENDOSCOPY;  Service: Pulmonary;;   CATARACT PEDIATRIC Right 1986   LEEP  05/20/2006   LIPOSUCTION  10/2014   abd. - local anes.   STRABISMUS SURGERY Right 11/29/2014   Procedure: REPAIR STRABISMUS RIGHT EYE ;  Surgeon: Verne Carrow, MD;  Location: [facility];  Service: Ophthalmology;  Laterality: Right;   VIDEO BRONCHOSCOPY WITH ENDOBRONCHIAL  ULTRASOUND N/A 01/12/2021   Procedure: VIDEO BRONCHOSCOPY WITH ENDOBRONCHIAL ULTRASOUND;  Surgeon: Charlott Holler, MD;  Location: [facility] ENDOSCOPY;  Service: Pulmonary;  Laterality: N/A;   WISDOM TOOTH EXTRACTION      REVIEW OF SYSTEMS:  A comprehensive review of systems was negative.   PHYSICAL EXAMINATION: General appearance: alert, cooperative, and no distress Head: Normocephalic, without obvious abnormality, atraumatic Neck: no adenopathy, no JVD, supple,  symmetrical, trachea midline, and thyroid not enlarged, symmetric, no tenderness/mass/nodules Lymph nodes: Cervical, supraclavicular, and axillary nodes normal. Resp: clear to auscultation bilaterally Back: symmetric, no curvature. ROM normal. No CVA tenderness. Cardio: regular rate and rhythm, S1, S2 normal, no murmur, click, rub or gallop GI: soft, non-tender; bowel sounds normal; no masses,  no organomegaly Extremities: extremities normal, atraumatic, no cyanosis or edema  ECOG PERFORMANCE STATUS: 0 - Asymptomatic  Blood pressure 115/73, pulse 94, temperature (!) 97.3 F (36.3 C), temperature source Temporal, resp. rate 16, weight 235 lb 9.6 oz (106.9 kg), SpO2 99 %.  LABORATORY DATA: Lab Results  Component Value Date   WBC 8.5 10/08/2022   HGB 13.8 10/08/2022   HCT 41.6 10/08/2022   MCV 88.9 10/08/2022   PLT 339 10/08/2022      Chemistry      Component Value Date/Time   NA 139 10/08/2022 1057   K 4.0 10/08/2022 1057   CL 104 10/08/2022 1057   CO2 27 10/08/2022 1057   BUN 10 10/08/2022 1057   CREATININE 0.79 10/08/2022 1057      Component Value Date/Time   CALCIUM 10.1 10/08/2022 1057   ALKPHOS 98 10/08/2022 1057   AST 15 10/08/2022 1057   ALT 15 10/08/2022 1057   BILITOT 0.4 10/08/2022 1057       RADIOGRAPHIC STUDIES: CT Chest W Contrast  Result Date: 10/11/2022 CLINICAL DATA:  Staging non-small-cell lung cancer. * Tracking Code: BO * EXAM: CT CHEST WITH CONTRAST TECHNIQUE: Multidetector CT imaging of the chest was performed during intravenous contrast administration. RADIATION DOSE REDUCTION: This exam was performed according to the departmental dose-optimization program which includes automated exposure control, adjustment of the mA and/or kV according to patient size and/or use of iterative reconstruction technique. CONTRAST:  OMNIPAQUE IOHEXOL 300 MG/ML  SOLN COMPARISON:  CT 07/08/2022 and older FINDINGS: Cardiovascular: The thoracic aorta has a normal  course and caliber with mild atherosclerotic plaque. There is a normal variant of an aberrant right subclavian artery. Heart is nonenlarged. No significant pericardial effusion. Mediastinum/Nodes: Heterogeneous thyroid gland with some cystic nodules, unchanged from previous. Slight wall thickening along the course of the midthoracic esophagus, nonspecific and unchanged. There is no specific abnormal lymph node enlargement identified in the axillary regions, hilum or mediastinum. The small pretracheal node which previously measured 7 mm in short axis, today on series 2, image 46 measures 7 mm. There is confluence soft tissue involving the right lung hilum however extending into the lung as on the prior examination. Lungs/Pleura: Confluence soft tissue mass in the right upper lobe extending back to involve the hilum in the margin of the mediastinum is again seen. On the prior area extending to the edge of the hilum was measured at 4.1 x 3.4 cm and today this area when measured in the same fashion continuity would measure 4.0 x 3.6 cm, not significantly changed. Opacities are some spiculated extend out to the edge of the pleura posteriorly with pleural thickening, unchanged. There is some debris in the right upper lobe bronchus. Elsewhere there is some dependent atelectasis  in the left lung base. Underlying centrilobular emphysematous changes. No pneumothorax or effusion. The 6 mm right apical nodule posteriorly on the prior examination, today is unchanged on series 7, image 24. Few other stable tiny right apical areas. There is also similar ill-defined area of posterior left upper lobe on series 7, image 45 which is stable as well. No new lung nodularity. Some breathing motion. Upper Abdomen: Fatty liver infiltration. Stable multiple hepatic cysts as described previously. The geographic area of higher density in the left hepatic lobe abutting the margin of the falciform ligament is stable today measuring 3.6 x 2.7 cm.  This has been stable since at least 2022 and could be an area of fatty sparing versus a benign lesion. This area did not show abnormal uptake on prior PET-CT. Preserved right adrenal gland. The left is slightly thickened and nodular but also unchanged from prior examination. Musculoskeletal: No chest wall abnormality. No acute or significant osseous findings. IMPRESSION: No significant interval change. Stable right upper lobe central mass lesion with involvement of the hilum and right side of the mediastinum. Stable small mediastinal nodes a tiny lung nodules. Stable left adrenal nodular area which is low-density. Fatty liver infiltration with heterogeneous nodular enhancement in the left hepatic lobe. Again favor a benign process. This has been present since at least 2022. Aortic Atherosclerosis (ICD10-I70.0) and Emphysema (ICD10-J43.9). Electronically Signed   By: Karen Kays M.D.   On: 10/11/2022 15:44    ASSESSMENT AND PLAN: This is a very pleasant 47 years old African-American female diagnosed with a stage IIIC (T4, N3, M0) non-small cell lung cancer, adenocarcinoma presented with large right upper lobe lung mass with narrowing and occlusion of the right pulmonary artery superior trunk as well as narrowing of the interlobular right pulmonary artery and encasement and narrowing of the right upper lobe bronchus in addition to right hilar and mediastinal lymphadenopathy and suspicious liver lesion. The patient had molecular studies by [employer] that showed no actionable mutation. She had a PET scan performed last week and that showed no evidence for metastatic disease outside the chest but there was evidence of metastatic disease to the left supraclavicular area. The patient underwent a course of concurrent chemoradiation with weekly carboplatin for AUC of 2 and paclitaxel 45 Mg/M2.  Status post 6 cycles.  Last dose was given on 03/02/2021.  Follow-up CT scan of the chest after the induction treatment  showed significant decrease in the size of the right lung mass.  The patient is currently undergoing consolidation treatment with immunotherapy with Imfinzi 1500 Mg IV every 4 weeks status post 8 cycles. The patient was tolerating her treatment well but unfortunately she was lost to follow-up since June 2023 for the oral months and her treatment was discontinued. She is currently on observation.  She is feeling fine with no concerning complaints. She had repeat CT scan of the chest performed recently.  I personally and independently reviewed the scan and discussed the result with the patient today. Her scan showed no concerning findings for disease recurrence or metastasis. I recommended for her to continue on observation with repeat CT scan of the chest and head in 4 months. The patient was advised to call immediately if she has any other concerning symptoms in the interval. The patient voices understanding of current disease status and treatment options and is in agreement with the current care plan.  All questions were answered. The patient knows to call the clinic with any problems, questions or concerns.  We can certainly see the patient much sooner if necessary.   Disclaimer: This note was dictated with voice recognition software. Similar sounding words can inadvertently be transcribed and may not be corrected upon review.

## 2023-01-23 IMAGING — CT CT CHEST W/O CM
2 of 4 series · 15 of 36 positions shown, 18 images · non-contrast
Comparison: None.

CLINICAL DATA: Midsternal chest pain, fatigue, abnormal chest x-ray

EXAM:
CT CHEST WITHOUT CONTRAST
TECHNIQUE: Multidetector CT imaging of the chest was performed following the
standard protocol without IV contrast.

[Series 3: chest wo · axial · 0.73mm/px · z∈[+1264,+1522]mm · 12 of 153 slices shown, 15 images]
[im 12/153  mediastinal]
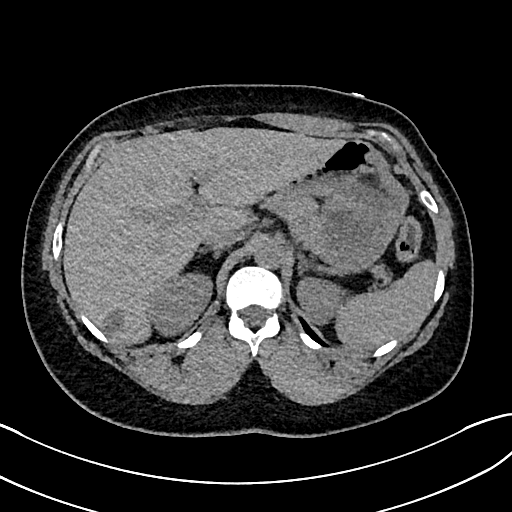
[im 12/153  lung]
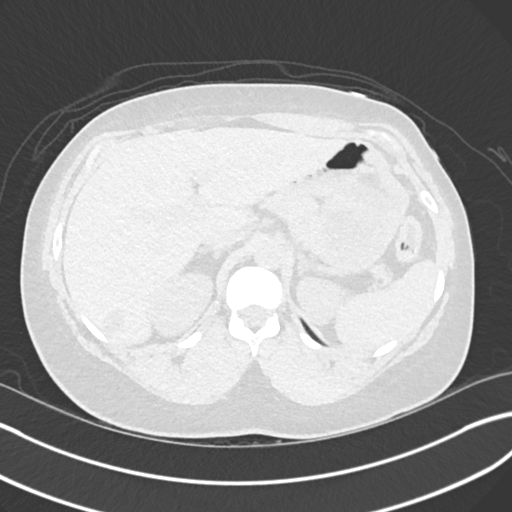
[im 24/153  lung]
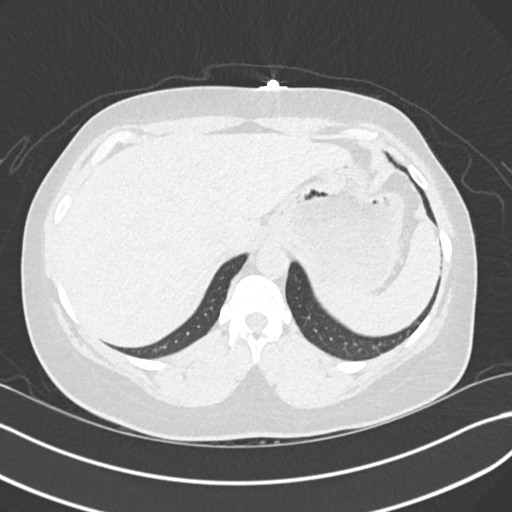
[im 36/153  lung]
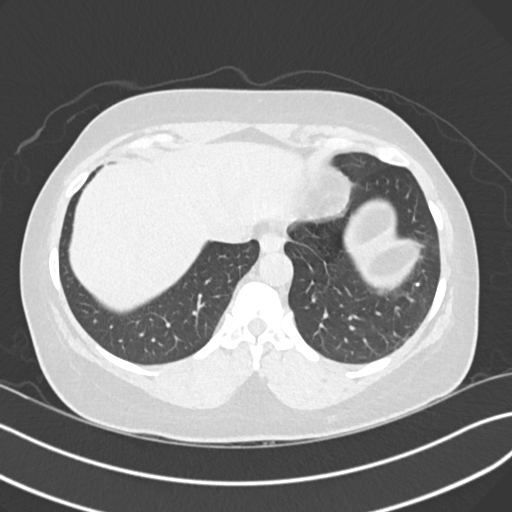
[im 47/153  lung]
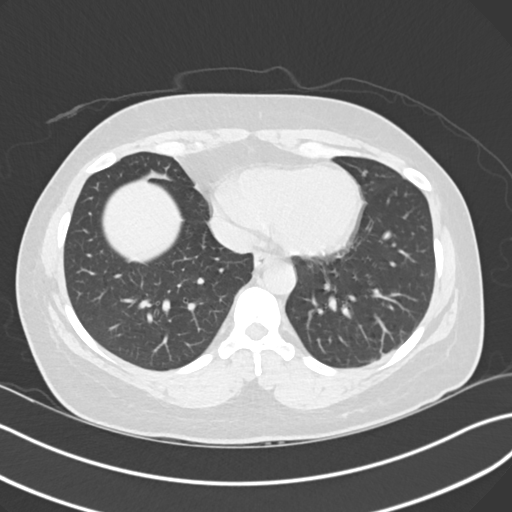
[im 59/153  mediastinal]
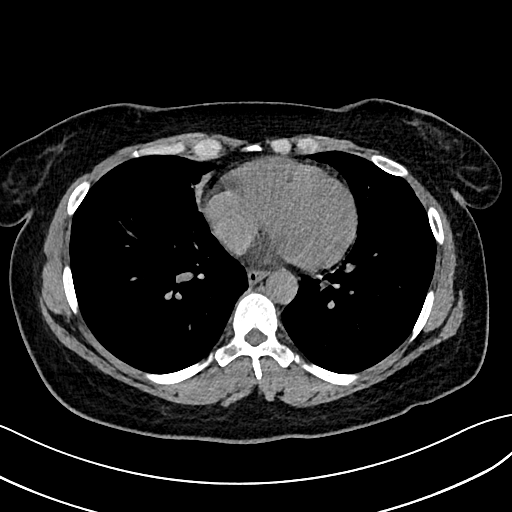
[im 59/153  lung]
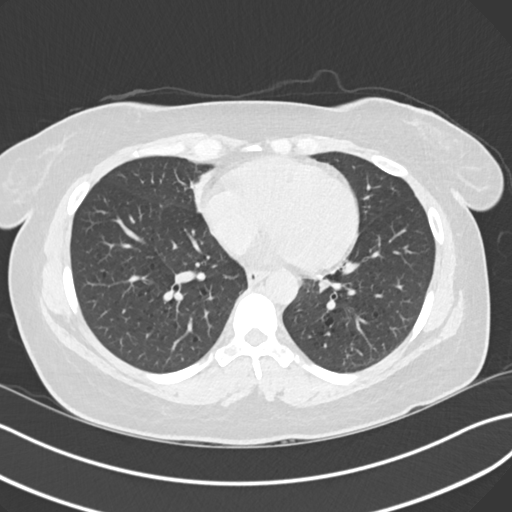
[im 71/153  lung]
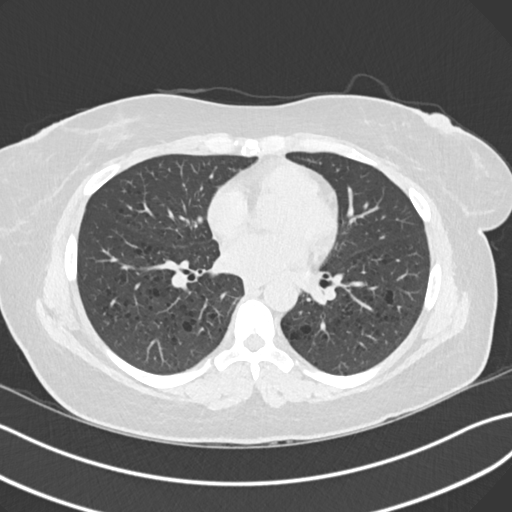
[im 82/153  lung]
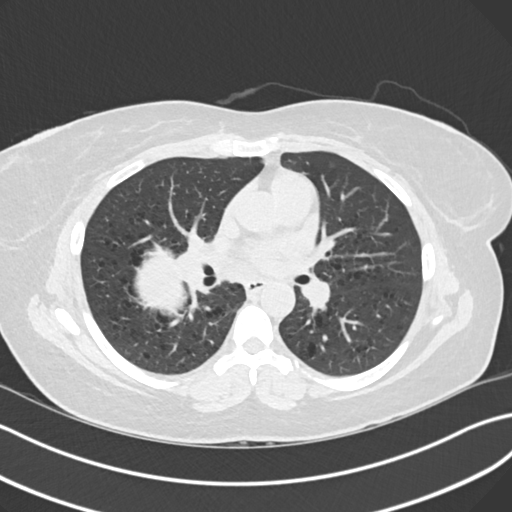
[im 94/153  lung]
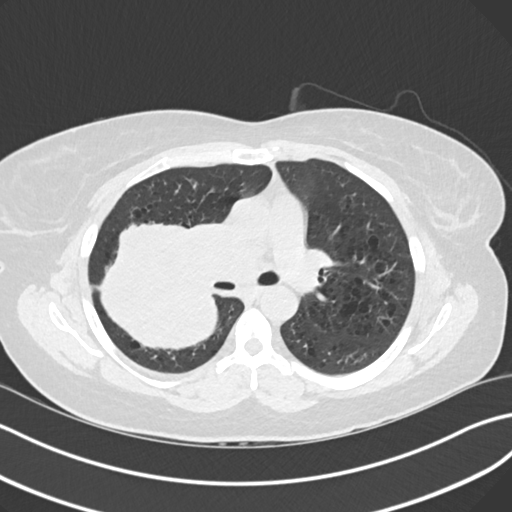
[im 106/153  mediastinal]
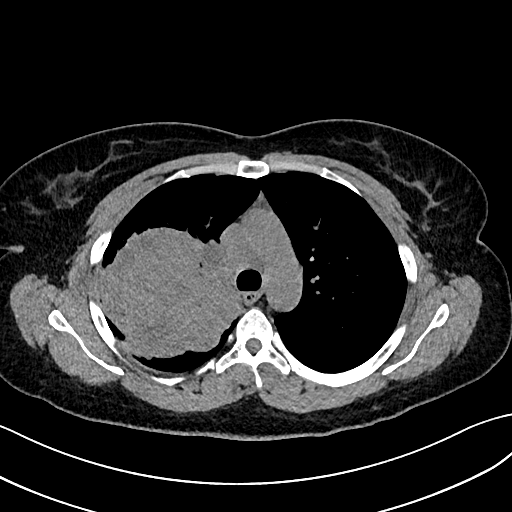
[im 106/153  lung]
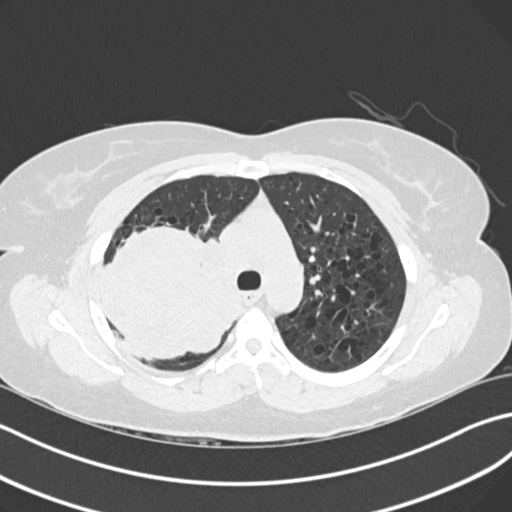
[im 117/153  lung]
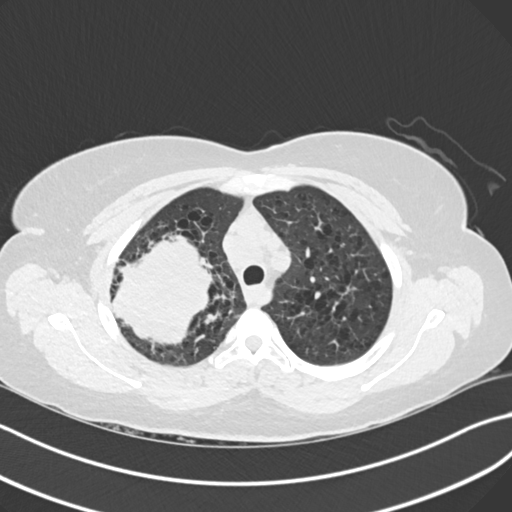
[im 129/153  lung]
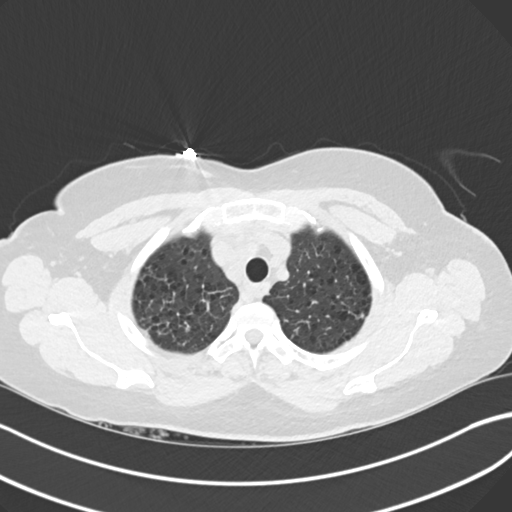
[im 141/153  lung]
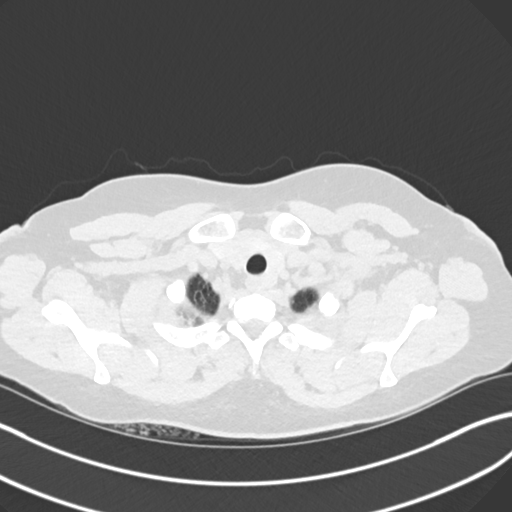

[Series 6: cor · coronal · 0.59mm/px · 3 of 134 slices shown]
[im 27/134  lung]
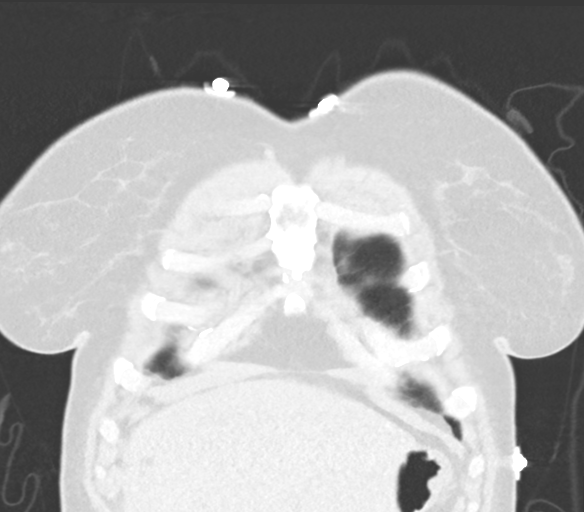
[im 54/134  lung]
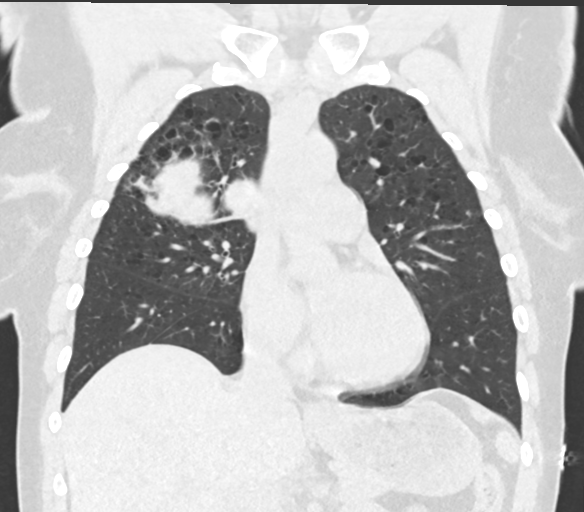
[im 80/134  lung]
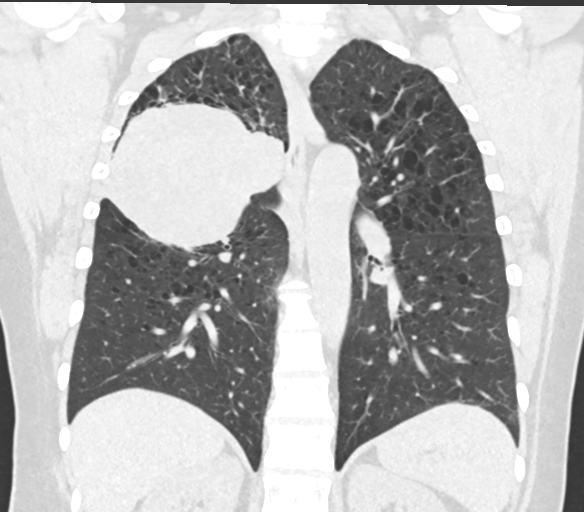

[15 of 36 positions shown; findings below may reference images not displayed]

FINDINGS: Cardiovascular: No significant coronary artery calcification. Global
cardiac size within normal limits. No pericardial effusion. The
central pulmonary arteries are of normal caliber. Aberrant right
subclavian artery noted. The thoracic aorta is otherwise
unremarkable.

Mediastinum/Nodes: There is pathologic right paratracheal adenopathy
and color fluid right hilar adenopathy with the central mass within
the right upper lobe. Exact measurement is difficult given
noncontrast technique. The esophagus is unremarkable. The thyroid is
unremarkable.

Lungs/Pleura: There is apically predominant cystic lung disease
which likely reflects changes of moderate centrilobular emphysema.
There is a right upper lobe mass which is confluent with the right
hilum and encases the right upper lobe pulmonary bronchus measuring
9.7 x 10.0 x 8.3 cm in greatest dimension on axial image # 49/4 and
coronal image # 81/6 suspicious for a primary bronchogenic neoplasm.
No pneumothorax or pleural effusion. The posterior segmental branch
of the right upper lobe appears obliterated by the mass. The airways
are otherwise widely patent.

Upper Abdomen: Several scattered hypodensities are noted within the
visualized right hepatic lobe including a 2.1 cm nodule within the a
posterior right hepatic lobe at axial image # 140/3. This is not
well characterized on this examination, however, hepatic metastasis
is not excluded. No acute abnormality identified.

Musculoskeletal: No acute bone abnormality. No lytic or blastic bone
lesion identified.
IMPRESSION: 10.0 cm right upper lobe pulmonary mass obliterating the posterior
segmental bronchus of the right upper lobe and encasing the right
upper lobe pulmonary bronchus centrally suspicious for a primary
bronchogenic malignancy. Associated confluent right hilar and right
paratracheal adenopathy. Indeterminate lesion within the right
hepatic lobe noted and hepatic metastasis is not excluded. CT
examination of the chest, abdomen, and pelvis with contrast would be
helpful for initial staging purposes. PET CT examination may also be
helpful both for staging purposes as well as determination of a
tissue sampling strategy.

Cystic lung disease most in keeping with moderate centrilobular
emphysema.

Emphysema ([id number]).

## 2023-02-07 ENCOUNTER — Inpatient Hospital Stay: Payer: Self-pay | Attending: Internal Medicine

## 2023-02-07 DIAGNOSIS — C3490 Malignant neoplasm of unspecified part of unspecified bronchus or lung: Secondary | ICD-10-CM

## 2023-02-07 DIAGNOSIS — C3411 Malignant neoplasm of upper lobe, right bronchus or lung: Secondary | ICD-10-CM | POA: Insufficient documentation

## 2023-02-07 LAB — CMP (CANCER CENTER ONLY)
ALT: 16 U/L (ref 0–44)
AST: 17 U/L (ref 15–41)
Albumin: 4.6 g/dL (ref 3.5–5.0)
Alkaline Phosphatase: 102 U/L (ref 38–126)
Anion gap: 6 (ref 5–15)
BUN: 10 mg/dL (ref 6–20)
CO2: 25 mmol/L (ref 22–32)
Calcium: 9.9 mg/dL (ref 8.9–10.3)
Chloride: 108 mmol/L (ref 98–111)
Creatinine: 0.76 mg/dL (ref 0.44–1.00)
GFR, Estimated: >60 mL/min (ref >60)
Glucose, Bld: 125 mg/dL — ABNORMAL HIGH (ref 70–99)
Potassium: 4.2 mmol/L (ref 3.5–5.1)
Sodium: 139 mmol/L (ref 135–145)
Total Bilirubin: 0.3 mg/dL (ref 0.3–1.2)
Total Protein: 8.2 g/dL — ABNORMAL HIGH (ref 6.5–8.1)

## 2023-02-07 LAB — CBC WITH DIFFERENTIAL (CANCER CENTER ONLY)
Abs Immature Granulocytes: 0.03 10*3/uL (ref 0.00–0.07)
Basophils Absolute: 0.1 10*3/uL (ref 0.0–0.1)
Basophils Relative: 1 %
Eosinophils Absolute: 0.3 10*3/uL (ref 0.0–0.5)
Eosinophils Relative: 3 %
HCT: 40.2 % (ref 36.0–46.0)
Hemoglobin: 13.5 g/dL (ref 12.0–15.0)
Immature Granulocytes: 0 %
Lymphocytes Relative: 19 %
Lymphs Abs: 1.8 10*3/uL (ref 0.7–4.0)
MCH: 29.3 pg (ref 26.0–34.0)
MCHC: 33.6 g/dL (ref 30.0–36.0)
MCV: 87.2 fL (ref 80.0–100.0)
Monocytes Absolute: 0.5 10*3/uL (ref 0.1–1.0)
Monocytes Relative: 5 %
Neutro Abs: 7.2 10*3/uL (ref 1.7–7.7)
Neutrophils Relative %: 72 %
Platelet Count: 319 10*3/uL (ref 150–400)
RBC: 4.61 MIL/uL (ref 3.87–5.11)
RDW: 13.7 % (ref 11.5–15.5)
WBC Count: 10 10*3/uL (ref 4.0–10.5)
nRBC: 0 % (ref 0.0–0.2)

## 2023-02-08 ENCOUNTER — Telehealth: Payer: Self-pay | Admitting: Internal Medicine

## 2023-02-08 ENCOUNTER — Ambulatory Visit (HOSPITAL_COMMUNITY)
Admission: RE | Admit: 2023-02-08 | Discharge: 2023-02-08 | Disposition: A | Discharge status: Home / Self Care | Payer: Self-pay | Source: Ambulatory Visit | Attending: Internal Medicine | Admitting: Internal Medicine

## 2023-02-08 DIAGNOSIS — C3490 Malignant neoplasm of unspecified part of unspecified bronchus or lung: Secondary | ICD-10-CM | POA: Insufficient documentation

## 2023-02-08 MED ORDER — IOHEXOL 300 MG/ML  SOLN
75.0000 mL | Freq: Once | INTRAMUSCULAR | Status: AC | PRN
  Administered 2023-02-08: 75 mL via INTRAVENOUS

## 2023-02-08 NOTE — Telephone Encounter (Signed)
Called patient several times to inform her that 08/28 appointment will be cancelled due to scans not being read on time. Voicemail is full.

## 2023-02-09 ENCOUNTER — Inpatient Hospital Stay: Payer: Self-pay | Admitting: Internal Medicine

## 2023-02-12 NOTE — Progress Notes (Signed)
[facility] OFFICE PROGRESS NOTE  Koirala, Dibas, MD [address] [place] [address]  DIAGNOSIS: stage IIIC (T4, N3, M0) non-small cell lung cancer, adenocarcinoma presented with large right upper lobe lung mass with associated narrowing and occlusion of the right pulmonary artery superior trunk as well as narrowing of the interlobular right pulmonary artery and encasement and narrowing of the right upper lobe bronchus in addition to right hilar and subcarinal lymphadenopathy and suspicious lesion in the liver.  This was diagnosed in August 2022.   Molecular studies by [employer] showed no actionable mutations.  PRIOR THERAPY: 1) Concurrent chemoradiation with weekly carboplatin for AUC of 2 and paclitaxel 45 Mg/M2.  Status post 6 cycles.  Last dose was given on 03/02/2021. 2) Consolidation treatment with immunotherapy with Imfinzi 1500 Mg IV every 4 weeks.  First dose April 06, 2021.  Status post 8 cycles.  CURRENT THERAPY: Observation.   INTERVAL HISTORY: Abigail Clark 47 y.o. female returns to the clinic today for follow-up visit for a 60-month follow-up visit.  She was last seen by Dr. Arbutus Ped on 10/12/2022.  She is currently on observation for history of lung cancer feeling well.  She denies any fever, chills, night sweats, or unexplained weight loss.  Denies any chest pain, shortness of breath, cough, or hemoptysis.  Denies any nausea, vomiting, diarrhea, or constipation. She may have mild constipation which is managed with drinking juice. Denies any headaches. She was having blurry vision and had her eyes checked. She was told she needed reading glasses. She recently had a restaging CT scan of the head and chest.  She is here today for evaluation to review her scan results.   MEDICAL HISTORY: Past Medical History:  Diagnosis Date   Cataract    RIGHT EYE   Eczema    Exotropia of right eye 11/2014   History of radiation therapy    Right Lung-  01/26/21-03/09/21- Dr. Antony Blackbird   Irritable bowel syndrome (IBS)    no current med.   Lactose intolerance     ALLERGIES:  is allergic to penicillins.  MEDICATIONS:  Current Outpatient Medications  Medication Sig Dispense Refill   cholecalciferol (VITAMIN D3) 25 MCG (1000 UNIT) tablet Take 1,000 Units by mouth daily.     Multiple Vitamin (MULTIVITAMIN) tablet Take 1 tablet by mouth daily.     omeprazole (PRILOSEC) 20 MG capsule Take 20 mg by mouth 2 (two) times daily as needed (indigestion).     Turmeric 500 MG CAPS Take 1 capsule by mouth daily.     prochlorperazine (COMPAZINE) 10 MG tablet Take 1 tablet (10 mg total) by mouth every 6 (six) hours as needed for nausea or vomiting. (Patient not taking: Reported on 04/02/2021) 30 tablet 0   vitamin E 1000 UNIT capsule Take 1,000 Units by mouth daily. (Patient not taking: Reported on 02/17/2023)     No current facility-administered medications for this visit.    SURGICAL HISTORY:  Past Surgical History:  Procedure Laterality Date   BRONCHIAL BIOPSY  01/12/2021   Procedure: BRONCHIAL BIOPSIES;  Surgeon: Charlott Holler, MD;  Location: [facility] ENDOSCOPY;  Service: Pulmonary;;   BRONCHIAL NEEDLE ASPIRATION BIOPSY  01/12/2021   Procedure: BRONCHIAL NEEDLE ASPIRATION BIOPSIES;  Surgeon: Charlott Holler, MD;  Location: [facility] ENDOSCOPY;  Service: Pulmonary;;   CATARACT PEDIATRIC Right 1986   LEEP  05/20/2006   LIPOSUCTION  10/2014   abd. - local anes.   STRABISMUS SURGERY Right 11/29/2014  Procedure: REPAIR STRABISMUS RIGHT EYE ;  Surgeon: Verne Carrow, MD;  Location: [facility];  Service: Ophthalmology;  Laterality: Right;   VIDEO BRONCHOSCOPY WITH ENDOBRONCHIAL ULTRASOUND N/A 01/12/2021   Procedure: VIDEO BRONCHOSCOPY WITH ENDOBRONCHIAL ULTRASOUND;  Surgeon: Charlott Holler, MD;  Location: [facility] ENDOSCOPY;  Service: Pulmonary;  Laterality: N/A;   WISDOM TOOTH EXTRACTION      REVIEW OF SYSTEMS:   Review of Systems  Constitutional:  Negative for appetite change, chills, fatigue, fever and unexpected weight change.  HENT: Negative for mouth sores, nosebleeds, sore throat and trouble swallowing.   Eyes: Negative for eye problems and icterus.  Respiratory: Negative for cough, hemoptysis, shortness of breath and wheezing.   Cardiovascular: Negative for chest pain and leg swelling.  Gastrointestinal: Negative for abdominal pain, constipation, diarrhea, nausea and vomiting.  Genitourinary: Negative for bladder incontinence, difficulty urinating, dysuria, frequency and hematuria.   Musculoskeletal: Negative for back pain, gait problem, neck pain and neck stiffness.  Skin: Negative for itching and rash.  Neurological: Negative for dizziness, extremity weakness, gait problem, headaches, light-headedness and seizures.  Hematological: Negative for adenopathy. Does not bruise/bleed easily.  Psychiatric/Behavioral: Negative for confusion, depression and sleep disturbance. The patient is not nervous/anxious.     PHYSICAL EXAMINATION:  Blood pressure 112/82, pulse 62, temperature 97.9 F (36.6 C), resp. rate 20, weight 241 lb 12.8 oz (109.7 kg), SpO2 99%.  ECOG PERFORMANCE STATUS: 1  Physical Exam  Constitutional: Oriented to person, place, and time and well-developed, well-nourished, and in no distress.  HENT:  Head: Normocephalic and atraumatic.  Mouth/Throat: Oropharynx is clear and moist. No oropharyngeal exudate.  Eyes: Conjunctivae are normal. Right eye exhibits no discharge. Left eye exhibits no discharge. No scleral icterus.  Neck: Normal range of motion. Neck supple.  Cardiovascular: Normal rate, regular rhythm, normal heart sounds and intact distal pulses.   Pulmonary/Chest: Effort normal and breath sounds normal. No respiratory distress. No wheezes. No rales.  Abdominal: Soft. Bowel sounds are normal. Exhibits no distension and no mass. There is no tenderness.  Musculoskeletal: Normal range of motion. Exhibits no  edema.  Lymphadenopathy:    No cervical adenopathy.  Neurological: Alert and oriented to person, place, and time. Exhibits normal muscle tone. Gait normal. Coordination normal.  Skin: Skin is warm and dry. No rash noted. Not diaphoretic. No erythema. No pallor.  Psychiatric: Mood, memory and judgment normal.  Vitals reviewed.  LABORATORY DATA: Lab Results  Component Value Date   WBC 10.0 02/07/2023   HGB 13.5 02/07/2023   HCT 40.2 02/07/2023   MCV 87.2 02/07/2023   PLT 319 02/07/2023      Chemistry      Component Value Date/Time   NA 139 02/07/2023 1104   K 4.2 02/07/2023 1104   CL 108 02/07/2023 1104   CO2 25 02/07/2023 1104   BUN 10 02/07/2023 1104   CREATININE 0.76 02/07/2023 1104      Component Value Date/Time   CALCIUM 9.9 02/07/2023 1104   ALKPHOS 102 02/07/2023 1104   AST 17 02/07/2023 1104   ALT 16 02/07/2023 1104   BILITOT 0.3 02/07/2023 1104       RADIOGRAPHIC STUDIES:  CT HEAD W & WO CONTRAST ( )  Result Date: 02/16/2023 CLINICAL DATA:  Non-small cell lung cancer (NSCLC), staging EXAM: CT HEAD WITHOUT AND WITH CONTRAST TECHNIQUE: Contiguous axial images were obtained from the base of the skull through the vertex without and with intravenous contrast. RADIATION DOSE REDUCTION: This exam was performed according to  the departmental dose-optimization program which includes automated exposure control, adjustment of the mA and/or kV according to patient size and/or use of iterative reconstruction technique. CONTRAST:  75mL OMNIPAQUE IOHEXOL 300 MG/ML  SOLN COMPARISON:  MRI head 01/27/2021. FINDINGS: Brain: No evidence of acute infarction, hemorrhage, hydrocephalus, extra-axial collection or obvious mass lesion/mass effect. No pathologic in the isthmic. Vascular: No hyperdense vessel or unexpected calcification. Visible vessels are patent. Skull: No acute fracture. Sinuses/Orbits: No acute finding. IMPRESSION: No obvious evidence of acute abnormality or metastatic  disease. If the patient is able, MRI head with contrast could provide far more sensitive evaluation. Electronically Signed   By: Feliberto Harts M.D.   On: 02/16/2023 15:42   CT Chest W Contrast  Result Date: 02/14/2023 CLINICAL DATA:  Follow-up non-small cell lung cancer EXAM: CT CHEST WITH CONTRAST TECHNIQUE: Multidetector CT imaging of the chest was performed during intravenous contrast administration. RADIATION DOSE REDUCTION: This exam was performed according to the departmental dose-optimization program which includes automated exposure control, adjustment of the mA and/or kV according to patient size and/or use of iterative reconstruction technique. CONTRAST:  75mL OMNIPAQUE IOHEXOL 300 MG/ML  SOLN COMPARISON:  10/07/2022 FINDINGS: Cardiovascular: The heart is normal in size. No pericardial effusion. No evidence of thoracic aortic aneurysm. Aberrant right subclavian artery. Mediastinum/Nodes: 18 mm right hilar node (series 2/image 80), previously 17 mm, unchanged. Visualized thyroid is notable for an 11 mm right thyroid nodule (series 2/image 46). Not clinically significant; no follow-up imaging recommended (ref: J Am Coll Radiol. 2015 Feb;12(2): 143-50). Lungs/Pleura: 4.3 x 2.8 cm posterior right upper lobe/perihilar mass (series 6/image 75), previously 4.0 x 3.6 cm, grossly unchanged. Mass extends to the right hilar region. Moderate centrilobular and paraseptal emphysematous changes, upper lung predominant. Mild lingular opacity favors scarring/atelectasis rather than a true nodule (series 6/image 135), although this warrants attention on follow-up. No new/suspicious pulmonary nodules. 6 mm nodule in the posterior right upper lobe (series 6/image 50), unchanged. 5 mm subpleural nodule in the left upper lobe (series 6/image 72). No pleural effusion or pneumothorax. Upper Abdomen: Visualized upper abdomen is notable for scattered hepatic cysts and a stable dominant enhancing lesion in segment 4 (series  2/image 104) which is poorly visualized on the current study due to phase of contrast. Musculoskeletal: Visualized osseous structures are within normal limits. IMPRESSION: 4.3 cm posterior right upper lobe/perihilar mass, grossly unchanged. 18 mm right hilar node, unchanged. Additional stable ancillary findings as above. Emphysema (ICD10-J43.9). Electronically Signed   By: Charline Bills M.D.   On: 02/14/2023 04:43     ASSESSMENT/PLAN:  This is a very pleasant 47 year old African-American female diagnosed with stage IIIc (T4, N3, M0) non-small cell lung cancer, adenocarcinoma.  The patient presented with a right upper lobe lung mass and narrowing and occlusion of the right pulmonary artery superior trunk as well as narrowing of the intralobular right pulmonary artery and encasement and narrowing of the right upper lobe bronchus in addition to right hilar mediastinal lymphadenopathy.  She also has a suspicious liver lesion.  The patient's molecular studies by [employer] showed no actionable mutations.   The patient completed concurrent chemoradiation with carboplatin for an AUC of 2 and paclitaxel 45 mg per metered square. She is status post 6 cycles. The last dose of treatment was on 03/02/2021.   The patient is currently undergoing consolidation treatment with immunotherapy with Imfinzi 1500 Mg IV every 4 weeks status post 8 cycles. The patient was tolerating her treatment well but unfortunately she was lost  to follow-up since June 2023 -October 2023 and her treatment was discontinued.   She is currently on observation and feeling well  She recently had a restaging CT scan performed.  The patient was seen with Dr. Arbutus Ped today.  Dr. Arbutus Ped personally independently reviewed the scan and discussed results with the patient today.  The scan did not show any evidence of disease progression.   Dr. Arbutus Ped recommended that she continue on observation with a repeat scan in 4 months.  Will see her back  for follow-up visit at that time to review her scan results  The patient was advised to call immediately if she has any concerning symptoms in the interval. The patient voices understanding of current disease status and treatment options and is in agreement with the current care plan. All questions were answered. The patient knows to call the clinic with any problems, questions or concerns. We can certainly see the patient much sooner if necessary     Orders Placed This Encounter  Procedures   CT Chest W Contrast    Standing Status:   Future    Standing Expiration Date:   02/17/2024    Order Specific Question:   If indicated for the ordered procedure, I authorize the administration of contrast media per Radiology protocol    Answer:   Yes    Order Specific Question:   Does the patient have a contrast media/X-ray dye allergy?    Answer:   No    Order Specific Question:   Is patient pregnant?    Answer:   No    Order Specific Question:   Preferred imaging location?    Answer:   [facility]   CBC with Differential (Cancer Center Only)    Standing Status:   Future    Standing Expiration Date:   02/17/2024   CMP (Cancer Center only)    Standing Status:   Future    Standing Expiration Date:   02/17/2024      Johnette Abraham Brazen Domangue, PA-C 02/17/23  ADDENDUM: Hematology/Oncology Attending: I had a face-to-face encounter with the patient.  I reviewed her record, lab, scan and recommended her care plan.  This is a very pleasant 47 years old African-American female with history of stage IIIc non-small cell lung cancer, adenocarcinoma diagnosed in August 2022 with no actionable mutations.  She is status post a course of concurrent chemoradiation with weekly carboplatin and paclitaxel with partial response.  The patient started consolidation treatment with immunotherapy with Imfinzi status post 8 cycles.  Her treatment was discontinued after the patient missed several appointments for her  treatment.  She is currently on observation and she is feeling fine with no concerning complaints. She had repeat CT scan of the chest performed recently.  I personally and independently reviewed the scan and discussed the result with the patient today.  Her scan showed no concerning findings for progression. I recommended for her to continue on observation with repeat CT scan of the chest in 4 months. The patient was advised to call immediately if she has any other concerning symptoms in the interval. The total time spent in the appointment was 20 minutes. Lajuana Matte, MD

## 2023-02-17 ENCOUNTER — Inpatient Hospital Stay: Payer: Self-pay | Attending: Physician Assistant | Admitting: Physician Assistant

## 2023-02-17 VITALS — BP 112/82 | HR 62 | Temp 97.9°F | Resp 20 | Wt 241.8 lb

## 2023-02-17 DIAGNOSIS — Z9221 Personal history of antineoplastic chemotherapy: Secondary | ICD-10-CM | POA: Insufficient documentation

## 2023-02-17 DIAGNOSIS — C3491 Malignant neoplasm of unspecified part of right bronchus or lung: Secondary | ICD-10-CM

## 2023-02-17 DIAGNOSIS — C3411 Malignant neoplasm of upper lobe, right bronchus or lung: Secondary | ICD-10-CM | POA: Insufficient documentation

## 2023-02-17 DIAGNOSIS — Z923 Personal history of irradiation: Secondary | ICD-10-CM | POA: Insufficient documentation

## 2023-05-09 ENCOUNTER — Encounter: Payer: Self-pay | Admitting: Gastroenterology

## 2023-06-16 ENCOUNTER — Inpatient Hospital Stay: Payer: Self-pay | Attending: Physician Assistant

## 2023-06-16 ENCOUNTER — Ambulatory Visit (HOSPITAL_COMMUNITY)
Admission: RE | Admit: 2023-06-16 | Discharge: 2023-06-16 | Disposition: A | Discharge status: Home / Self Care | Payer: Self-pay | Source: Ambulatory Visit | Attending: Physician Assistant | Admitting: Physician Assistant

## 2023-06-16 DIAGNOSIS — C3411 Malignant neoplasm of upper lobe, right bronchus or lung: Secondary | ICD-10-CM | POA: Insufficient documentation

## 2023-06-16 DIAGNOSIS — R059 Cough, unspecified: Secondary | ICD-10-CM | POA: Insufficient documentation

## 2023-06-16 DIAGNOSIS — R0981 Nasal congestion: Secondary | ICD-10-CM | POA: Insufficient documentation

## 2023-06-16 DIAGNOSIS — C3491 Malignant neoplasm of unspecified part of right bronchus or lung: Secondary | ICD-10-CM

## 2023-06-16 LAB — CBC WITH DIFFERENTIAL (CANCER CENTER ONLY)
Abs Immature Granulocytes: 0.02 10*3/uL (ref 0.00–0.07)
Basophils Absolute: 0.1 10*3/uL (ref 0.0–0.1)
Basophils Relative: 1 %
Eosinophils Absolute: 0.5 10*3/uL (ref 0.0–0.5)
Eosinophils Relative: 6 %
HCT: 42.1 % (ref 36.0–46.0)
Hemoglobin: 14.2 g/dL (ref 12.0–15.0)
Immature Granulocytes: 0 %
Lymphocytes Relative: 20 %
Lymphs Abs: 2 10*3/uL (ref 0.7–4.0)
MCH: 28.9 pg (ref 26.0–34.0)
MCHC: 33.7 g/dL (ref 30.0–36.0)
MCV: 85.7 fL (ref 80.0–100.0)
Monocytes Absolute: 0.6 10*3/uL (ref 0.1–1.0)
Monocytes Relative: 6 %
Neutro Abs: 6.6 10*3/uL (ref 1.7–7.7)
Neutrophils Relative %: 67 %
Platelet Count: 313 10*3/uL (ref 150–400)
RBC: 4.91 MIL/uL (ref 3.87–5.11)
RDW: 13.9 % (ref 11.5–15.5)
WBC Count: 9.7 10*3/uL (ref 4.0–10.5)
nRBC: 0 % (ref 0.0–0.2)

## 2023-06-16 LAB — CMP (CANCER CENTER ONLY)
ALT: 13 U/L (ref 0–44)
AST: 13 U/L — ABNORMAL LOW (ref 15–41)
Albumin: 4.6 g/dL (ref 3.5–5.0)
Alkaline Phosphatase: 108 U/L (ref 38–126)
Anion gap: 8 (ref 5–15)
BUN: 11 mg/dL (ref 6–20)
CO2: 26 mmol/L (ref 22–32)
Calcium: 10 mg/dL (ref 8.9–10.3)
Chloride: 107 mmol/L (ref 98–111)
Creatinine: 0.81 mg/dL (ref 0.44–1.00)
GFR, Estimated: >60 mL/min (ref >60)
Glucose, Bld: 133 mg/dL — ABNORMAL HIGH (ref 70–99)
Potassium: 4 mmol/L (ref 3.5–5.1)
Sodium: 141 mmol/L (ref 135–145)
Total Bilirubin: 0.4 mg/dL (ref 0.0–1.2)
Total Protein: 8.3 g/dL — ABNORMAL HIGH (ref 6.5–8.1)

## 2023-06-16 MED ORDER — IOHEXOL 300 MG/ML  SOLN
75.0000 mL | Freq: Once | INTRAMUSCULAR | Status: AC | PRN
  Administered 2023-06-16: 75 mL via INTRAVENOUS

## 2023-06-20 NOTE — Progress Notes (Signed)
 [facility] OFFICE PROGRESS NOTE  Koirala, Dibas, MD [address] [place] [address]  DIAGNOSIS: stage IIIC (T4, N3, M0) non-small cell lung cancer, adenocarcinoma presented with large right upper lobe lung mass with associated narrowing and occlusion of the right pulmonary artery superior trunk as well as narrowing of the interlobular right pulmonary artery and encasement and narrowing of the right upper lobe bronchus in addition to right hilar and subcarinal lymphadenopathy and suspicious lesion in the liver.  This was diagnosed in August 2022.   Molecular studies by [employer] showed no actionable mutations.  PRIOR THERAPY: 1) Concurrent chemoradiation with weekly carboplatin  for AUC of 2 and paclitaxel  45 Mg/M2.  Status post 6 cycles.  Last dose was given on 03/02/2021. 2) Consolidation treatment with immunotherapy with Imfinzi  1500 Mg IV every 4 weeks.  First dose April 06, 2021.  Status post 8 cycles.  CURRENT THERAPY: Observation   INTERVAL HISTORY: Abigail Clark 48 y.o. female returns to the clinic today for a follow-up visit for a 34-month follow-up visit.  She was last seen by Dr. Sherrod and myself on 02/17/23.  She overall has been feeling well in the interval but unfortunately she did get a cold from her grandson and she is not feeling well today.  She is reporting that it started as a sore throat and nasal congestion.  She has somewhat of a cough and some sneezing.  Her cough produces clear mucus.  She checked herself for COVID but she did not check herself for flu.  She took her grandson to an urgent care over the weekend and they recommended symptomatic management.  He overall started feeling better but she started feeling unwell.    She denies any fever, night sweats, or unexplained weight loss.  Denies any chest pain or hemoptysis.  Denies any nausea or vomiting.  She sometimes has mild diarrhea which is self-limiting. Denies any headaches.  She recently had a restaging CT scan of the head and chest.  She is here today for evaluation to review her scan results.   MEDICAL HISTORY: Past Medical History:  Diagnosis Date   Cataract    RIGHT EYE   Eczema    Exotropia of right eye 11/2014   History of radiation therapy    Right Lung- 01/26/21-03/09/21- Dr. Lynwood Nasuti   Irritable bowel syndrome (IBS)    no current med.   Lactose intolerance     ALLERGIES:  is allergic to penicillins.  MEDICATIONS:  Current Outpatient Medications  Medication Sig Dispense Refill   azithromycin  (ZITHROMAX ) 250 MG tablet Take as directed 6 each 0   benzonatate  (TESSALON ) 100 MG capsule Take 1 capsule (100 mg total) by mouth 3 (three) times daily as needed. 30 capsule 0   cholecalciferol  (VITAMIN D3) 25 MCG (1000 UNIT) tablet Take 1,000 Units by mouth daily.     Multiple Vitamin (MULTIVITAMIN) tablet Take 1 tablet by mouth daily.     omeprazole (PRILOSEC) 20 MG capsule Take 20 mg by mouth 2 (two) times daily as needed (indigestion).     prochlorperazine  (COMPAZINE ) 10 MG tablet Take 1 tablet (10 mg total) by mouth every 6 (six) hours as needed for nausea or vomiting. 30 tablet 0   Turmeric 500 MG CAPS Take 1 capsule by mouth daily.     vitamin E 1000 UNIT capsule Take 1,000 Units by mouth daily.     No current facility-administered medications for this visit.    SURGICAL  HISTORY:  Past Surgical History:  Procedure Laterality Date   BRONCHIAL BIOPSY  01/12/2021   Procedure: BRONCHIAL BIOPSIES;  Surgeon: Meade Verdon RAMAN, MD;  Location: [facility] ENDOSCOPY;  Service: Pulmonary;;   BRONCHIAL NEEDLE ASPIRATION BIOPSY  01/12/2021   Procedure: BRONCHIAL NEEDLE ASPIRATION BIOPSIES;  Surgeon: Meade Verdon RAMAN, MD;  Location: [facility] ENDOSCOPY;  Service: Pulmonary;;   CATARACT PEDIATRIC Right 1986   LEEP  05/20/2006   LIPOSUCTION  10/2014   abd. - local anes.   STRABISMUS SURGERY Right 11/29/2014   Procedure: REPAIR STRABISMUS RIGHT EYE ;  Surgeon: Elsie Salt,  MD;  Location: [facility];  Service: Ophthalmology;  Laterality: Right;   VIDEO BRONCHOSCOPY WITH ENDOBRONCHIAL ULTRASOUND N/A 01/12/2021   Procedure: VIDEO BRONCHOSCOPY WITH ENDOBRONCHIAL ULTRASOUND;  Surgeon: Meade Verdon RAMAN, MD;  Location: [facility] ENDOSCOPY;  Service: Pulmonary;  Laterality: N/A;   WISDOM TOOTH EXTRACTION      REVIEW OF SYSTEMS:   Review of Systems  Constitutional: Positive for fatigue, weakness, generalized fatigue.  Possible chills.  Negative for fever.  HENT: Positive for improved sore throat.  Negative for mouth sores, nosebleeds, and trouble swallowing.   Eyes: Negative for eye problems and icterus.  Respiratory: Positive for cough.  Negative for hemoptysis, shortness of breath and wheezing.   Cardiovascular: Negative for chest pain and leg swelling.  Gastrointestinal: Negative for abdominal pain, constipation, diarrhea, nausea and vomiting.  Genitourinary: Negative for bladder incontinence, difficulty urinating, dysuria, frequency and hematuria.   Musculoskeletal: Negative for back pain, gait problem, neck pain and neck stiffness.  Skin: Negative for itching and rash.  Neurological: Negative for dizziness, extremity weakness, gait problem, headaches, light-headedness and seizures.  Hematological: Negative for adenopathy. Does not bruise/bleed easily.  Psychiatric/Behavioral: Negative for confusion, depression and sleep disturbance. The patient is not nervous/anxious.     PHYSICAL EXAMINATION:  Blood pressure (!) 101/46, pulse (!) 103, temperature 98.6 F (37 C), temperature source Temporal, resp. rate 16, height 5' 7 (1.702 m), weight 236 lb 12.8 oz (107.4 kg), SpO2 98%.  ECOG PERFORMANCE STATUS: 1  Physical Exam  Constitutional: Oriented to person, place, and time and well-developed, well-nourished, and in no distress.  HENT:  Head: Normocephalic and atraumatic.  Mouth/Throat: Oropharynx is clear and moist. No oropharyngeal exudate.  Eyes:  Conjunctivae are normal. Right eye exhibits no discharge. Left eye exhibits no discharge. No scleral icterus.  Neck: Normal range of motion. Neck supple.  Cardiovascular: Normal rate, regular rhythm, normal heart sounds and intact distal pulses.   Pulmonary/Chest: Effort normal and breath sounds normal. No respiratory distress. No wheezes. No rales.  Abdominal: Soft. Bowel sounds are normal. Exhibits no distension and no mass. There is no tenderness.  Musculoskeletal: Normal range of motion. Exhibits no edema.  Lymphadenopathy:    No cervical adenopathy.  Neurological: Alert and oriented to person, place, and time. Exhibits normal muscle tone. Gait normal. Coordination normal.  Skin: Skin is warm and dry. No rash noted. Not diaphoretic. No erythema. No pallor.  Psychiatric: Mood, memory and judgment normal.  Vitals reviewed.  LABORATORY DATA: Lab Results  Component Value Date   WBC 9.7 06/16/2023   HGB 14.2 06/16/2023   HCT 42.1 06/16/2023   MCV 85.7 06/16/2023   PLT 313 06/16/2023      Chemistry      Component Value Date/Time   NA 141 06/16/2023 0858   K 4.0 06/16/2023 0858   CL 107 06/16/2023 0858   CO2 26 06/16/2023 0858   BUN 11 06/16/2023  0858   CREATININE 0.81 06/16/2023 0858      Component Value Date/Time   CALCIUM 10.0 06/16/2023 0858   ALKPHOS 108 06/16/2023 0858   AST 13 (L) 06/16/2023 0858   ALT 13 06/16/2023 0858   BILITOT 0.4 06/16/2023 0858       RADIOGRAPHIC STUDIES:  CT Chest W Contrast Result Date: 06/22/2023 CLINICAL DATA:  Non-small cell lung cancer, status post chemotherapy, XRT complete EXAM: CT CHEST WITH CONTRAST TECHNIQUE: Multidetector CT imaging of the chest was performed during intravenous contrast administration. RADIATION DOSE REDUCTION: This exam was performed according to the departmental dose-optimization program which includes automated exposure control, adjustment of the mA and/or kV according to patient size and/or use of iterative  reconstruction technique. CONTRAST:  75mL OMNIPAQUE  IOHEXOL  300 MG/ML  SOLN COMPARISON:  02/08/2023 FINDINGS: Cardiovascular: The heart is normal in size. No pericardial effusion. No evidence of thoracic aortic aneurysm. Aberrant right subclavian artery. Mediastinum/Nodes: Abnormal soft tissue in the right perihilar region, described below. Otherwise, no suspicious mediastinal lymphadenopathy. Stable bilateral thyroid  nodules measuring up to 14 mm on the right (series 2/image 25). Not clinically significant; no follow-up imaging recommended (ref: J Am Coll Radiol. 2015 Feb;12(2): 143-50). Lungs/Pleura: Radiation changes in the right upper lobe/perihilar region. Underlying masslike soft tissue in the central right upper lobe (series 2/image 83) and extending to the right perihilar region (series 2/image 58) suggests residual viable tumor, although difficult to discretely measure. Otherwise, no suspicious pulmonary nodules. No focal consolidation. Moderate centrilobular and paraseptal emphysematous changes, upper lung predominant. No pleural effusion or pneumothorax. Upper Abdomen: Scattered hepatic cysts with stable altered perfusion/enhancing lesion in segment 4A (series 2/image 140). Musculoskeletal: Visualized osseous structures are within normal limits. IMPRESSION: Radiation changes in the right upper lobe/perihilar region. Underlying masslike soft tissue in the central right upper lobe/perihilar region, difficult to discretely measure, but suspicious for viable tumor. Consider attention on follow-up versus PET-CT. Emphysema (ICD10-J43.9). Electronically Signed   By: Pinkie Pebbles M.D.   On: 06/22/2023 23:15     ASSESSMENT/PLAN:  This is a very pleasant 48 year old African-American female diagnosed with stage IIIc (T4, N3, M0) non-small cell lung cancer, adenocarcinoma.  The patient presented with a right upper lobe lung mass and narrowing and occlusion of the right pulmonary artery superior trunk as  well as narrowing of the intralobular right pulmonary artery and encasement and narrowing of the right upper lobe bronchus in addition to right hilar mediastinal lymphadenopathy.  She also has a suspicious liver lesion.  The patient's molecular studies by [employer] showed no actionable mutations.   The patient completed concurrent chemoradiation with carboplatin  for an AUC of 2 and paclitaxel  45 mg per metered square. She is status post 6 cycles. The last dose of treatment was on 03/02/2021.    The patient is currently undergoing consolidation treatment with immunotherapy with Imfinzi  1500 Mg IV every 4 weeks status post 8 cycles. The patient was tolerating her treatment well but unfortunately she was lost to follow-up since June 2023 -October 2023 and her treatment was discontinued.    She is currently on observation and feeling well  She recently had a restaging CT scan performed.  The patient was seen with Dr. Sherrod today.  Dr. Sherrod personally independently reviewed the scan and discussed results with the patient today.  The scan did not show any evidence of disease progression.    Dr. Sherrod recommended that she continue on observation with a repeat scan in 4 months.   Will see her  back for follow-up visit at that time to review her scan results  Regarding her cold, Dr. Justice does not feel repeat imaging is required since she just had a CT scan that did not show any signs of lung infection.  The patient was advised this is likely viral in nature but given that she has underlying lung disease we will send azithromycin  to the pharmacy.  Otherwise the patient I talked about over-the-counter symptomatic relief.  She may want to consider testing herself for COVID, flu, and RSV if her symptoms do not improve.  I gave her prescription of Tessalon  due to concerns of causing drowsiness with other prescription cough suppressants.  She also can try Robitussin.  In the future, discussed that if she is  ever sick/infectious that it would be best if we reschedule her appointment or converted to a virtual visit in order to prevent the spread of infection due to the immunocompromised patient population at the cancer center.  The patient was advised to call immediately if she has any concerning symptoms in the interval. The patient voices understanding of current disease status and treatment options and is in agreement with the current care plan. All questions were answered. The patient knows to call the clinic with any problems, questions or concerns. We can certainly see the patient much sooner if necessary       Orders Placed This Encounter  Procedures   CT Chest W Contrast    Standing Status:   Future    Expected Date:   10/10/2023    Expiration Date:   06/22/2024    If indicated for the ordered procedure, I authorize the administration of contrast media per Radiology protocol:   Yes    Does the patient have a contrast media/X-ray dye allergy?:   No    Is patient pregnant?:   No    Preferred imaging location?:   [facility]   CBC with Differential (Cancer Center Only)    Standing Status:   Future    Expected Date:   10/10/2023    Expiration Date:   06/22/2024   CMP (Cancer Center only)    Standing Status:   Future    Expected Date:   10/10/2023    Expiration Date:   06/22/2024     Keyron Pokorski L Jymir Dunaj, PA-C 06/23/23  ADDENDUM: Hematology/Oncology Attending: I had a face-to-face encounter with the patient today.  I reviewed her records, lab, scan and recommended her care plan.  This is a very pleasant 48 years old African-American female with a stage IIIc non-small cell lung cancer, adenocarcinoma diagnosed in August 2022 status post a course of concurrent chemoradiation followed by consolidation chemotherapy with Imfinzi  every 4 weeks for 8 cycles discontinued secondary to loss for follow-up.  The patient has been on observation since that time and she is feeling fine except  for recent chest congestion and cold symptoms after she caught the flu from her grandson. She had repeat CT scan of the chest performed recently.  I personally and independently reviewed the scan and discussed the result with the patient today. Her scan showed no concerning findings for disease recurrence or metastasis. I recommended for her to continue on observation with repeat CT scan of the chest in 4 months. For the chest congestion and cough, will give her prescription for Z-Pak. The patient was advised to call immediately if she has any other concerning symptoms in the interval. The total time spent in the appointment was 30 minutes.  Disclaimer: This note was dictated with voice recognition software. Similar sounding words can inadvertently be transcribed and may be missed upon review. Sherrod MARLA Sherrod, MD

## 2023-06-23 ENCOUNTER — Inpatient Hospital Stay (HOSPITAL_BASED_OUTPATIENT_CLINIC_OR_DEPARTMENT_OTHER): Payer: Managed Care, Other (non HMO) | Admitting: Physician Assistant

## 2023-06-23 VITALS — BP 101/46 | HR 103 | Temp 98.6°F | Resp 16 | Ht 67.0 in | Wt 236.8 lb

## 2023-06-23 DIAGNOSIS — C3491 Malignant neoplasm of unspecified part of right bronchus or lung: Secondary | ICD-10-CM

## 2023-06-23 DIAGNOSIS — C3411 Malignant neoplasm of upper lobe, right bronchus or lung: Secondary | ICD-10-CM | POA: Diagnosis not present

## 2023-06-23 MED ORDER — AZITHROMYCIN 250 MG PO TABS: ORAL_TABLET | ORAL | 0 refills | Status: AC

## 2023-06-23 MED ORDER — BENZONATATE 100 MG PO CAPS: 100.0000 mg | ORAL_CAPSULE | Freq: Three times a day (TID) | ORAL | 0 refills | Status: AC | PRN

## 2023-10-10 ENCOUNTER — Encounter (HOSPITAL_COMMUNITY): Payer: Self-pay

## 2023-10-10 ENCOUNTER — Inpatient Hospital Stay: Payer: Managed Care, Other (non HMO) | Attending: Physician Assistant

## 2023-10-10 ENCOUNTER — Ambulatory Visit (HOSPITAL_COMMUNITY)
Admission: RE | Admit: 2023-10-10 | Discharge: 2023-10-10 | Disposition: A | Discharge status: Home / Self Care | Payer: Self-pay | Source: Ambulatory Visit | Attending: Physician Assistant | Admitting: Physician Assistant

## 2023-10-10 DIAGNOSIS — C3491 Malignant neoplasm of unspecified part of right bronchus or lung: Secondary | ICD-10-CM | POA: Insufficient documentation

## 2023-10-10 DIAGNOSIS — C3411 Malignant neoplasm of upper lobe, right bronchus or lung: Secondary | ICD-10-CM | POA: Insufficient documentation

## 2023-10-10 LAB — CMP (CANCER CENTER ONLY)
ALT: 13 U/L (ref 0–44)
AST: 14 U/L — ABNORMAL LOW (ref 15–41)
Albumin: 4.6 g/dL (ref 3.5–5.0)
Alkaline Phosphatase: 92 U/L (ref 38–126)
Anion gap: 7 (ref 5–15)
BUN: 8 mg/dL (ref 6–20)
CO2: 28 mmol/L (ref 22–32)
Calcium: 9.8 mg/dL (ref 8.9–10.3)
Chloride: 103 mmol/L (ref 98–111)
Creatinine: 0.86 mg/dL (ref 0.44–1.00)
GFR, Estimated: >60 mL/min (ref >60)
Glucose, Bld: 130 mg/dL — ABNORMAL HIGH (ref 70–99)
Potassium: 3.9 mmol/L (ref 3.5–5.1)
Sodium: 138 mmol/L (ref 135–145)
Total Bilirubin: 0.6 mg/dL (ref 0.0–1.2)
Total Protein: 8 g/dL (ref 6.5–8.1)

## 2023-10-10 LAB — CBC WITH DIFFERENTIAL (CANCER CENTER ONLY)
Abs Immature Granulocytes: 0.02 10*3/uL (ref 0.00–0.07)
Basophils Absolute: 0.1 10*3/uL (ref 0.0–0.1)
Basophils Relative: 1 %
Eosinophils Absolute: 0.5 10*3/uL (ref 0.0–0.5)
Eosinophils Relative: 5 %
HCT: 40.2 % (ref 36.0–46.0)
Hemoglobin: 13.7 g/dL (ref 12.0–15.0)
Immature Granulocytes: 0 %
Lymphocytes Relative: 14 %
Lymphs Abs: 1.5 10*3/uL (ref 0.7–4.0)
MCH: 29.1 pg (ref 26.0–34.0)
MCHC: 34.1 g/dL (ref 30.0–36.0)
MCV: 85.5 fL (ref 80.0–100.0)
Monocytes Absolute: 0.6 10*3/uL (ref 0.1–1.0)
Monocytes Relative: 6 %
Neutro Abs: 7.8 10*3/uL — ABNORMAL HIGH (ref 1.7–7.7)
Neutrophils Relative %: 74 %
Platelet Count: 316 10*3/uL (ref 150–400)
RBC: 4.7 MIL/uL (ref 3.87–5.11)
RDW: 13.5 % (ref 11.5–15.5)
WBC Count: 10.4 10*3/uL (ref 4.0–10.5)
nRBC: 0 % (ref 0.0–0.2)

## 2023-10-10 MED ORDER — IOHEXOL 300 MG/ML  SOLN
75.0000 mL | Freq: Once | INTRAMUSCULAR | Status: AC | PRN
  Administered 2023-10-10: 75 mL via INTRAVENOUS

## 2023-10-10 MED ORDER — SODIUM CHLORIDE (PF) 0.9 % IJ SOLN
INTRAMUSCULAR | Status: AC
  Filled 2023-10-10: qty 50

## 2023-10-17 ENCOUNTER — Telehealth: Payer: Self-pay

## 2023-10-17 ENCOUNTER — Inpatient Hospital Stay: Payer: Managed Care, Other (non HMO) | Attending: Physician Assistant | Admitting: Internal Medicine

## 2023-10-17 DIAGNOSIS — C3411 Malignant neoplasm of upper lobe, right bronchus or lung: Secondary | ICD-10-CM

## 2023-10-17 DIAGNOSIS — Z9221 Personal history of antineoplastic chemotherapy: Secondary | ICD-10-CM

## 2023-10-17 DIAGNOSIS — C3490 Malignant neoplasm of unspecified part of unspecified bronchus or lung: Secondary | ICD-10-CM

## 2023-10-17 DIAGNOSIS — Z923 Personal history of irradiation: Secondary | ICD-10-CM

## 2023-10-17 NOTE — Progress Notes (Signed)
 [facility] Telephone:([phone]) [phone]   Fax:(336) [phone]  PROGRESS NOTE FOR TELEMEDICINE VISITS  [name], MD [address] [place] [address]  I connected withNAME@ on 10/17/23 at 11:30 AM EDT by telephone visit and verified that I am speaking with the correct person using two identifiers.   I discussed the limitations, risks, security and privacy concerns of performing an evaluation and management service by telemedicine and the availability of in-person appointments. I also discussed with the patient that there may be a patient responsible charge related to this service. The patient expressed understanding and agreed to proceed.  Other persons participating in the visit and their role in the encounter:  None  Patient's location:  Home Provider's location:  [facility]  DIAGNOSIS: stage IIIC (T4, N3, M0) non-small cell lung cancer, adenocarcinoma presented with large right upper lobe lung mass with associated narrowing and occlusion of the right pulmonary artery superior trunk as well as narrowing of the interlobular right pulmonary artery and encasement and narrowing of the right upper lobe bronchus in addition to right hilar and subcarinal lymphadenopathy and suspicious lesion in the liver.  This was diagnosed in August 2022.   Molecular studies by [employer] showed no actionable mutations.   PRIOR THERAPY: 1) Concurrent chemoradiation with weekly carboplatin  for AUC of 2 and paclitaxel  45 Mg/M2.  Status post 6 cycles.  Last dose was given on 03/02/2021. 2) Consolidation treatment with immunotherapy with Imfinzi  1500 Mg IV every 4 weeks.  First dose April 06, 2021.  Status post 8 cycles.   CURRENT THERAPY: Observation   INTERVAL HISTORY: Abigail Clark 48 y.o. female has a telephone virtual visit with me today for evaluation and discussion of her scan results.Discussed the use of AI scribe software for clinical note  transcription with the patient, who gave verbal consent to proceed.  History of Present Illness   Abigail Clark is a 48 year old female with stage IIIC non-small cell lung cancer who presents for a virtual visit for restaging of her disease.  She has a history of stage IIIC non-small cell lung cancer, adenocarcinoma, diagnosed in August 2022. She completed concurrent chemoradiation followed by eight cycles of consolidation immunotherapy with durvalumab , which was discontinued in June 2023. Since then, she has been under observation.  No new complaints or symptoms of concern over the past six months. She experiences occasional shortness of breath. No chest pain, cough, hemoptysis, nausea, vomiting, diarrhea, or weight loss.  A CT scan of the chest performed on October 10, 2023, showed stable findings with no new or progressive disease in the chest. An area in the liver, monitored since August 2022, showed no change in activity.      MEDICAL HISTORY: Past Medical History:  Diagnosis Date   Cataract    RIGHT EYE   Eczema    Exotropia of right eye 11/2014   History of radiation therapy    Right Lung- 01/26/21-03/09/21- Dr. Retta Caster   Irritable bowel syndrome (IBS)    no current med.   Lactose intolerance     ALLERGIES:  is allergic to penicillins.  MEDICATIONS:  Current Outpatient Medications  Medication Sig Dispense Refill   azithromycin  (ZITHROMAX ) 250 MG tablet Take as directed 6 each 0   benzonatate  (TESSALON ) 100 MG capsule Take 1 capsule (100 mg total) by mouth 3 (three) times daily as needed. 30 capsule 0   cholecalciferol  (VITAMIN D3) 25 MCG (1000 UNIT) tablet Take  1,000 Units by mouth daily.     Multiple Vitamin (MULTIVITAMIN) tablet Take 1 tablet by mouth daily.     omeprazole (PRILOSEC) 20 MG capsule Take 20 mg by mouth 2 (two) times daily as needed (indigestion).     prochlorperazine  (COMPAZINE ) 10 MG tablet Take 1 tablet (10 mg total) by mouth every 6 (six) hours as  needed for nausea or vomiting. 30 tablet 0   Turmeric 500 MG CAPS Take 1 capsule by mouth daily.     vitamin E 1000 UNIT capsule Take 1,000 Units by mouth daily.     No current facility-administered medications for this visit.    SURGICAL HISTORY:  Past Surgical History:  Procedure Laterality Date   BRONCHIAL BIOPSY  01/12/2021   Procedure: BRONCHIAL BIOPSIES;  Surgeon: Aleck Hurdle, MD;  Location: [facility] ENDOSCOPY;  Service: Pulmonary;;   BRONCHIAL NEEDLE ASPIRATION BIOPSY  01/12/2021   Procedure: BRONCHIAL NEEDLE ASPIRATION BIOPSIES;  Surgeon: Aleck Hurdle, MD;  Location: [facility] ENDOSCOPY;  Service: Pulmonary;;   CATARACT PEDIATRIC Right 1986   LEEP  05/20/2006   LIPOSUCTION  10/2014   abd. - local anes.   STRABISMUS SURGERY Right 11/29/2014   Procedure: REPAIR STRABISMUS RIGHT EYE ;  Surgeon: Dorothey Gate, MD;  Location: [facility];  Service: Ophthalmology;  Laterality: Right;   VIDEO BRONCHOSCOPY WITH ENDOBRONCHIAL ULTRASOUND N/A 01/12/2021   Procedure: VIDEO BRONCHOSCOPY WITH ENDOBRONCHIAL ULTRASOUND;  Surgeon: Aleck Hurdle, MD;  Location: [facility] ENDOSCOPY;  Service: Pulmonary;  Laterality: N/A;   WISDOM TOOTH EXTRACTION      REVIEW OF SYSTEMS:  A comprehensive review of systems was negative.    LABORATORY DATA: Lab Results  Component Value Date   WBC 10.4 10/10/2023   HGB 13.7 10/10/2023   HCT 40.2 10/10/2023   MCV 85.5 10/10/2023   PLT 316 10/10/2023      Chemistry      Component Value Date/Time   NA 138 10/10/2023 0850   K 3.9 10/10/2023 0850   CL 103 10/10/2023 0850   CO2 28 10/10/2023 0850   BUN 8 10/10/2023 0850   CREATININE 0.86 10/10/2023 0850      Component Value Date/Time   CALCIUM 9.8 10/10/2023 0850   ALKPHOS 92 10/10/2023 0850   AST 14 (L) 10/10/2023 0850   ALT 13 10/10/2023 0850   BILITOT 0.6 10/10/2023 0850       RADIOGRAPHIC STUDIES: CT Chest W Contrast Result Date: 10/15/2023 CLINICAL DATA:  Non-small-cell lung cancer. Restaging. *  Tracking Code: BO * EXAM: CT CHEST WITH CONTRAST TECHNIQUE: Multidetector CT imaging of the chest was performed during intravenous contrast administration. RADIATION DOSE REDUCTION: This exam was performed according to the departmental dose-optimization program which includes automated exposure control, adjustment of the mA and/or kV according to patient size and/or use of iterative reconstruction technique. CONTRAST:  75mL OMNIPAQUE  IOHEXOL  300 MG/ML  SOLN COMPARISON:  06/16/2023 FINDINGS: Cardiovascular: The heart size is normal. No substantial pericardial effusion. No thoracic aortic aneurysm. No substantial atherosclerosis of the thoracic aorta. Aberrant origin right subclavian artery noted. Mediastinum/Nodes: 1.5 cm right thyroid  nodule evident. 9 mm nodule identified left thyroid  gland. Similar soft tissue fullness in the right hilar region. No left hilar lymphadenopathy. No mediastinal lymphadenopathy. The esophagus has normal imaging features. There is no axillary lymphadenopathy. Lungs/Pleura: Centrilobular and paraseptal emphysema evident. Post treatment scarring in the suprahilar right upper lung is stable in the interval 5 mm posterior right upper lobe nodule on 33/7 is unchanged. Stable 5  mm posterior left upper lobe nodule on 47/7 no pulmonary edema or pleural effusion. No new suspicious pulmonary nodule or mass. Upper Abdomen: Multiple low-density liver lesions again identified, some of which are too small to characterize. Imaging features most compatible with cyst. No followup imaging is recommended. 4.2 cm subtle enhancing lesion identified in the left liver (139/2) is stable in unchanged comparing back to exams from 01/12/2021. stable nodular thickening left adrenal gland. Musculoskeletal: No worrisome lytic or sclerotic osseous abnormality. IMPRESSION: 1. No new or progressive findings to suggest recurrent or metastatic disease in the chest. 2. Stable post treatment scarring in the suprahilar  right upper lung. 3. Stable 4.2 cm subtle enhancing lesion in the left liver comparing back to exams from 01/12/2021. 4. Stable nodular thickening left adrenal gland. 5. Right thyroid  nodule now measures 1.5 cm diameter. Recommend thyroid  US  (ref: J Am Coll Radiol. 2015 Feb;12(2): 143-50). 6. Aortic Atherosclerosis (ICD10-I70.0) and Emphysema (ICD10-J43.9). Electronically Signed   By: Donnal Fusi M.D.   On: 10/15/2023 07:34    ASSESSMENT AND PLAN: This is a very pleasant 48 years old African-American female diagnosed with a stage IIIC (T4, N3, M0) non-small cell lung cancer, adenocarcinoma presented with large right upper lobe lung mass with narrowing and occlusion of the right pulmonary artery superior trunk as well as narrowing of the interlobular right pulmonary artery and encasement and narrowing of the right upper lobe bronchus in addition to right hilar and mediastinal lymphadenopathy and suspicious liver lesion. The patient had molecular studies by [employer] that showed no actionable mutation. She had a PET scan performed last week and that showed no evidence for metastatic disease outside the chest but there was evidence of metastatic disease to the left supraclavicular area. The patient underwent a course of concurrent chemoradiation with weekly carboplatin  for AUC of 2 and paclitaxel  45 Mg/M2.  Status post 6 cycles.  Last dose was given on 03/02/2021.  Follow-up CT scan of the chest after the induction treatment showed significant decrease in the size of the right lung mass.  The patient underwent consolidation treatment with immunotherapy with Imfinzi  1500 Mg IV every 4 weeks status post 8 cycles. The patient was tolerating her treatment well but unfortunately she was lost to follow-up since June 2023 and her treatment was discontinued. She had repeat CT scan of the chest performed recently.  I personally and independently reviewed the scan and discussed the result with the patient today.  Her  scan showed no concerning findings for disease recurrence or metastasis. Assessment and Plan    Lung cancer, adenocarcinoma Stage III C non-small cell lung cancer, adenocarcinoma, diagnosed in August 2022. Status post concurrent chemoradiation and eight cycles of consolidation treatment with durvalumab , discontinued in June 2023. Currently under observation. Recent CT scan of the chest on October 10, 2023, shows no new or progressive findings suggestive of recurrence or metastasis. Liver lesion remains unchanged since August 2022. No new symptoms reported, and no evidence of disease progression. - Schedule follow-up appointment in six months with repeat blood work and CT scan. - Advise to maintain physical activity and a healthy diet. - Instruct to report any concerning changes immediately.   The patient was advised to call immediately if she has any other concerning symptoms in the interval.  I discussed the assessment and treatment plan with the patient. The patient was provided an opportunity to ask questions and all were answered. The patient agreed with the plan and demonstrated an understanding of the instructions.  The patient was advised to call back or seek an in-person evaluation if the symptoms worsen or if the condition fails to improve as anticipated.  I provided 20 minutes of non face-to-face telephone visit time during this encounter, and > 50% was spent counseling as documented under my assessment & plan.  Aurelio Blower, MD 10/17/2023 12:00 PM  Disclaimer: This note was dictated with voice recognition software. Similar sounding words can inadvertently be transcribed and may not be corrected upon review.

## 2023-10-17 NOTE — Telephone Encounter (Signed)
 TC from patient requesting appt change to telephone visit.  Okay per Dr. Marguerita Shih. Patient verbalized understanding.

## 2024-04-09 ENCOUNTER — Inpatient Hospital Stay: Payer: Self-pay | Attending: Internal Medicine

## 2024-04-09 ENCOUNTER — Ambulatory Visit (HOSPITAL_COMMUNITY)
Admission: RE | Admit: 2024-04-09 | Discharge: 2024-04-09 | Disposition: A | Discharge status: Home / Self Care | Payer: Self-pay | Source: Ambulatory Visit | Attending: Internal Medicine | Admitting: Internal Medicine

## 2024-04-09 DIAGNOSIS — C3490 Malignant neoplasm of unspecified part of unspecified bronchus or lung: Secondary | ICD-10-CM | POA: Insufficient documentation

## 2024-04-09 DIAGNOSIS — Z85118 Personal history of other malignant neoplasm of bronchus and lung: Secondary | ICD-10-CM | POA: Insufficient documentation

## 2024-04-09 LAB — CMP (CANCER CENTER ONLY)
ALT: 14 U/L (ref 0–44)
AST: 15 U/L (ref 15–41)
Albumin: 4.3 g/dL (ref 3.5–5.0)
Alkaline Phosphatase: 100 U/L (ref 38–126)
Anion gap: 7 (ref 5–15)
BUN: 12 mg/dL (ref 6–20)
CO2: 26 mmol/L (ref 22–32)
Calcium: 9.4 mg/dL (ref 8.9–10.3)
Chloride: 108 mmol/L (ref 98–111)
Creatinine: 0.83 mg/dL (ref 0.44–1.00)
GFR, Estimated: >60 mL/min (ref >60)
Glucose, Bld: 103 mg/dL — ABNORMAL HIGH (ref 70–99)
Potassium: 4 mmol/L (ref 3.5–5.1)
Sodium: 141 mmol/L (ref 135–145)
Total Bilirubin: 0.3 mg/dL (ref 0.0–1.2)
Total Protein: 7.7 g/dL (ref 6.5–8.1)

## 2024-04-09 LAB — CBC WITH DIFFERENTIAL (CANCER CENTER ONLY)
Abs Immature Granulocytes: 0.03 K/uL (ref 0.00–0.07)
Basophils Absolute: 0.1 K/uL (ref 0.0–0.1)
Basophils Relative: 1 %
Eosinophils Absolute: 0.3 K/uL (ref 0.0–0.5)
Eosinophils Relative: 3 %
HCT: 38.2 % (ref 36.0–46.0)
Hemoglobin: 13 g/dL (ref 12.0–15.0)
Immature Granulocytes: 0 %
Lymphocytes Relative: 16 %
Lymphs Abs: 1.8 K/uL (ref 0.7–4.0)
MCH: 29.7 pg (ref 26.0–34.0)
MCHC: 34 g/dL (ref 30.0–36.0)
MCV: 87.2 fL (ref 80.0–100.0)
Monocytes Absolute: 0.6 K/uL (ref 0.1–1.0)
Monocytes Relative: 5 %
Neutro Abs: 8.3 K/uL — ABNORMAL HIGH (ref 1.7–7.7)
Neutrophils Relative %: 75 %
Platelet Count: 307 K/uL (ref 150–400)
RBC: 4.38 MIL/uL (ref 3.87–5.11)
RDW: 14.2 % (ref 11.5–15.5)
WBC Count: 11.1 K/uL — ABNORMAL HIGH (ref 4.0–10.5)
nRBC: 0 % (ref 0.0–0.2)

## 2024-04-09 MED ORDER — SODIUM CHLORIDE (PF) 0.9 % IJ SOLN
INTRAMUSCULAR | Status: AC
  Filled 2024-04-09: qty 50

## 2024-04-09 MED ORDER — IOHEXOL 300 MG/ML  SOLN
75.0000 mL | Freq: Once | INTRAMUSCULAR | Status: AC | PRN
  Administered 2024-04-09: 75 mL via INTRAVENOUS

## 2024-04-16 ENCOUNTER — Inpatient Hospital Stay: Payer: Self-pay | Attending: Internal Medicine | Admitting: Internal Medicine

## 2024-04-16 VITALS — BP 122/76 | HR 110 | Temp 97.7°F | Resp 17 | Ht 68.0 in | Wt 226.0 lb

## 2024-04-16 DIAGNOSIS — C3411 Malignant neoplasm of upper lobe, right bronchus or lung: Secondary | ICD-10-CM | POA: Insufficient documentation

## 2024-04-16 DIAGNOSIS — E042 Nontoxic multinodular goiter: Secondary | ICD-10-CM | POA: Insufficient documentation

## 2024-04-16 DIAGNOSIS — Z923 Personal history of irradiation: Secondary | ICD-10-CM | POA: Insufficient documentation

## 2024-04-16 DIAGNOSIS — C3490 Malignant neoplasm of unspecified part of unspecified bronchus or lung: Secondary | ICD-10-CM

## 2024-04-16 DIAGNOSIS — Z9221 Personal history of antineoplastic chemotherapy: Secondary | ICD-10-CM | POA: Insufficient documentation

## 2024-04-16 NOTE — Progress Notes (Signed)
 [facility] Telephone:(336) [phone]   Fax:(336) [phone]  OFFICE PROGRESS NOTE  Clark, Dibas, MD [address] [place] [address]  DIAGNOSIS: stage IIIC (T4, N3, M0) non-small cell lung cancer, adenocarcinoma presented with large right upper lobe lung mass with associated narrowing and occlusion of the right pulmonary artery superior trunk as well as narrowing of the interlobular right pulmonary artery and encasement and narrowing of the right upper lobe bronchus in addition to right hilar and subcarinal lymphadenopathy and suspicious lesion in the liver.  This was diagnosed in August 2022.  Molecular studies by [employer] showed no actionable mutations.  PRIOR THERAPY:  1) Concurrent chemoradiation with weekly carboplatin  for AUC of 2 and paclitaxel  45 Mg/M2.  Status post 6 cycles.  Last dose was given on 03/02/2021. 2) Consolidation treatment with immunotherapy with Imfinzi  1500 Mg IV every 4 weeks.  First dose April 06, 2021.  Status post 8 cycles.  CURRENT THERAPY: Observation.  INTERVAL HISTORY: Abigail Clark 48 y.o. female returns to the clinic today for 64-month follow-up visit.Discussed the use of AI scribe software for clinical note transcription with the patient, who gave verbal consent to proceed.  History of Present Illness DONDI Clark is a 48 year old female with stage three small cell lung cancer who presents for evaluation with repeat CT scan for restaging of her disease.  Diagnosed with stage three small cell lung cancer in August 2022, she underwent concurrent chemoradiation with weekly carboplatin  and paclitaxel , followed by eight cycles of consolidation treatment with durvalumab . Treatment was discontinued due to several missed appointments, and she has been on observation since then.  She has no new symptoms over the past six months, including no chest pain, coughing, or hemoptysis. She experiences occasional back pain  but has not had nausea, vomiting, or diarrhea. She has intentionally changed her eating habits to lose weight, as she felt it was affecting her breathing.  A recent CT scan shows a small area in the left lung, which has increased slightly from six millimeters to seven millimeters since the last scan. It remains below the detection threshold for a PET scan.  She inquires about her thyroid , mentioning nodules. A right thyroid  nodule measuring 1.5 centimeters is noted, which is unchanged from previous evaluations.    MEDICAL HISTORY: Past Medical History:  Diagnosis Date   Cataract    RIGHT EYE   Eczema    Exotropia of right eye 11/2014   History of radiation therapy    Right Lung- 01/26/21-03/09/21- Dr. Lynwood Nasuti   Irritable bowel syndrome (IBS)    no current med.   Lactose intolerance     ALLERGIES:  is allergic to penicillins.  MEDICATIONS:  Current Outpatient Medications  Medication Sig Dispense Refill   azithromycin  (ZITHROMAX ) 250 MG tablet Take as directed 6 each 0   benzonatate  (TESSALON ) 100 MG capsule Take 1 capsule (100 mg total) by mouth 3 (three) times daily as needed. 30 capsule 0   cholecalciferol  (VITAMIN D3) 25 MCG (1000 UNIT) tablet Take 1,000 Units by mouth daily.     Multiple Vitamin (MULTIVITAMIN) tablet Take 1 tablet by mouth daily.     omeprazole (PRILOSEC) 20 MG capsule Take 20 mg by mouth 2 (two) times daily as needed (indigestion).     prochlorperazine  (COMPAZINE ) 10 MG tablet Take 1 tablet (10 mg total) by mouth every 6 (six) hours as needed for nausea or vomiting. 30 tablet 0  Turmeric 500 MG CAPS Take 1 capsule by mouth daily.     vitamin E 1000 UNIT capsule Take 1,000 Units by mouth daily.     No current facility-administered medications for this visit.    SURGICAL HISTORY:  Past Surgical History:  Procedure Laterality Date   BRONCHIAL BIOPSY  01/12/2021   Procedure: BRONCHIAL BIOPSIES;  Surgeon: Meade Verdon RAMAN, MD;  Location: [facility] ENDOSCOPY;   Service: Pulmonary;;   BRONCHIAL NEEDLE ASPIRATION BIOPSY  01/12/2021   Procedure: BRONCHIAL NEEDLE ASPIRATION BIOPSIES;  Surgeon: Meade Verdon RAMAN, MD;  Location: [facility] ENDOSCOPY;  Service: Pulmonary;;   CATARACT PEDIATRIC Right 1986   LEEP  05/20/2006   LIPOSUCTION  10/2014   abd. - local anes.   STRABISMUS SURGERY Right 11/29/2014   Procedure: REPAIR STRABISMUS RIGHT EYE ;  Surgeon: Elsie Salt, MD;  Location: [facility];  Service: Ophthalmology;  Laterality: Right;   VIDEO BRONCHOSCOPY WITH ENDOBRONCHIAL ULTRASOUND N/A 01/12/2021   Procedure: VIDEO BRONCHOSCOPY WITH ENDOBRONCHIAL ULTRASOUND;  Surgeon: Meade Verdon RAMAN, MD;  Location: [facility] ENDOSCOPY;  Service: Pulmonary;  Laterality: N/A;   WISDOM TOOTH EXTRACTION      REVIEW OF SYSTEMS:  A comprehensive review of systems was negative except for: Constitutional: positive for fatigue   PHYSICAL EXAMINATION: General appearance: alert, cooperative, fatigued, and no distress Head: Normocephalic, without obvious abnormality, atraumatic Neck: no adenopathy, no JVD, supple, symmetrical, trachea midline, and thyroid  not enlarged, symmetric, no tenderness/mass/nodules Lymph nodes: Cervical, supraclavicular, and axillary nodes normal. Resp: clear to auscultation bilaterally Back: symmetric, no curvature. ROM normal. No CVA tenderness. Cardio: regular rate and rhythm, S1, S2 normal, no murmur, click, rub or gallop GI: soft, non-tender; bowel sounds normal; no masses,  no organomegaly Extremities: extremities normal, atraumatic, no cyanosis or edema  ECOG PERFORMANCE STATUS: 0 - Asymptomatic  Blood pressure 122/76, pulse (!) 110, temperature 97.7 F (36.5 C), temperature source Temporal, resp. rate 17, height 5' 8 (1.727 m), weight 226 lb (102.5 kg), SpO2 97%.  LABORATORY DATA: Lab Results  Component Value Date   WBC 11.1 (H) 04/09/2024   HGB 13.0 04/09/2024   HCT 38.2 04/09/2024   MCV 87.2 04/09/2024   PLT 307 04/09/2024       Chemistry      Component Value Date/Time   NA 141 04/09/2024 0942   K 4.0 04/09/2024 0942   CL 108 04/09/2024 0942   CO2 26 04/09/2024 0942   BUN 12 04/09/2024 0942   CREATININE 0.83 04/09/2024 0942      Component Value Date/Time   CALCIUM 9.4 04/09/2024 0942   ALKPHOS 100 04/09/2024 0942   AST 15 04/09/2024 0942   ALT 14 04/09/2024 0942   BILITOT 0.3 04/09/2024 0942       RADIOGRAPHIC STUDIES: CT Chest W Contrast Result Date: 04/10/2024 CLINICAL DATA:  Right upper lobe adenocarcinoma status post chemoradiation and immunotherapy. Surveillance. * Tracking Code: BO * EXAM: CT CHEST WITH CONTRAST TECHNIQUE: Multidetector CT imaging of the chest was performed during intravenous contrast administration. RADIATION DOSE REDUCTION: This exam was performed according to the departmental dose-optimization program which includes automated exposure control, adjustment of the mA and/or kV according to patient size and/or use of iterative reconstruction technique. CONTRAST:  75mL OMNIPAQUE  IOHEXOL  300 MG/ML  SOLN COMPARISON:  CT chest dated 10/10/2023 FINDINGS: Cardiovascular: Normal heart size. No significant pericardial fluid/thickening. Left arch with aberrant right subclavian artery. No central pulmonary emboli. Mediastinum/Nodes: Again seen is a 1.5 cm right thyroid  nodule (2:25). Normal esophagus. No pathologically  enlarged axillary, supraclavicular, mediastinal, or hilar lymph nodes. Lungs/Pleura: The central airways are patent. Similar right hilar posttreatment changes. Upper lobe predominant centrilobular emphysema. Slightly increased size of anterior left upper lobe irregular nodule measuring 7 x 6 mm (6:48), previously 6 x 4 mm. Additional scattered irregular nodules are unchanged. Reference nodules: -5 mm posterior right upper lobe (6:30) -5 mm posterior left upper lobe (6:43) no pneumothorax. No pleural effusion. Upper abdomen: 3.7 cm enhancing focus within segment 2/4 of the liver (2:41),  previously 4.2 cm. Additional scattered hypodensities, many of which are too small to characterize, likely cysts. Stable left adrenal thickening. Musculoskeletal: No acute or abnormal lytic or blastic osseous lesions. IMPRESSION: 1. Slightly increased size of an indeterminate anterior left upper lobe irregular nodule measuring 7 x 6 mm, previously 6 x 4 mm. 2. Additional scattered irregular nodules are unchanged. 3. Similar right hilar posttreatment changes. 4. Slightly decreased size of enhancing focus within segment 2/4 of the liver. 5. Unchanged 1.5 cm right thyroid  nodule. Recommend thyroid  US  (ref: J Am Coll Radiol. 2015 Feb;12(2): 143-50). 6.  Emphysema (ICD10-J43.9). Electronically Signed   By: Limin  Xu M.D.   On: 04/10/2024 16:27    ASSESSMENT AND PLAN: This is a very pleasant 48 years old African-American female diagnosed with a stage IIIC (T4, N3, M0) non-small cell lung cancer, adenocarcinoma presented with large right upper lobe lung mass with narrowing and occlusion of the right pulmonary artery superior trunk as well as narrowing of the interlobular right pulmonary artery and encasement and narrowing of the right upper lobe bronchus in addition to right hilar and mediastinal lymphadenopathy and suspicious liver lesion. The patient had molecular studies by [employer] that showed no actionable mutation. She had a PET scan performed last week and that showed no evidence for metastatic disease outside the chest but there was evidence of metastatic disease to the left supraclavicular area. The patient underwent a course of concurrent chemoradiation with weekly carboplatin  for AUC of 2 and paclitaxel  45 Mg/M2.  Status post 6 cycles.  Last dose was given on 03/02/2021.  Follow-up CT scan of the chest after the induction treatment showed significant decrease in the size of the right lung mass.  The patient underwent consolidation treatment with immunotherapy with Imfinzi  1500 Mg IV every 4 weeks  status post 8 cycles. The patient was tolerating her treatment well but unfortunately she was lost to follow-up since June 2023 for the oral months and her treatment was discontinued. She is currently on observation.  She is feeling fine with no concerning complaints. She had repeat CT scan of the chest performed recently.  I personally and independently reviewed the scan images and discussed the result and showed the images to the patient today.  Her scan showed no concerning findings for disease progression but there was continuous slight increase in the size of left upper lobe pulmonary nodule increased by 1-2 mm. Assessment and Plan Assessment & Plan Stage III small cell lung cancer of the left lower lobe under surveillance Stage III small cell lung cancer diagnosed in August 2022. Underwent concurrent chemoradiation with carboplatin  and paclitaxel , followed by consolidation immunotherapy with durvalumab . Treatment discontinued due to missed appointments. Currently under observation. Recent CT scan shows a 7 mm lesion in the left lung, slightly increased from 6 mm, but still below PET scan detection threshold. No symptoms such as chest pain, hemoptysis, or respiratory issues reported. - Continue surveillance with repeat CT scan in six months.  Stable right thyroid   nodule under evaluation Right thyroid  nodule measuring 1.5 cm, unchanged in size. No current thyroid  medication. Discussed potential for benign nature of thyroid  nodules. She expressed interest in further investigation. - Ordered thyroid  ultrasound to evaluate the nodule. - If ultrasound is suspicious, will consider fine needle aspiration biopsy. The patient was advised to call immediately if she has any other concerning symptoms in the interval.  The patient voices understanding of current disease status and treatment options and is in agreement with the current care plan.  All questions were answered. The patient knows to call the  clinic with any problems, questions or concerns. We can certainly see the patient much sooner if necessary.   Disclaimer: This note was dictated with voice recognition software. Similar sounding words can inadvertently be transcribed and may not be corrected upon review.

## 2024-04-18 ENCOUNTER — Telehealth: Payer: Self-pay | Admitting: Internal Medicine

## 2024-04-18 NOTE — Telephone Encounter (Signed)
 Scheduled patient for next appointments. Called and tried to inform the patient but I could not reach her. I will try again later.

## 2024-04-25 ENCOUNTER — Ambulatory Visit (HOSPITAL_COMMUNITY): Payer: Self-pay

## 2024-05-04 ENCOUNTER — Ambulatory Visit (HOSPITAL_COMMUNITY)
Admission: RE | Admit: 2024-05-04 | Discharge: 2024-05-04 | Disposition: A | Discharge status: Home / Self Care | Payer: Self-pay | Source: Ambulatory Visit | Attending: Internal Medicine | Admitting: Internal Medicine

## 2024-05-04 DIAGNOSIS — C3490 Malignant neoplasm of unspecified part of unspecified bronchus or lung: Secondary | ICD-10-CM | POA: Insufficient documentation

## 2024-05-04 MED ORDER — IOHEXOL 300 MG/ML  SOLN
75.0000 mL | Freq: Once | INTRAMUSCULAR | Status: AC | PRN
  Administered 2024-05-04: 75 mL via INTRAVENOUS

## 2024-05-04 MED ORDER — SODIUM CHLORIDE (PF) 0.9 % IJ SOLN
INTRAMUSCULAR | Status: AC
  Filled 2024-05-04: qty 50

## 2024-10-08 ENCOUNTER — Inpatient Hospital Stay: Payer: Self-pay

## 2024-10-16 ENCOUNTER — Inpatient Hospital Stay: Payer: Self-pay | Admitting: Internal Medicine
